# Patient Record
Sex: Female | Born: 1945
Health system: Southern US, Community
[De-identification: ages and names within clinical notes are randomized; demographics above are authoritative.]

## PROBLEM LIST (undated history)

## (undated) DIAGNOSIS — E785 Hyperlipidemia, unspecified: Secondary | ICD-10-CM

## (undated) DIAGNOSIS — I10 Essential (primary) hypertension: Secondary | ICD-10-CM

## (undated) DIAGNOSIS — E119 Type 2 diabetes mellitus without complications: Secondary | ICD-10-CM

## (undated) HISTORY — DX: Type 2 diabetes mellitus without complications: E11.9

## (undated) HISTORY — PX: APPENDECTOMY: SHX54

## (undated) HISTORY — DX: Hyperlipidemia, unspecified: E78.5

## (undated) HISTORY — DX: Essential (primary) hypertension: I10

---

## 1992-10-27 HISTORY — PX: ABDOMINAL HYSTERECTOMY: SHX81

## 2003-10-28 HISTORY — PX: LITHOTRIPSY: SUR834

## 2005-10-27 LAB — HM COLONOSCOPY: HM Colonoscopy: NORMAL

## 2008-10-27 HISTORY — PX: PACEMAKER INSERTION: SHX728

## 2009-10-11 DIAGNOSIS — Z950 Presence of cardiac pacemaker: Secondary | ICD-10-CM | POA: Insufficient documentation

## 2009-10-27 HISTORY — PX: CATARACT EXTRACTION: SUR2

## 2014-02-24 LAB — HM DIABETES EYE EXAM

## 2014-06-27 LAB — HM MAMMOGRAPHY: HM Mammogram: NORMAL

## 2015-01-15 LAB — HEMOGLOBIN A1C: Hgb A1c MFr Bld: 7.4 % — AB (ref 4.0–6.0)

## 2015-04-12 DIAGNOSIS — I499 Cardiac arrhythmia, unspecified: Secondary | ICD-10-CM | POA: Insufficient documentation

## 2015-04-12 DIAGNOSIS — I10 Essential (primary) hypertension: Secondary | ICD-10-CM | POA: Insufficient documentation

## 2015-04-12 DIAGNOSIS — E1159 Type 2 diabetes mellitus with other circulatory complications: Secondary | ICD-10-CM | POA: Insufficient documentation

## 2015-04-12 DIAGNOSIS — E785 Hyperlipidemia, unspecified: Secondary | ICD-10-CM

## 2015-04-12 DIAGNOSIS — E118 Type 2 diabetes mellitus with unspecified complications: Secondary | ICD-10-CM | POA: Insufficient documentation

## 2015-04-12 DIAGNOSIS — E1169 Type 2 diabetes mellitus with other specified complication: Secondary | ICD-10-CM | POA: Insufficient documentation

## 2015-04-12 DIAGNOSIS — Z8542 Personal history of malignant neoplasm of other parts of uterus: Secondary | ICD-10-CM | POA: Insufficient documentation

## 2015-04-26 ENCOUNTER — Other Ambulatory Visit: Payer: Self-pay | Admitting: Internal Medicine

## 2015-05-17 ENCOUNTER — Encounter: Payer: Self-pay | Admitting: Internal Medicine

## 2015-05-17 ENCOUNTER — Ambulatory Visit (INDEPENDENT_AMBULATORY_CARE_PROVIDER_SITE_OTHER): Payer: BLUE CROSS/BLUE SHIELD | Admitting: Internal Medicine

## 2015-05-17 VITALS — BP 128/64 | HR 68 | Ht 69.0 in | Wt 215.8 lb

## 2015-05-17 DIAGNOSIS — I471 Supraventricular tachycardia: Secondary | ICD-10-CM | POA: Diagnosis not present

## 2015-05-17 DIAGNOSIS — IMO0002 Reserved for concepts with insufficient information to code with codable children: Secondary | ICD-10-CM

## 2015-05-17 DIAGNOSIS — D485 Neoplasm of uncertain behavior of skin: Secondary | ICD-10-CM | POA: Diagnosis not present

## 2015-05-17 DIAGNOSIS — Z9889 Other specified postprocedural states: Secondary | ICD-10-CM | POA: Insufficient documentation

## 2015-05-17 DIAGNOSIS — I10 Essential (primary) hypertension: Secondary | ICD-10-CM

## 2015-05-17 DIAGNOSIS — E1165 Type 2 diabetes mellitus with hyperglycemia: Secondary | ICD-10-CM | POA: Diagnosis not present

## 2015-05-17 DIAGNOSIS — R9439 Abnormal result of other cardiovascular function study: Secondary | ICD-10-CM | POA: Insufficient documentation

## 2015-05-17 DIAGNOSIS — R001 Bradycardia, unspecified: Secondary | ICD-10-CM | POA: Insufficient documentation

## 2015-05-17 NOTE — Progress Notes (Signed)
Date:  05/17/2015   Name:  Kimberly Salazar   DOB:  1945-11-14   MRN:  [id number]   Chief Complaint: Diabetes Diabetes She presents for her follow-up diabetic visit. She has type 2 diabetes mellitus. Her disease course has been stable. There are no hypoglycemic associated symptoms. Pertinent negatives for diabetes include no chest pain. Symptoms are stable. There are no diabetic complications. She is compliant with treatment most of the time. Her weight is decreasing steadily. She is following a diabetic diet. (Admits to not checking her blood sugar) Eye exam is current.  Hypertension This is a chronic problem. The current episode started more than 1 year ago. The problem is unchanged. The problem is controlled. Pertinent negatives include no chest pain, palpitations or shortness of breath. Risk factors for coronary artery disease include diabetes mellitus and dyslipidemia. Past treatments include beta blockers, angiotensin blockers and diuretics. Hypertensive end-organ damage includes CAD/MI (arrythmia). There is no history of kidney disease.  Rash This is a chronic problem. The current episode started more than 1 month ago. The problem is unchanged (lesion on back of right leg). The rash is characterized by dryness and scaling. Pertinent negatives include no cough, fever or shortness of breath.  She has not had a dermatology evaluation in some time.  She has seen Dr. Magda Salazar in [place] in the past.   Review of Systems:  Review of Systems  Constitutional: Negative for fever, activity change, appetite change and unexpected weight change.  HENT: Negative for hearing loss and trouble swallowing.   Eyes: Negative for visual disturbance.  Respiratory: Negative for cough, chest tightness and shortness of breath.   Cardiovascular: Negative for chest pain, palpitations and leg swelling.  Genitourinary: Negative for hematuria and difficulty urinating.  Musculoskeletal: Negative for joint swelling.  Skin:  Positive for rash.  Neurological: Negative for numbness.    Patient Active Problem List   Diagnosis Date Noted  . Bradycardia 05/17/2015  . Abnormal finding on thallium stress test 05/17/2015  . History of biliary T-tube placement 05/17/2015  . Supraventricular tachycardia 05/17/2015  . Hypercholesteremia 04/12/2015  . Essential (primary) hypertension 04/12/2015  . Cancer of uterus 04/12/2015  . Arrhythmia, sinus node 04/12/2015  . Diabetes mellitus type 2, uncontrolled 04/12/2015    Prior to Admission medications   Medication Sig Start Date End Date Taking? Authorizing Provider  aspirin 81 MG chewable tablet Chew 1 tablet by mouth daily.   Yes Historical Provider, MD  glimepiride (AMARYL) 2 MG tablet Take 1 tablet by mouth 2 (two) times daily. 03/01/15  Yes Historical Provider, MD  glucose blood (ONE TOUCH ULTRA TEST) test strip 1 strip 2 (two) times daily. 10/12/14  Yes Historical Provider, MD  metoprolol succinate (TOPROL-XL) 25 MG 24 hr tablet Take 1 tablet by mouth daily. 09/15/14  Yes Historical Provider, MD  Multiple Vitamins-Minerals (MULTIVITAL) tablet Take 1 tablet by mouth daily.   Yes Historical Provider, MD  olmesartan-hydrochlorothiazide (BENICAR HCT) 20-12.5 MG per tablet Take 1 tablet by mouth daily. 09/15/14  Yes Historical Provider, MD  omega-3 acid ethyl esters (LOVAZA) 1 G capsule TAKE 4 CAPSULES BY MOUTH DAILY 04/26/15  Yes Kimberly Hess, MD  simvastatin (ZOCOR) 80 MG tablet Take 1 tablet by mouth daily. 09/15/14  Yes Historical Provider, MD  SitaGLIPtin-MetFORMIN HCl (JANUMET XR) [phone] MG TB24 Take 1 tablet by mouth daily. 09/15/14  Yes Historical Provider, MD    No Known Allergies  Past Surgical History  Procedure Laterality Date  . Pacemaker insertion  2010  .  Abdominal hysterectomy  1994    Uterine CA  . Lithotripsy  2005  . Appendectomy    . Cataract extraction Left 2011    History  Substance Use Topics  . Smoking status: Never Smoker   .  Smokeless tobacco: Not on file  . Alcohol Use: No     Medication list has been reviewed and updated.  Physical Examination:  Physical Exam  Constitutional: She appears well-developed and well-nourished.  Neck: Normal range of motion. Neck supple. No thyromegaly present.  Cardiovascular: Normal rate, regular rhythm and normal heart sounds.   Pulmonary/Chest: Effort normal. She has no wheezes.  Musculoskeletal: She exhibits no edema or tenderness.  Lymphadenopathy:    She has no cervical adenopathy.  Skin:       BP 128/64 mmHg  Pulse 68  Ht 5\' 9"  (1.753 m)  Wt 215 lb 12.8 oz (97.886 kg)  BMI 31.85 kg/m2  Assessment and Plan: 1. Diabetes mellitus type 2, uncontrolled Improved - continue diet and exercise - Hemoglobin A1c  2. Essential (primary) hypertension Stable on current medication  3. Supraventricular tachycardia Pacemaker; followed by [facility] - Dr Kimberly Salazar  4. Neoplasm of uncertain behavior of skin Recommend Dermatology evaluation   Kimberly Maidens, MD [facility]  05/17/2015

## 2015-05-18 LAB — HEMOGLOBIN A1C
Est. average glucose Bld gHb Est-mCnc: 163 mg/dL
Hgb A1c MFr Bld: 7.3 % — ABNORMAL HIGH (ref 4.8–5.6)

## 2015-08-08 ENCOUNTER — Other Ambulatory Visit: Payer: Self-pay | Admitting: Internal Medicine

## 2015-09-28 ENCOUNTER — Other Ambulatory Visit: Payer: Self-pay | Admitting: Internal Medicine

## 2015-11-19 ENCOUNTER — Ambulatory Visit (INDEPENDENT_AMBULATORY_CARE_PROVIDER_SITE_OTHER): Payer: BLUE CROSS/BLUE SHIELD | Admitting: Internal Medicine

## 2015-11-19 ENCOUNTER — Encounter: Payer: Self-pay | Admitting: Internal Medicine

## 2015-11-19 VITALS — BP 140/90 | HR 64 | Ht 68.0 in | Wt 220.6 lb

## 2015-11-19 DIAGNOSIS — Z1159 Encounter for screening for other viral diseases: Secondary | ICD-10-CM | POA: Diagnosis not present

## 2015-11-19 DIAGNOSIS — Z0000 Encounter for general adult medical examination without abnormal findings: Secondary | ICD-10-CM

## 2015-11-19 DIAGNOSIS — E7800 Pure hypercholesterolemia, unspecified: Secondary | ICD-10-CM | POA: Diagnosis not present

## 2015-11-19 DIAGNOSIS — I10 Essential (primary) hypertension: Secondary | ICD-10-CM | POA: Diagnosis not present

## 2015-11-19 DIAGNOSIS — E1165 Type 2 diabetes mellitus with hyperglycemia: Secondary | ICD-10-CM

## 2015-11-19 DIAGNOSIS — Z23 Encounter for immunization: Secondary | ICD-10-CM | POA: Diagnosis not present

## 2015-11-19 DIAGNOSIS — S336XXA Sprain of sacroiliac joint, initial encounter: Secondary | ICD-10-CM

## 2015-11-19 DIAGNOSIS — IMO0001 Reserved for inherently not codable concepts without codable children: Secondary | ICD-10-CM

## 2015-11-19 LAB — POCT URINALYSIS DIPSTICK
Bilirubin, UA: NEGATIVE
Blood, UA: NEGATIVE
GLUCOSE UA: 1000
KETONES UA: NEGATIVE
LEUKOCYTES UA: NEGATIVE
Nitrite, UA: NEGATIVE
Protein, UA: 100
SPEC GRAV UA: 1.01
Urobilinogen, UA: 0.2
pH, UA: 6

## 2015-11-19 NOTE — Progress Notes (Signed)
Date:  11/19/2015   Name:  Kimberly Salazar   DOB:  1945-12-16   MRN:  [id number]   Chief Complaint: Annual Exam; Hypertension; Diabetes; and Hyperlipidemia Kimberly Salazar is a 70 y.o. female who presents today for her Complete Annual Exam. She feels well. She reports exercising regularly. She reports she is sleeping well.   Hypertension This is a chronic problem. The current episode started more than 1 year ago. The problem is unchanged. The problem is controlled. Pertinent negatives include no chest pain, headaches, palpitations or shortness of breath. Past treatments include angiotensin blockers, diuretics and beta blockers. The current treatment provides significant improvement.  Diabetes She presents for her follow-up diabetic visit. She has type 2 diabetes mellitus. Her disease course has been stable. There are no hypoglycemic associated symptoms. Pertinent negatives for hypoglycemia include no dizziness, headaches or tremors. Pertinent negatives for diabetes include no chest pain, no fatigue, no polydipsia and no polyuria. Symptoms are stable. Current diabetic treatment includes oral agent (triple therapy). She is compliant with treatment all of the time. An ACE inhibitor/angiotensin II receptor blocker is being taken. Eye exam is not current.  Hyperlipidemia This is a chronic problem. The current episode started more than 1 year ago. Pertinent negatives include no chest pain, myalgias or shortness of breath.  low back discomfort - patient complains of low back and right posterior hip discomfort. She has discomfort primarily with twisting or turning over in bed. Does not affect her ability to walk. She's had no numbness or tingling in her leg or foot. She cannot recall any injury. She has tried no treatment.   Review of Systems  Constitutional: Negative for fever, chills and fatigue.  HENT: Negative for dental problem, hearing loss, tinnitus and trouble swallowing.   Eyes: Negative for  visual disturbance.  Respiratory: Negative for cough, chest tightness and shortness of breath.   Cardiovascular: Negative for chest pain, palpitations and leg swelling.  Gastrointestinal: Negative for diarrhea, constipation and blood in stool.  Endocrine: Negative for polydipsia and polyuria.  Genitourinary: Negative for dysuria, urgency, hematuria and vaginal bleeding.  Musculoskeletal: Positive for arthralgias (right hip). Negative for myalgias and joint swelling.  Skin: Negative for color change and rash.  Neurological: Negative for dizziness, tremors, numbness and headaches.  Hematological: Negative for adenopathy. Does not bruise/bleed easily.  Psychiatric/Behavioral: Negative for sleep disturbance and dysphoric mood.    Patient Active Problem List   Diagnosis Date Noted  . Bradycardia 05/17/2015  . Abnormal finding on thallium stress test 05/17/2015  . Supraventricular tachycardia ([place]) 05/17/2015  . Hypercholesteremia 04/12/2015  . Essential (primary) hypertension 04/12/2015  . History of uterine cancer 04/12/2015  . Arrhythmia, sinus node ([place]) 04/12/2015  . Diabetes mellitus type 2, uncontrolled ([place]) 04/12/2015    Prior to Admission medications   Medication Sig Start Date End Date Taking? Authorizing Provider  aspirin 81 MG chewable tablet Chew 1 tablet by mouth daily.   Yes Historical Provider, MD  BENICAR HCT 20-12.5 MG tablet TAKE 1 TABLET BY MOUTH EVERY DAY 09/28/15  Yes Glean Hess, MD  glimepiride (AMARYL) 2 MG tablet 1 (ONE) TABLET TABLET, ORAL, TWO TIMES DAILY BEFORE MEALS 08/08/15  Yes Glean Hess, MD  glucose blood (ONE TOUCH ULTRA TEST) test strip 1 strip 2 (two) times daily. 10/12/14  Yes Historical Provider, MD  JANUMET XR [phone] MG TB24 TAKE 1 TABLET BY MOUTH EVERY DAY 09/28/15  Yes Glean Hess, MD  metoprolol succinate (TOPROL-XL) 25 MG 24 hr  tablet TAKE 1 TABLET BY MOUTH EVERY DAY 09/28/15  Yes Glean Hess, MD  Multiple Vitamins-Minerals  (MULTIVITAL) tablet Take 1 tablet by mouth daily.   Yes Historical Provider, MD  omega-3 acid ethyl esters (LOVAZA) 1 G capsule TAKE 4 CAPSULES BY MOUTH DAILY 04/26/15  Yes Glean Hess, MD  simvastatin (ZOCOR) 80 MG tablet TAKE 1 TABLET BY MOUTH EVERY DAY 09/28/15  Yes Glean Hess, MD    No Known Allergies  Past Surgical History  Procedure Laterality Date  . Pacemaker insertion  2010  . Abdominal hysterectomy  1994    Uterine CA  . Lithotripsy  2005  . Appendectomy    . Cataract extraction Left 2011    Social History  Substance Use Topics  . Smoking status: Never Smoker   . Smokeless tobacco: None  . Alcohol Use: No     Medication list has been reviewed and updated.   Physical Exam  Constitutional: She is oriented to person, place, and time. She appears well-developed and well-nourished. No distress.  HENT:  Head: Normocephalic and atraumatic.  Right Ear: Tympanic membrane and ear canal normal.  Left Ear: Tympanic membrane and ear canal normal.  Nose: Right sinus exhibits no maxillary sinus tenderness. Left sinus exhibits no maxillary sinus tenderness.  Mouth/Throat: Uvula is midline and oropharynx is clear and moist.  Eyes: Conjunctivae and EOM are normal. Right eye exhibits no discharge. Left eye exhibits no discharge. No scleral icterus.  Neck: Normal range of motion. Carotid bruit is not present. No erythema present. No thyromegaly present.  Cardiovascular: Normal rate, regular rhythm, normal heart sounds and normal pulses.   Pulmonary/Chest: Effort normal. No respiratory distress. She has no wheezes. Right breast exhibits no mass, no nipple discharge, no skin change and no tenderness. Left breast exhibits no mass, no nipple discharge, no skin change and no tenderness.  Abdominal: Soft. Bowel sounds are normal. There is no hepatosplenomegaly. There is no tenderness. There is no CVA tenderness.  Musculoskeletal: Normal range of motion.       Right hip: Normal.        Right knee: Normal.       Lumbar back: She exhibits no tenderness and no bony tenderness.  Discomfort located over right SI region - no tenderness to palpation/no spasm  Lymphadenopathy:    She has no cervical adenopathy.    She has no axillary adenopathy.  Neurological: She is alert and oriented to person, place, and time. She has normal reflexes. No cranial nerve deficit or sensory deficit.  Skin: Skin is warm, dry and intact. No rash noted.  Psychiatric: She has a normal mood and affect. Her speech is normal and behavior is normal. Thought content normal.  Nursing note and vitals reviewed.   BP 140/90 mmHg  Pulse 64  Ht 5\' 8"  (1.727 m)  Wt 220 lb 9.6 oz (100.064 kg)  BMI 33.55 kg/m2  Assessment and Plan: 1. Annual physical exam Patient will schedule her mammogram at [facility] She will also schedule her 10 yr colonoscopy with Dr. Epimenio Foot - POCT Urinalysis Dipstick  2. Essential (primary) hypertension controlled - CBC with Differential/Platelet - Comprehensive metabolic panel  3. Uncontrolled type 2 diabetes mellitus without complication, without long-term current use of insulin (HCC) On triple oral therapy - continue monitoring daily and as needed Foot exam normal Continue dietary modifications Pt will schedule yearly Eye exam - Hemoglobin A1c - TSH - Microalbumin / creatinine urine ratio  4. Hypercholesteremia On  statin therapy - Lipid panel  5. Need for hepatitis C screening test - Hepatitis C antibody  6. Need for influenza vaccination - Flu Vaccine QUAD 36+ mos IM  7. Strain of sacroiliac ligament, initial encounter Tylenol 650 mg qhs Heat 20 minutes twice a day  Halina Maidens, MD [facility]  11/19/2015

## 2015-11-19 NOTE — Patient Instructions (Signed)
Breast Self-Awareness Practicing breast self-awareness may pick up problems early, prevent significant medical complications, and possibly save your life. By practicing breast self-awareness, you can become familiar with how your breasts look and feel and if your breasts are changing. This allows you to notice changes early. It can also offer you some reassurance that your breast health is good. One way to learn what is normal for your breasts and whether your breasts are changing is to do a breast self-exam. If you find a lump or something that was not present in the past, it is best to contact your caregiver right away. Other findings that should be evaluated by your caregiver include nipple discharge, especially if it is bloody; skin changes or reddening; areas where the skin seems to be pulled in (retracted); or new lumps and bumps. Breast pain is seldom associated with cancer (malignancy), but should also be evaluated by a caregiver. HOW TO PERFORM A BREAST SELF-EXAM The best time to examine your breasts is 5-7 days after your menstrual period is over. During menstruation, the breasts are lumpier, and it may be more difficult to pick up changes. If you do not menstruate, have reached menopause, or had your uterus removed (hysterectomy), you should examine your breasts at regular intervals, such as monthly. If you are breastfeeding, examine your breasts after a feeding or after using a breast pump. Breast implants do not decrease the risk for lumps or tumors, so continue to perform breast self-exams as recommended. Talk to your caregiver about how to determine the difference between the implant and breast tissue. Also, talk about the amount of pressure you should use during the exam. Over time, you will become more familiar with the variations of your breasts and more comfortable with the exam. A breast self-exam requires you to remove all your clothes above the waist. 1. Look at your breasts and nipples.  Stand in front of a mirror in a room with good lighting. With your hands on your hips, push your hands firmly downward. Look for a difference in shape, contour, and size from one breast to the other (asymmetry). Asymmetry includes puckers, dips, or bumps. Also, look for skin changes, such as reddened or scaly areas on the breasts. Look for nipple changes, such as discharge, dimpling, repositioning, or redness. 2. Carefully feel your breasts. This is best done either in the shower or tub while using soapy water or when flat on your back. Place the arm (on the side of the breast you are examining) above your head. Use the pads (not the fingertips) of your three middle fingers on your opposite hand to feel your breasts. Start in the underarm area and use  inch (2 cm) overlapping circles to feel your breast. Use 3 different levels of pressure (light, medium, and firm pressure) at each circle before moving to the next circle. The light pressure is needed to feel the tissue closest to the skin. The medium pressure will help to feel breast tissue a little deeper, while the firm pressure is needed to feel the tissue close to the ribs. Continue the overlapping circles, moving downward over the breast until you feel your ribs below your breast. Then, move one finger-width towards the center of the body. Continue to use the  inch (2 cm) overlapping circles to feel your breast as you move slowly up toward the collar bone (clavicle) near the base of the neck. Continue the up and down exam using all 3 pressures until you reach the   middle of the chest. Do this with each breast, carefully feeling for lumps or changes. 3.  Keep a written record with breast changes or normal findings for each breast. By writing this information down, you do not need to depend only on memory for size, tenderness, or location. Write down where you are in your menstrual cycle, if you are still menstruating. Breast tissue can have some lumps or  thick tissue. However, see your caregiver if you find anything that concerns you.  SEEK MEDICAL CARE IF:  You see a change in shape, contour, or size of your breasts or nipples.   You see skin changes, such as reddened or scaly areas on the breasts or nipples.   You have an unusual discharge from your nipples.   You feel a new lump or unusually thick areas.    This information is not intended to replace advice given to you by your health care provider. Make sure you discuss any questions you have with your health care provider.   Document Released: 10/13/2005 Document Revised: 09/29/2012 Document Reviewed: 01/28/2012 Elsevier Interactive Patient Education 2016 [employer]. Diabetic Retinopathy Diabetic retinopathy is a disease of the light-sensitive membrane at the back of the eye (retina). It is a complication of diabetes and a common cause of blindness. Early detection of the disease is key to keeping your eyes healthy.  CAUSES  Diabetic retinopathy is caused by blood sugar (glucose) levels that are too high over an extended period of time. High blood sugars cause damage to the small blood vessels of the retina, allowing blood to leak through the vessel walls. This causes visual impairment and eventually blindness. RISK FACTORS 4. High blood pressure. 5. Having diabetes for a long time. 6. Having poorly controlled blood sugars. SIGNS AND SYMPTOMS  In the early stages of diabetic retinopathy, there are often no symptoms. As the condition advances, symptoms may include:  Blurred vision. This is usually caused by a swelling due to abnormal blood glucose levels. The blurriness may go away when blood glucose levels return to normal.  Moving specks or dark spots (floaters) in your vision. These can be caused by a small retinal hemorrhage. A hemorrhage is bleeding from blood vessels.  Missing parts of your field of vision, such as things at the side. This can be caused by larger  retinal hemorrhages.  Difficulty reading books or signs.  Double vision.  Pain in one or both eyes.  Feeling pressure in one or both eyes.  Trouble seeing straight lines. Straight lines do not look straight.  Redness of the eyes that does not go away. DIAGNOSIS  Your eye care specialist can detect changes in the blood vessels of your eye by putting drops in your eyes that enlarge (dilate) your pupils. This allows your eye care specialist to get a good look at your retina to see if there are any changes that have occurred as a result of your diabetes. You should have your eyes examined once a year. TREATMENT  Your eye care specialist may use a special laser beam to seal the blood vessels of the retina and stop them from leaking. Early detection and treatment are important so that further damage to your eyes can be prevented. In addition, managing your blood sugars and keeping them in the target range can slow the progress of the disease. HOME CARE INSTRUCTIONS   Keep your blood pressure within your target range.  Keep your blood glucose levels within your target range.  Follow  your health care provider's instructions regarding diet and other means for controlling your blood glucose levels.  Check your blood levels for glucose as recommended by your health care provider.  Keep regular appointments with your eye specialist. An eye specialist can usually see diabetic retinopathy developing long before it starts causing problems. In many cases, it can be treated to prevent complications from occurring. If you have diabetes, you should have your eyes checked at least every year. Your risk of retinopathy increases the longer you have the disease.  If you smoke, quit. Ask your health care provider for help if needed. Smoking can make retinopathy worse. SEEK MEDICAL CARE IF:   You notice gradual blurring or other changes in your vision over time.  You notice that your glasses or contact  lenses do not make things look as sharp as they once did.  You have trouble reading or seeing details at a distance with either eye.  You notice a sudden change in your vision or notice that parts of your field of vision appear missing or hazy.  You suddenly see moving specks or dark spots in the field of vision of either eye.  You have sudden partial loss of vision in either eye.   This information is not intended to replace advice given to you by your health care provider. Make sure you discuss any questions you have with your health care provider.   Document Released: 10/10/2000 Document Revised: 08/03/2013 Document Reviewed: 04/04/2013 Elsevier Interactive Patient Education [employer].

## 2015-11-20 ENCOUNTER — Other Ambulatory Visit: Payer: Self-pay | Admitting: Internal Medicine

## 2015-11-20 ENCOUNTER — Telehealth: Payer: Self-pay

## 2015-11-20 LAB — CBC WITH DIFFERENTIAL/PLATELET
Basophils Absolute: 0 10*3/uL (ref 0.0–0.2)
Basos: 1 %
EOS (ABSOLUTE): 0.1 10*3/uL (ref 0.0–0.4)
EOS: 2 %
HEMATOCRIT: 40.2 % (ref 34.0–46.6)
HEMOGLOBIN: 13.6 g/dL (ref 11.1–15.9)
IMMATURE GRANS (ABS): 0 10*3/uL (ref 0.0–0.1)
Immature Granulocytes: 0 %
LYMPHS ABS: 1.5 10*3/uL (ref 0.7–3.1)
LYMPHS: 28 %
MCH: 30.3 pg (ref 26.6–33.0)
MCHC: 33.8 g/dL (ref 31.5–35.7)
MCV: 90 fL (ref 79–97)
MONOCYTES: 6 %
Monocytes Absolute: 0.3 10*3/uL (ref 0.1–0.9)
NEUTROS ABS: 3.5 10*3/uL (ref 1.4–7.0)
Neutrophils: 63 %
Platelets: 154 10*3/uL (ref 150–379)
RBC: 4.49 x10E6/uL (ref 3.77–5.28)
RDW: 13.1 % (ref 12.3–15.4)
WBC: 5.5 10*3/uL (ref 3.4–10.8)

## 2015-11-20 LAB — TSH: TSH: 2.77 u[IU]/mL (ref 0.450–4.500)

## 2015-11-20 LAB — COMPREHENSIVE METABOLIC PANEL
ALBUMIN: 4.5 g/dL (ref 3.5–4.8)
ALK PHOS: 61 IU/L (ref 39–117)
ALT: 36 IU/L — ABNORMAL HIGH (ref 0–32)
AST: 28 IU/L (ref 0–40)
Albumin/Globulin Ratio: 1.8 (ref 1.1–2.5)
BILIRUBIN TOTAL: 0.5 mg/dL (ref 0.0–1.2)
BUN / CREAT RATIO: 20 (ref 11–26)
BUN: 19 mg/dL (ref 8–27)
CO2: 22 mmol/L (ref 18–29)
CREATININE: 0.94 mg/dL (ref 0.57–1.00)
Calcium: 9.5 mg/dL (ref 8.7–10.3)
Chloride: 99 mmol/L (ref 96–106)
GFR calc non Af Amer: 62 mL/min/{1.73_m2} (ref >59)
GFR, EST AFRICAN AMERICAN: 71 mL/min/{1.73_m2} (ref >59)
GLOBULIN, TOTAL: 2.5 g/dL (ref 1.5–4.5)
GLUCOSE: 277 mg/dL — AB (ref 65–99)
Potassium: 4.7 mmol/L (ref 3.5–5.2)
SODIUM: 139 mmol/L (ref 134–144)
TOTAL PROTEIN: 7 g/dL (ref 6.0–8.5)

## 2015-11-20 LAB — LIPID PANEL
CHOL/HDL RATIO: 2.9 ratio (ref 0.0–4.4)
CHOLESTEROL TOTAL: 158 mg/dL (ref 100–199)
HDL: 54 mg/dL (ref >39)
LDL CALC: 58 mg/dL (ref 0–99)
TRIGLYCERIDES: 232 mg/dL — AB (ref 0–149)
VLDL CHOLESTEROL CAL: 46 mg/dL — AB (ref 5–40)

## 2015-11-20 LAB — HEMOGLOBIN A1C
Est. average glucose Bld gHb Est-mCnc: 200 mg/dL
HEMOGLOBIN A1C: 8.6 % — AB (ref 4.8–5.6)

## 2015-11-20 LAB — MICROALBUMIN / CREATININE URINE RATIO
Creatinine, Urine: 201.6 mg/dL
MICROALB/CREAT RATIO: 30.7 mg/g creat — ABNORMAL HIGH (ref 0.0–30.0)
MICROALBUM., U, RANDOM: 61.8 ug/mL

## 2015-11-20 LAB — HEPATITIS C ANTIBODY: Hep C Virus Ab: <0.1 s/co ratio (ref 0.0–0.9)

## 2015-11-20 MED ORDER — SITAGLIP PHOS-METFORMIN HCL ER 50-1000 MG PO TB24: 2.0000 | ORAL_TABLET | Freq: Every day | ORAL | Status: DC

## 2015-11-20 NOTE — Telephone Encounter (Signed)
Left message for patient to call back  

## 2015-11-20 NOTE — Telephone Encounter (Signed)
-----   Message from Glean Hess, MD sent at 11/20/2015  1:17 PM EST ----- DM is much worse.  I recommend increasing Janumet to 50/1000 mg 2 per day (this in an increase in the metformin dose but keeps [place] the same at 100 mg).  All other labs are normal.  I will send a new Rx to the pharmacy.

## 2015-11-23 NOTE — Telephone Encounter (Signed)
Spoke with pt. Pt. Advised of results and verbalized understanding.  

## 2015-12-04 ENCOUNTER — Other Ambulatory Visit: Payer: Self-pay | Admitting: Internal Medicine

## 2015-12-15 ENCOUNTER — Other Ambulatory Visit: Payer: Self-pay | Admitting: Internal Medicine

## 2016-01-04 ENCOUNTER — Other Ambulatory Visit: Payer: Self-pay | Admitting: Internal Medicine

## 2016-03-20 ENCOUNTER — Other Ambulatory Visit: Payer: Self-pay | Admitting: Internal Medicine

## 2016-04-04 ENCOUNTER — Other Ambulatory Visit: Payer: Self-pay | Admitting: Internal Medicine

## 2016-05-30 ENCOUNTER — Other Ambulatory Visit: Payer: Self-pay | Admitting: Internal Medicine

## 2016-07-08 ENCOUNTER — Other Ambulatory Visit: Payer: Self-pay | Admitting: Internal Medicine

## 2016-07-10 NOTE — Telephone Encounter (Signed)
Pt is scheduled for 9/19 @ 815

## 2016-07-12 ENCOUNTER — Other Ambulatory Visit: Payer: Self-pay | Admitting: Internal Medicine

## 2016-07-15 ENCOUNTER — Ambulatory Visit (INDEPENDENT_AMBULATORY_CARE_PROVIDER_SITE_OTHER): Payer: BLUE CROSS/BLUE SHIELD | Admitting: Internal Medicine

## 2016-07-15 ENCOUNTER — Encounter: Payer: Self-pay | Admitting: Internal Medicine

## 2016-07-15 VITALS — BP 144/80 | HR 70 | Resp 14 | Wt 213.6 lb

## 2016-07-15 DIAGNOSIS — Z23 Encounter for immunization: Secondary | ICD-10-CM | POA: Diagnosis not present

## 2016-07-15 DIAGNOSIS — E1165 Type 2 diabetes mellitus with hyperglycemia: Secondary | ICD-10-CM | POA: Diagnosis not present

## 2016-07-15 DIAGNOSIS — I10 Essential (primary) hypertension: Secondary | ICD-10-CM

## 2016-07-15 DIAGNOSIS — C44711 Basal cell carcinoma of skin of unspecified lower limb, including hip: Secondary | ICD-10-CM | POA: Insufficient documentation

## 2016-07-15 DIAGNOSIS — IMO0001 Reserved for inherently not codable concepts without codable children: Secondary | ICD-10-CM

## 2016-07-15 DIAGNOSIS — E7800 Pure hypercholesterolemia, unspecified: Secondary | ICD-10-CM | POA: Diagnosis not present

## 2016-07-15 DIAGNOSIS — I471 Supraventricular tachycardia: Secondary | ICD-10-CM

## 2016-07-15 DIAGNOSIS — C44712 Basal cell carcinoma of skin of right lower limb, including hip: Secondary | ICD-10-CM

## 2016-07-15 MED ORDER — SITAGLIP PHOS-METFORMIN HCL ER 50-1000 MG PO TB24: 2.0000 | ORAL_TABLET | Freq: Every day | ORAL | 12 refills | Status: DC

## 2016-07-15 NOTE — Progress Notes (Signed)
Date:  07/15/2016   Name:  Kimberly Salazar   DOB:  1946-03-09   MRN:  [id number]   Chief Complaint: Diabetes and Hypertension Diabetes  She presents for her follow-up diabetic visit. She has type 2 diabetes mellitus. Pertinent negatives for hypoglycemia include no headaches or tremors. Pertinent negatives for diabetes include no chest pain, no fatigue, no polydipsia and no polyuria. Current diabetic treatment includes oral agent (triple therapy) (dose of janumet increased last visit). Blood glucose monitoring compliance is inadequate (only once per month).  Hypertension  This is a chronic problem. The current episode started more than 1 year ago. The problem is unchanged. The problem is controlled. Pertinent negatives include no chest pain, headaches, palpitations or shortness of breath.   Bradycardia -  Still has pacemaker and doing well. Not exercising regularly but trying to walk.  BCCA - removed from right leg recently.  Followed by Dr. Magda Kiel.  Review of Systems  Constitutional: Negative for appetite change, fatigue, fever and unexpected weight change.  HENT: Negative for tinnitus and trouble swallowing.   Eyes: Negative for visual disturbance.  Respiratory: Negative for cough, chest tightness and shortness of breath.   Cardiovascular: Negative for chest pain, palpitations and leg swelling.  Gastrointestinal: Negative for abdominal pain.  Endocrine: Negative for polydipsia and polyuria.  Genitourinary: Negative for dysuria and hematuria.  Musculoskeletal: Negative for arthralgias.  Skin: Negative for color change and rash.  Neurological: Negative for tremors, numbness and headaches.  Hematological: Negative for adenopathy.  Psychiatric/Behavioral: Negative for dysphoric mood.    Patient Active Problem List   Diagnosis Date Noted  . Bradycardia 05/17/2015  . Abnormal finding on thallium stress test 05/17/2015  . Supraventricular tachycardia ([place]) 05/17/2015  .  Hypercholesteremia 04/12/2015  . Essential (primary) hypertension 04/12/2015  . History of uterine cancer 04/12/2015  . Arrhythmia, sinus node ([place]) 04/12/2015  . Uncontrolled type 2 diabetes mellitus without complication, without long-term current use of insulin ([place]) 04/12/2015    Prior to Admission medications   Medication Sig Start Date End Date Taking? Authorizing Provider  aspirin 81 MG chewable tablet Chew 1 tablet by mouth daily.   Yes Historical Provider, MD  BENICAR HCT 20-12.5 MG tablet TAKE 1 TABLET BY MOUTH EVERY DAY 12/16/15  Yes Glean Hess, MD  glimepiride (AMARYL) 2 MG tablet TAKE 1 TAB 2 TIMES A DAY BEFORE MEALS 01/04/16  Yes Glean Hess, MD  JANUMET XR 50-1000 MG TB24 TAKE 2 TABLETS BY MOUTH DAILY. 07/09/16  Yes Glean Hess, MD  metoprolol succinate (TOPROL-XL) 25 MG 24 hr tablet TAKE 1 TABLET BY MOUTH EVERY DAY 04/04/16  Yes Glean Hess, MD  omega-3 acid ethyl esters (LOVAZA) 1 g capsule TAKE 4 CAPSULES BY MOUTH DAILY 03/20/16  Yes Glean Hess, MD  simvastatin (ZOCOR) 80 MG tablet TAKE 1 TABLET BY MOUTH EVERY DAY 04/04/16  Yes Glean Hess, MD  glucose blood (ONE TOUCH ULTRA TEST) test strip 1 strip 2 (two) times daily. 10/12/14   Historical Provider, MD  Multiple Vitamins-Minerals (MULTIVITAL) tablet Take 1 tablet by mouth daily.    Historical Provider, MD    No Known Allergies  Past Surgical History:  Procedure Laterality Date  . ABDOMINAL HYSTERECTOMY  1994   Uterine CA  . APPENDECTOMY    . CATARACT EXTRACTION Left 2011  . LITHOTRIPSY  2005  . PACEMAKER INSERTION  2010    Social History  Substance Use Topics  . Smoking status: Never Smoker  .  Smokeless tobacco: Not on file  . Alcohol use No     Medication list has been reviewed and updated.   Physical Exam  Constitutional: She is oriented to person, place, and time. She appears well-developed. No distress.  HENT:  Head: Normocephalic and atraumatic.  Neck: Normal range of  motion. Carotid bruit is not present. No thyromegaly present.  Cardiovascular: Normal rate, regular rhythm and normal heart sounds.   Pulmonary/Chest: Effort normal and breath sounds normal. No respiratory distress.  Musculoskeletal: She exhibits no edema or tenderness.  Neurological: She is alert and oriented to person, place, and time.  Skin: Skin is warm and dry. No rash noted.  Psychiatric: She has a normal mood and affect. Her behavior is normal. Thought content normal.  Nursing note and vitals reviewed.   BP (!) 144/80   Pulse 70   Resp 14   Wt 213 lb 9.6 oz (96.9 kg)   BMI 32.48 kg/m   Assessment and Plan: 1. Uncontrolled type 2 diabetes mellitus without complication, without long-term current use of insulin (HCC) Continue current therapy Monitor BS more often - Basic metabolic panel - Hemoglobin A1c - SitaGLIPtin-MetFORMIN HCl (JANUMET XR) 50-1000 MG TB24; Take 2 tablets by mouth daily.  Dispense: 60 tablet; Refill: 12  2. Essential (primary) hypertension controlled  3. Supraventricular tachycardia [facility]) S/p pacemaker followed by Cardiology  4. Basal cell carcinoma, leg, right S/p excision  5. Hypercholesteremia On statin therapy  6. Need for influenza vaccination - Flu Vaccine QUAD 36+ mos IM   Halina Maidens, MD [facility]  07/15/2016

## 2016-07-15 NOTE — Patient Instructions (Signed)
DASH Eating Plan  DASH stands for "Dietary Approaches to Stop Hypertension." The DASH eating plan is a healthy eating plan that has been shown to reduce high blood pressure (hypertension). Additional health benefits may include reducing the risk of type 2 diabetes mellitus, heart disease, and stroke. The DASH eating plan may also help with weight loss.  WHAT DO I NEED TO KNOW ABOUT THE DASH EATING PLAN?  For the DASH eating plan, you will follow these general guidelines:  · Choose foods with a percent daily value for sodium of less than 5% (as listed on the food label).  · Use salt-free seasonings or herbs instead of table salt or sea salt.  · Check with your health care provider or pharmacist before using salt substitutes.  · Eat lower-sodium products, often labeled as "lower sodium" or "no salt added."  · Eat fresh foods.  · Eat more vegetables, fruits, and low-fat dairy products.  · Choose whole grains. Look for the word "whole" as the first word in the ingredient list.  · Choose fish and skinless chicken or turkey more often than red meat. Limit fish, poultry, and meat to 6 oz (170 g) each day.  · Limit sweets, desserts, sugars, and sugary drinks.  · Choose heart-healthy fats.  · Limit cheese to 1 oz (28 g) per day.  · Eat more home-cooked food and less restaurant, buffet, and fast food.  · Limit fried foods.  · Cook foods using methods other than frying.  · Limit canned vegetables. If you do use them, rinse them well to decrease the sodium.  · When eating at a restaurant, ask that your food be prepared with less salt, or no salt if possible.  WHAT FOODS CAN I EAT?  Seek help from a dietitian for individual calorie needs.  Grains  Whole grain or whole wheat bread. Brown rice. Whole grain or whole wheat pasta. Quinoa, bulgur, and whole grain cereals. Low-sodium cereals. Corn or whole wheat flour tortillas. Whole grain cornbread. Whole grain crackers. Low-sodium crackers.  Vegetables  Fresh or frozen vegetables  (raw, steamed, roasted, or grilled). Low-sodium or reduced-sodium tomato and vegetable juices. Low-sodium or reduced-sodium tomato sauce and paste. Low-sodium or reduced-sodium canned vegetables.   Fruits  All fresh, canned (in natural juice), or frozen fruits.  Meat and Other Protein Products  Ground beef (85% or leaner), grass-fed beef, or beef trimmed of fat. Skinless chicken or turkey. Ground chicken or turkey. Pork trimmed of fat. All fish and seafood. Eggs. Dried beans, peas, or lentils. Unsalted nuts and seeds. Unsalted canned beans.  Dairy  Low-fat dairy products, such as skim or 1% milk, 2% or reduced-fat cheeses, low-fat ricotta or cottage cheese, or plain low-fat yogurt. Low-sodium or reduced-sodium cheeses.  Fats and Oils  Tub margarines without trans fats. Light or reduced-fat mayonnaise and salad dressings (reduced sodium). Avocado. Safflower, olive, or canola oils. Natural peanut or almond butter.  Other  Unsalted popcorn and pretzels.  The items listed above may not be a complete list of recommended foods or beverages. Contact your dietitian for more options.  WHAT FOODS ARE NOT RECOMMENDED?  Grains  White bread. White pasta. White rice. Refined cornbread. Bagels and croissants. Crackers that contain trans fat.  Vegetables  Creamed or fried vegetables. Vegetables in a cheese sauce. Regular canned vegetables. Regular canned tomato sauce and paste. Regular tomato and vegetable juices.  Fruits  Dried fruits. Canned fruit in light or heavy syrup. Fruit juice.  Meat and Other Protein   Products  Fatty cuts of meat. Ribs, chicken wings, bacon, sausage, bologna, salami, chitterlings, fatback, hot dogs, bratwurst, and packaged luncheon meats. Salted nuts and seeds. Canned beans with salt.  Dairy  Whole or 2% milk, cream, half-and-half, and cream cheese. Whole-fat or sweetened yogurt. Full-fat cheeses or blue cheese. Nondairy creamers and whipped toppings. Processed cheese, cheese spreads, or cheese  curds.  Condiments  Onion and garlic salt, seasoned salt, table salt, and sea salt. Canned and packaged gravies. Worcestershire sauce. Tartar sauce. Barbecue sauce. Teriyaki sauce. Soy sauce, including reduced sodium. Steak sauce. Fish sauce. Oyster sauce. Cocktail sauce. Horseradish. Ketchup and mustard. Meat flavorings and tenderizers. Bouillon cubes. Hot sauce. Tabasco sauce. Marinades. Taco seasonings. Relishes.  Fats and Oils  Butter, stick margarine, lard, shortening, ghee, and bacon fat. Coconut, palm kernel, or palm oils. Regular salad dressings.  Other  Pickles and olives. Salted popcorn and pretzels.  The items listed above may not be a complete list of foods and beverages to avoid. Contact your dietitian for more information.  WHERE CAN I FIND MORE INFORMATION?  National Heart, Lung, and Blood Institute: www.nhlbi.nih.gov/health/health-topics/topics/dash/     This information is not intended to replace advice given to you by your health care provider. Make sure you discuss any questions you have with your health care provider.     Document Released: 10/02/2011 Document Revised: 11/03/2014 Document Reviewed: 08/17/2013  Elsevier Interactive Patient Education ©2016 Elsevier Inc.

## 2016-07-16 LAB — BASIC METABOLIC PANEL
BUN/Creatinine Ratio: 17 (ref 12–28)
BUN: 16 mg/dL (ref 8–27)
CO2: 20 mmol/L (ref 18–29)
Calcium: 9.9 mg/dL (ref 8.7–10.3)
Chloride: 98 mmol/L (ref 96–106)
Creatinine, Ser: 0.96 mg/dL (ref 0.57–1.00)
GFR calc Af Amer: 69 mL/min/{1.73_m2} (ref >59)
GFR calc non Af Amer: 60 mL/min/{1.73_m2} (ref >59)
Glucose: 229 mg/dL — ABNORMAL HIGH (ref 65–99)
Potassium: 4.3 mmol/L (ref 3.5–5.2)
Sodium: 139 mmol/L (ref 134–144)

## 2016-07-16 LAB — HEMOGLOBIN A1C
Est. average glucose Bld gHb Est-mCnc: 174 mg/dL
Hgb A1c MFr Bld: 7.7 % — ABNORMAL HIGH (ref 4.8–5.6)

## 2016-08-02 ENCOUNTER — Other Ambulatory Visit: Payer: Self-pay | Admitting: Internal Medicine

## 2016-12-01 ENCOUNTER — Other Ambulatory Visit: Payer: Self-pay | Admitting: Internal Medicine

## 2016-12-10 ENCOUNTER — Ambulatory Visit (INDEPENDENT_AMBULATORY_CARE_PROVIDER_SITE_OTHER): Payer: BLUE CROSS/BLUE SHIELD | Admitting: Internal Medicine

## 2016-12-10 ENCOUNTER — Telehealth: Payer: Self-pay

## 2016-12-10 ENCOUNTER — Encounter: Payer: Self-pay | Admitting: Internal Medicine

## 2016-12-10 ENCOUNTER — Other Ambulatory Visit: Payer: Self-pay | Admitting: Internal Medicine

## 2016-12-10 VITALS — BP 138/80 | HR 61 | Temp 97.7°F | Ht 68.0 in | Wt 211.0 lb

## 2016-12-10 DIAGNOSIS — I471 Supraventricular tachycardia: Secondary | ICD-10-CM

## 2016-12-10 DIAGNOSIS — I10 Essential (primary) hypertension: Secondary | ICD-10-CM | POA: Diagnosis not present

## 2016-12-10 DIAGNOSIS — E1165 Type 2 diabetes mellitus with hyperglycemia: Secondary | ICD-10-CM | POA: Diagnosis not present

## 2016-12-10 DIAGNOSIS — Z1231 Encounter for screening mammogram for malignant neoplasm of breast: Secondary | ICD-10-CM | POA: Diagnosis not present

## 2016-12-10 DIAGNOSIS — E7800 Pure hypercholesterolemia, unspecified: Secondary | ICD-10-CM | POA: Diagnosis not present

## 2016-12-10 DIAGNOSIS — I495 Sick sinus syndrome: Secondary | ICD-10-CM | POA: Diagnosis not present

## 2016-12-10 DIAGNOSIS — Z0000 Encounter for general adult medical examination without abnormal findings: Secondary | ICD-10-CM | POA: Diagnosis not present

## 2016-12-10 DIAGNOSIS — Z1239 Encounter for other screening for malignant neoplasm of breast: Secondary | ICD-10-CM

## 2016-12-10 DIAGNOSIS — IMO0001 Reserved for inherently not codable concepts without codable children: Secondary | ICD-10-CM

## 2016-12-10 DIAGNOSIS — I499 Cardiac arrhythmia, unspecified: Secondary | ICD-10-CM

## 2016-12-10 DIAGNOSIS — E2839 Other primary ovarian failure: Secondary | ICD-10-CM

## 2016-12-10 LAB — POCT URINALYSIS DIPSTICK
BILIRUBIN UA: NEGATIVE
GLUCOSE UA: NEGATIVE
KETONES UA: NEGATIVE
Nitrite, UA: NEGATIVE
RBC UA: NEGATIVE
SPEC GRAV UA: 1.015
UROBILINOGEN UA: 0.2
pH, UA: 5

## 2016-12-10 MED ORDER — GLUCOSE BLOOD VI STRP: 1.0000 | ORAL_STRIP | Freq: Two times a day (BID) | 3 refills | Status: DC

## 2016-12-10 NOTE — Telephone Encounter (Signed)
Pt's OneTouch Ultra Blue Strips were not approved by insurance after trying for Prior Auth. Please Advice?

## 2016-12-10 NOTE — Patient Instructions (Signed)
Health Maintenance  Topic Date Due  . DEXA SCAN  05/17/2010  . COLONOSCOPY  10/28/2015  . HEMOGLOBIN A1C  01/12/2017  . OPHTHALMOLOGY EXAM  01/21/2017  . MAMMOGRAM  07/10/2017  . FOOT EXAM  12/10/2017  . TETANUS/TDAP  06/01/2021  . INFLUENZA VACCINE  Completed  . ZOSTAVAX  Completed  . Hepatitis C Screening  Completed  . PNA vac Low Risk Adult  Completed

## 2016-12-10 NOTE — Progress Notes (Signed)
Date:  12/10/2016   Name:  Kimberly Salazar   DOB:  [birth year]   MRN:  [id number]   Chief Complaint: Annual Exam Kimberly Salazar is a 71 y.o. female who presents today for her Complete Annual Exam. She feels well. She reports exercising walking. She reports she is sleeping fairly well. No breast problems - mammogram up to date.  She is due for colonoscopy - she has the letter to call. She has never had a DEXA.  She is doing well with pacemaker.  No problems or malfunction.  It is followed closely by cardiology.   Diabetes  She presents for her follow-up diabetic visit. She has type 2 diabetes mellitus. Her disease course has been stable. Pertinent negatives for hypoglycemia include no dizziness, headaches, nervousness/anxiousness or tremors. Pertinent negatives for diabetes include no chest pain, no fatigue, no polydipsia and no polyuria. Symptoms are stable. She is following a generally healthy diet. She monitors blood glucose at home 1-2 x per week. Her breakfast blood glucose is taken between 6-7 am. Her breakfast blood glucose range is generally 140-180 mg/dl. An ACE inhibitor/angiotensin II receptor blocker is being taken.  Hypertension  This is a chronic problem. The current episode started more than 1 year ago. The problem is unchanged. Pertinent negatives include no chest pain, headaches, palpitations or shortness of breath. Past treatments include beta blockers and alpha 1 blockers. Hypertensive end-organ damage includes CAD/MI.  Hyperlipidemia  This is a chronic problem. The problem is controlled. Pertinent negatives include no chest pain or shortness of breath. Current antihyperlipidemic treatment includes statins and ezetimibe. The current treatment provides significant improvement of lipids.   Lab Results  Component Value Date   HGBA1C 7.7 (H) 07/15/2016   Lab Results  Component Value Date   CHOL 158 11/19/2015   HDL 54 11/19/2015   LDLCALC 58 11/19/2015   TRIG 232 (H)  11/19/2015   CHOLHDL 2.9 11/19/2015     Review of Systems  Constitutional: Negative for chills, fatigue and fever.  HENT: Negative for congestion, hearing loss, tinnitus, trouble swallowing and voice change.   Eyes: Negative for visual disturbance.  Respiratory: Negative for cough, chest tightness, shortness of breath and wheezing.   Cardiovascular: Negative for chest pain, palpitations and leg swelling.  Gastrointestinal: Negative for abdominal pain, constipation, diarrhea and vomiting.  Endocrine: Negative for polydipsia and polyuria.  Genitourinary: Negative for dysuria, frequency, genital sores, vaginal bleeding and vaginal discharge.  Musculoskeletal: Negative for arthralgias, gait problem and joint swelling.  Skin: Negative for color change and rash.  Neurological: Negative for dizziness, tremors, light-headedness and headaches.  Hematological: Negative for adenopathy. Does not bruise/bleed easily.  Psychiatric/Behavioral: Negative for dysphoric mood and sleep disturbance. The patient is not nervous/anxious.     Patient Active Problem List   Diagnosis Date Noted  . Basal cell carcinoma, leg 07/15/2016  . Bradycardia 05/17/2015  . Abnormal finding on thallium stress test 05/17/2015  . Supraventricular tachycardia ([place]) 05/17/2015  . Hypercholesteremia 04/12/2015  . Essential (primary) hypertension 04/12/2015  . History of uterine cancer 04/12/2015  . Arrhythmia, sinus node ([place]) 04/12/2015  . Uncontrolled type 2 diabetes mellitus without complication, without long-term current use of insulin ([place]) 04/12/2015    Prior to Admission medications   Medication Sig Start Date End Date Taking? Authorizing Provider  aspirin 81 MG chewable tablet Chew 1 tablet by mouth daily.   Yes Historical Provider, MD  BENICAR HCT 20-12.5 MG tablet TAKE 1 TABLET BY MOUTH EVERY DAY 12/01/16  Yes Glean Hess, MD  glimepiride (AMARYL) 2 MG tablet TAKE 1 TAB 2 TIMES A DAY BEFORE MEALS 01/04/16  Yes  Glean Hess, MD  glucose blood (ONE TOUCH ULTRA TEST) test strip 1 strip 2 (two) times daily. 10/12/14  Yes Historical Provider, MD  metoprolol succinate (TOPROL-XL) 25 MG 24 hr tablet TAKE 1 TABLET BY MOUTH EVERY DAY 08/02/16  Yes Glean Hess, MD  Multiple Vitamins-Minerals (MULTIVITAL) tablet Take 1 tablet by mouth daily.   Yes Historical Provider, MD  omega-3 acid ethyl esters (LOVAZA) 1 g capsule TAKE 4 CAPSULES BY MOUTH DAILY 03/20/16  Yes Glean Hess, MD  simvastatin (ZOCOR) 80 MG tablet TAKE 1 TABLET BY MOUTH EVERY DAY 04/04/16  Yes Glean Hess, MD  SitaGLIPtin-MetFORMIN HCl (JANUMET XR) 50-1000 MG TB24 Take 2 tablets by mouth daily. 07/15/16  Yes Glean Hess, MD    No Known Allergies  Past Surgical History:  Procedure Laterality Date  . ABDOMINAL HYSTERECTOMY  1994   Uterine CA  . APPENDECTOMY    . CATARACT EXTRACTION Left 2011  . LITHOTRIPSY  2005  . PACEMAKER INSERTION  2010    Social History  Substance Use Topics  . Smoking status: Never Smoker  . Smokeless tobacco: Never Used  . Alcohol use No     Medication list has been reviewed and updated.   Physical Exam  Constitutional: She is oriented to person, place, and time. She appears well-developed and well-nourished. No distress.  HENT:  Head: Normocephalic and atraumatic.  Right Ear: Tympanic membrane and ear canal normal.  Left Ear: Tympanic membrane and ear canal normal.  Nose: Right sinus exhibits no maxillary sinus tenderness. Left sinus exhibits no maxillary sinus tenderness.  Mouth/Throat: Uvula is midline and oropharynx is clear and moist.  Eyes: Conjunctivae and EOM are normal. Right eye exhibits no discharge. Left eye exhibits no discharge. No scleral icterus.  Neck: Normal range of motion. Carotid bruit is not present. No erythema present. No thyromegaly present.  Cardiovascular: Normal rate, regular rhythm, normal heart sounds and normal pulses.   Pulmonary/Chest: Effort normal. No  respiratory distress. She has no wheezes. Right breast exhibits no mass, no nipple discharge, no skin change and no tenderness. Left breast exhibits no mass, no nipple discharge, no skin change and no tenderness.    Abdominal: Soft. Bowel sounds are normal. There is no hepatosplenomegaly. There is no tenderness. There is no CVA tenderness.  Musculoskeletal: Normal range of motion.  Lymphadenopathy:    She has no cervical adenopathy.    She has no axillary adenopathy.  Neurological: She is alert and oriented to person, place, and time. She has normal reflexes. No cranial nerve deficit or sensory deficit.  Skin: Skin is warm, dry and intact. No rash noted.  Thickening of great toe nails both feet c/w trauma  Psychiatric: She has a normal mood and affect. Her speech is normal and behavior is normal. Thought content normal.  Nursing note and vitals reviewed.   BP 138/80   Pulse 61   Temp 97.7 F (36.5 C)   Ht 5\' 8"  (1.727 m)   Wt 211 lb (95.7 kg)   SpO2 98%   BMI 32.08 kg/m   Assessment and Plan: 1. Annual physical exam Schedule 10 yr colonoscopy Mammogram due in September @ [facility] - POCT urinalysis dipstick  2. Breast cancer screening As above  3. Arrhythmia, sinus node ([place]) Doing well s/p pacemaker  4. Essential (primary) hypertension controlled -  CBC with Differential/Platelet - Comprehensive metabolic panel - TSH  5. Supraventricular tachycardia (HCC) No recent events  6. Uncontrolled type 2 diabetes mellitus without complication, without long-term current use of insulin (HCC) Continue current therapy -adjust if needed - glucose blood (ONE TOUCH ULTRA TEST) test strip; 1 each by Other route 2 (two) times daily.  Dispense: 100 each; Refill: 3 - Hemoglobin A1c - Microalbumin / creatinine urine ratio  7. Hypercholesteremia On dual therapy - Lipid panel  8. Ovarian failure - DG Bone Density   Halina Maidens, MD [facility]  12/10/2016

## 2016-12-10 NOTE — Telephone Encounter (Signed)
She can either buy the strips herself.  Or, see if there is another meter and supplies that are covered by her insurance.  She will likely have to call them to find out this information.

## 2016-12-11 LAB — COMPREHENSIVE METABOLIC PANEL
ALBUMIN: 4.7 g/dL (ref 3.5–4.8)
ALK PHOS: 42 IU/L (ref 39–117)
ALT: 45 IU/L — AB (ref 0–32)
AST: 31 IU/L (ref 0–40)
Albumin/Globulin Ratio: 2 (ref 1.2–2.2)
BILIRUBIN TOTAL: 0.6 mg/dL (ref 0.0–1.2)
BUN/Creatinine Ratio: 28 (ref 12–28)
BUN: 26 mg/dL (ref 8–27)
CHLORIDE: 99 mmol/L (ref 96–106)
CO2: 22 mmol/L (ref 18–29)
CREATININE: 0.93 mg/dL (ref 0.57–1.00)
Calcium: 10 mg/dL (ref 8.7–10.3)
GFR calc Af Amer: 72 mL/min/{1.73_m2} (ref >59)
GFR calc non Af Amer: 62 mL/min/{1.73_m2} (ref >59)
GLUCOSE: 197 mg/dL — AB (ref 65–99)
Globulin, Total: 2.4 g/dL (ref 1.5–4.5)
Potassium: 4.5 mmol/L (ref 3.5–5.2)
Sodium: 140 mmol/L (ref 134–144)
TOTAL PROTEIN: 7.1 g/dL (ref 6.0–8.5)

## 2016-12-11 LAB — CBC WITH DIFFERENTIAL/PLATELET
BASOS ABS: 0 10*3/uL (ref 0.0–0.2)
Basos: 0 %
EOS (ABSOLUTE): 0.1 10*3/uL (ref 0.0–0.4)
Eos: 2 %
HEMOGLOBIN: 13.9 g/dL (ref 11.1–15.9)
Hematocrit: 41.6 % (ref 34.0–46.6)
IMMATURE GRANS (ABS): 0 10*3/uL (ref 0.0–0.1)
Immature Granulocytes: 0 %
LYMPHS: 26 %
Lymphocytes Absolute: 1.6 10*3/uL (ref 0.7–3.1)
MCH: 30.7 pg (ref 26.6–33.0)
MCHC: 33.4 g/dL (ref 31.5–35.7)
MCV: 92 fL (ref 79–97)
MONOCYTES: 6 %
Monocytes Absolute: 0.4 10*3/uL (ref 0.1–0.9)
NEUTROS ABS: 3.9 10*3/uL (ref 1.4–7.0)
NEUTROS PCT: 66 %
PLATELETS: 168 10*3/uL (ref 150–379)
RBC: 4.53 x10E6/uL (ref 3.77–5.28)
RDW: 13.3 % (ref 12.3–15.4)
WBC: 6 10*3/uL (ref 3.4–10.8)

## 2016-12-11 LAB — LIPID PANEL
CHOLESTEROL TOTAL: 159 mg/dL (ref 100–199)
Chol/HDL Ratio: 3.3 ratio units (ref 0.0–4.4)
HDL: 48 mg/dL (ref >39)
LDL Calculated: 71 mg/dL (ref 0–99)
Triglycerides: 199 mg/dL — ABNORMAL HIGH (ref 0–149)
VLDL Cholesterol Cal: 40 mg/dL (ref 5–40)

## 2016-12-11 LAB — HEMOGLOBIN A1C
Est. average glucose Bld gHb Est-mCnc: 151 mg/dL
HEMOGLOBIN A1C: 6.9 % — AB (ref 4.8–5.6)

## 2016-12-11 LAB — TSH: TSH: 1.87 u[IU]/mL (ref 0.450–4.500)

## 2016-12-12 LAB — MICROALBUMIN / CREATININE URINE RATIO
CREATININE, UR: 145.3 mg/dL
MICROALB/CREAT RATIO: 34 mg/g{creat} — AB (ref 0.0–30.0)
MICROALBUM., U, RANDOM: 49.4 ug/mL

## 2017-01-29 ENCOUNTER — Other Ambulatory Visit: Payer: Self-pay | Admitting: Internal Medicine

## 2017-02-03 ENCOUNTER — Other Ambulatory Visit: Payer: Self-pay | Admitting: Internal Medicine

## 2017-04-08 ENCOUNTER — Other Ambulatory Visit: Payer: Self-pay | Admitting: Internal Medicine

## 2017-04-09 ENCOUNTER — Encounter: Payer: Self-pay | Admitting: Internal Medicine

## 2017-04-09 ENCOUNTER — Ambulatory Visit (INDEPENDENT_AMBULATORY_CARE_PROVIDER_SITE_OTHER): Payer: BLUE CROSS/BLUE SHIELD | Admitting: Internal Medicine

## 2017-04-09 VITALS — BP 136/68 | HR 60 | Ht 68.0 in | Wt 210.0 lb

## 2017-04-09 DIAGNOSIS — E2839 Other primary ovarian failure: Secondary | ICD-10-CM

## 2017-04-09 DIAGNOSIS — I10 Essential (primary) hypertension: Secondary | ICD-10-CM

## 2017-04-09 DIAGNOSIS — E1165 Type 2 diabetes mellitus with hyperglycemia: Secondary | ICD-10-CM | POA: Diagnosis not present

## 2017-04-09 DIAGNOSIS — IMO0001 Reserved for inherently not codable concepts without codable children: Secondary | ICD-10-CM

## 2017-04-09 DIAGNOSIS — E7800 Pure hypercholesterolemia, unspecified: Secondary | ICD-10-CM

## 2017-04-09 NOTE — Progress Notes (Signed)
Date:  04/09/2017   Name:  Kimberly Salazar   DOB:  July 04, 1946   MRN:  [id number]   Chief Complaint: Hyperlipidemia; Hypertension; and Diabetes     Hyperlipidemia  This is a chronic problem. Pertinent negatives include no chest pain or shortness of breath. Current antihyperlipidemic treatment includes statins. The current treatment provides significant improvement of lipids.  Hypertension  This is a chronic problem. The problem is controlled. Pertinent negatives include no chest pain, headaches, palpitations or shortness of breath. Past treatments include angiotensin blockers.  Diabetes  She presents for her follow-up diabetic visit. She has type 2 diabetes mellitus. Pertinent negatives for hypoglycemia include no headaches or tremors. Pertinent negatives for diabetes include no chest pain, no fatigue, no polydipsia and no polyuria. Current diabetic treatment includes oral agent (triple therapy). She is compliant with treatment all of the time.   Lab Results  Component Value Date   HGBA1C 6.9 (H) 12/10/2016   Lab Results  Component Value Date   CHOL 159 12/10/2016   HDL 48 12/10/2016   LDLCALC 71 12/10/2016   TRIG 199 (H) 12/10/2016   CHOLHDL 3.3 12/10/2016   Lab Results  Component Value Date   CREATININE 0.93 12/10/2016      Review of Systems  Constitutional: Negative for appetite change, fatigue, fever and unexpected weight change.  HENT: Negative for tinnitus and trouble swallowing.   Eyes: Negative for visual disturbance.  Respiratory: Negative for cough, chest tightness and shortness of breath.   Cardiovascular: Negative for chest pain, palpitations and leg swelling.  Gastrointestinal: Negative for abdominal pain.  Endocrine: Negative for polydipsia and polyuria.  Genitourinary: Negative for dysuria and hematuria.  Musculoskeletal: Positive for back pain (mild, intermittent). Negative for arthralgias, gait problem and joint swelling.  Neurological: Negative for  tremors, numbness and headaches.  Psychiatric/Behavioral: Negative for dysphoric mood and sleep disturbance.    Patient Active Problem List   Diagnosis Date Noted  . Basal cell carcinoma, leg 07/15/2016  . Abnormal finding on thallium stress test 05/17/2015  . Supraventricular tachycardia ([place]) 05/17/2015  . Hypercholesteremia 04/12/2015  . Essential (primary) hypertension 04/12/2015  . History of uterine cancer 04/12/2015  . Uncontrolled type 2 diabetes mellitus without complication, without long-term current use of insulin ([place]) 04/12/2015    Prior to Admission medications   Medication Sig Start Date End Date Taking? Authorizing Provider  aspirin 81 MG chewable tablet Chew 1 tablet by mouth daily.   Yes [provider]  BENICAR HCT 20-12.5 MG tablet TAKE 1 TABLET BY MOUTH EVERY DAY 12/01/16  Yes Glean Hess, MD  glimepiride (AMARYL) 2 MG tablet TAKE 1 TAB 2 TIMES A DAY BEFORE MEALS 02/03/17  Yes Glean Hess, MD  glucose blood (ONE TOUCH ULTRA TEST) test strip 1 each by Other route 2 (two) times daily. 12/10/16  Yes Glean Hess, MD  metoprolol succinate (TOPROL-XL) 25 MG 24 hr tablet TAKE 1 TABLET BY MOUTH EVERY DAY 01/29/17  Yes Glean Hess, MD  Multiple Vitamins-Minerals (MULTIVITAL) tablet Take 1 tablet by mouth daily.   Yes [provider]  omega-3 acid ethyl esters (LOVAZA) 1 g capsule TAKE 4 CAPSULES BY MOUTH DAILY 01/29/17  Yes Glean Hess, MD  simvastatin (ZOCOR) 80 MG tablet TAKE 1 TABLET BY MOUTH EVERY DAY 04/08/17  Yes Glean Hess, MD  SitaGLIPtin-MetFORMIN HCl (JANUMET XR) 50-1000 MG TB24 Take 2 tablets by mouth daily. 07/15/16  Yes Glean Hess, MD    No Known  Allergies  Past Surgical History:  Procedure Laterality Date  . ABDOMINAL HYSTERECTOMY  1994   Uterine CA  . APPENDECTOMY    . CATARACT EXTRACTION Left 2011  . LITHOTRIPSY  2005  . PACEMAKER INSERTION  2010    Social History  Substance Use Topics  . Smoking  status: Never Smoker  . Smokeless tobacco: Never Used  . Alcohol use No   Depression screen [facility] 2/9 04/09/2017 11/19/2015 05/17/2015  Decreased Interest 0 0 0  Down, Depressed, Hopeless 0 0 0  PHQ - 2 Score 0 0 0   Fall Risk  04/09/2017 11/19/2015 05/17/2015  Falls in the past year? No No No    Medication list has been reviewed and updated.   Physical Exam  Constitutional: She is oriented to person, place, and time. She appears well-developed. No distress.  HENT:  Head: Normocephalic and atraumatic.  Neck: Normal range of motion. Neck supple. Carotid bruit is not present.  Cardiovascular: Normal rate, regular rhythm and normal heart sounds.   Pulmonary/Chest: Effort normal and breath sounds normal. No respiratory distress. She has no wheezes.  Musculoskeletal: Normal range of motion.  Neurological: She is alert and oriented to person, place, and time.  Skin: Skin is warm and dry. No rash noted.  Psychiatric: She has a normal mood and affect. Her speech is normal and behavior is normal. Thought content normal.  Nursing note and vitals reviewed.   BP 136/68 (BP Location: Right Arm, Patient Position: Sitting, Cuff Size: Large)   Pulse 60   Ht 5\' 8"  (1.727 m)   Wt 210 lb (95.3 kg)   SpO2 97%   BMI 31.93 kg/m   Assessment and Plan: 1. Essential (primary) hypertension controlled  2. Uncontrolled type 2 diabetes mellitus without complication, without long-term current use of insulin (HCC) Stable - encourage more frequent FSBS Pt to schedule Eye exam - Hemoglobin A1c  3. Hypercholesteremia On statins and Omega-3  4. Ovarian failure - DG Bone Density   No orders of the defined types were placed in this encounter.   Halina Maidens, MD [facility]  04/09/2017

## 2017-04-09 NOTE — Patient Instructions (Signed)
Schedule diabetic eye exam  Schedule colonoscopy  Schedule Mammogram and Bone density  Consider new shingles vaccine (Shingrix) given at pharmacy

## 2017-04-10 LAB — HEMOGLOBIN A1C
ESTIMATED AVERAGE GLUCOSE: 163 mg/dL
Hgb A1c MFr Bld: 7.3 % — ABNORMAL HIGH (ref 4.8–5.6)

## 2017-06-02 LAB — HM DIABETES EYE EXAM

## 2017-06-08 ENCOUNTER — Other Ambulatory Visit: Payer: Self-pay | Admitting: Internal Medicine

## 2017-06-22 ENCOUNTER — Encounter: Payer: Self-pay | Admitting: Internal Medicine

## 2017-07-17 ENCOUNTER — Other Ambulatory Visit: Payer: Self-pay | Admitting: Internal Medicine

## 2017-07-17 DIAGNOSIS — E1165 Type 2 diabetes mellitus with hyperglycemia: Principal | ICD-10-CM

## 2017-07-17 DIAGNOSIS — IMO0001 Reserved for inherently not codable concepts without codable children: Secondary | ICD-10-CM

## 2017-08-10 ENCOUNTER — Ambulatory Visit: Payer: BLUE CROSS/BLUE SHIELD | Admitting: Internal Medicine

## 2017-09-02 ENCOUNTER — Ambulatory Visit: Payer: BLUE CROSS/BLUE SHIELD | Admitting: Internal Medicine

## 2017-09-02 ENCOUNTER — Encounter: Payer: Self-pay | Admitting: Internal Medicine

## 2017-09-02 VITALS — BP 130/74 | HR 60 | Ht 68.0 in | Wt 211.6 lb

## 2017-09-02 DIAGNOSIS — I10 Essential (primary) hypertension: Secondary | ICD-10-CM

## 2017-09-02 DIAGNOSIS — E1165 Type 2 diabetes mellitus with hyperglycemia: Secondary | ICD-10-CM | POA: Diagnosis not present

## 2017-09-02 DIAGNOSIS — Z1231 Encounter for screening mammogram for malignant neoplasm of breast: Secondary | ICD-10-CM | POA: Diagnosis not present

## 2017-09-02 DIAGNOSIS — IMO0001 Reserved for inherently not codable concepts without codable children: Secondary | ICD-10-CM

## 2017-09-02 DIAGNOSIS — Z23 Encounter for immunization: Secondary | ICD-10-CM

## 2017-09-02 DIAGNOSIS — Z1239 Encounter for other screening for malignant neoplasm of breast: Secondary | ICD-10-CM

## 2017-09-02 MED ORDER — ZOSTER VAC RECOMB ADJUVANTED 50 MCG/0.5ML IM SUSR: 0.5000 mL | Freq: Once | INTRAMUSCULAR | 1 refills | Status: AC
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
## 2017-09-02 NOTE — Patient Instructions (Signed)
Remember to schedule annual eye exam, mammogram and bone density.

## 2017-09-03 LAB — HEMOGLOBIN A1C
Est. average glucose Bld gHb Est-mCnc: 171 mg/dL
Hgb A1c MFr Bld: 7.6 % — ABNORMAL HIGH (ref 4.8–5.6)

## 2017-09-03 LAB — BASIC METABOLIC PANEL
BUN / CREAT RATIO: 17 (ref 12–28)
BUN: 15 mg/dL (ref 8–27)
CALCIUM: 10 mg/dL (ref 8.7–10.3)
CHLORIDE: 100 mmol/L (ref 96–106)
CO2: 23 mmol/L (ref 20–29)
Creatinine, Ser: 0.87 mg/dL (ref 0.57–1.00)
GFR, EST AFRICAN AMERICAN: 77 mL/min/{1.73_m2} (ref >59)
GFR, EST NON AFRICAN AMERICAN: 67 mL/min/{1.73_m2} (ref >59)
Glucose: 229 mg/dL — ABNORMAL HIGH (ref 65–99)
POTASSIUM: 4.5 mmol/L (ref 3.5–5.2)
SODIUM: 140 mmol/L (ref 134–144)

## 2017-09-04 ENCOUNTER — Other Ambulatory Visit: Payer: Self-pay | Admitting: Internal Medicine

## 2018-02-05 ENCOUNTER — Other Ambulatory Visit: Payer: Self-pay | Admitting: Internal Medicine

## 2018-02-24 ENCOUNTER — Other Ambulatory Visit: Payer: Self-pay | Admitting: Internal Medicine

## 2018-03-23 ENCOUNTER — Encounter: Payer: Self-pay | Admitting: Internal Medicine

## 2018-03-23 ENCOUNTER — Ambulatory Visit (INDEPENDENT_AMBULATORY_CARE_PROVIDER_SITE_OTHER): Payer: BLUE CROSS/BLUE SHIELD | Admitting: Internal Medicine

## 2018-03-23 VITALS — BP 138/84 | HR 62 | Temp 97.9°F | Resp 16 | Ht 66.0 in | Wt 211.6 lb

## 2018-03-23 DIAGNOSIS — I10 Essential (primary) hypertension: Secondary | ICD-10-CM

## 2018-03-23 DIAGNOSIS — IMO0001 Reserved for inherently not codable concepts without codable children: Secondary | ICD-10-CM

## 2018-03-23 DIAGNOSIS — E7800 Pure hypercholesterolemia, unspecified: Secondary | ICD-10-CM

## 2018-03-23 DIAGNOSIS — Z950 Presence of cardiac pacemaker: Secondary | ICD-10-CM | POA: Diagnosis not present

## 2018-03-23 DIAGNOSIS — Z1211 Encounter for screening for malignant neoplasm of colon: Secondary | ICD-10-CM | POA: Diagnosis not present

## 2018-03-23 DIAGNOSIS — E2839 Other primary ovarian failure: Secondary | ICD-10-CM | POA: Diagnosis not present

## 2018-03-23 DIAGNOSIS — I471 Supraventricular tachycardia: Secondary | ICD-10-CM | POA: Diagnosis not present

## 2018-03-23 DIAGNOSIS — Z0000 Encounter for general adult medical examination without abnormal findings: Secondary | ICD-10-CM

## 2018-03-23 DIAGNOSIS — Z1231 Encounter for screening mammogram for malignant neoplasm of breast: Secondary | ICD-10-CM | POA: Diagnosis not present

## 2018-03-23 DIAGNOSIS — E1165 Type 2 diabetes mellitus with hyperglycemia: Secondary | ICD-10-CM | POA: Diagnosis not present

## 2018-03-23 LAB — POCT URINALYSIS DIPSTICK
Bilirubin, UA: NEGATIVE
GLUCOSE UA: POSITIVE — AB
Leukocytes, UA: NEGATIVE
Nitrite, UA: NEGATIVE
PH UA: 6 (ref 5.0–8.0)
Protein, UA: NEGATIVE
Spec Grav, UA: 1.015 (ref 1.010–1.025)
UROBILINOGEN UA: 0.2 U/dL

## 2018-03-23 NOTE — Patient Instructions (Signed)
Health Maintenance for Postmenopausal Women Menopause is a normal process in which your reproductive ability comes to an end. This process happens gradually over a span of months to years, usually between the ages of 22 and 9. Menopause is complete when you have missed 12 consecutive menstrual periods. It is important to talk with your health care provider about some of the most common conditions that affect postmenopausal women, such as heart disease, cancer, and bone loss (osteoporosis). Adopting a healthy lifestyle and getting preventive care can help to promote your health and wellness. Those actions can also lower your chances of developing some of these common conditions. What should I know about menopause? During menopause, you may experience a number of symptoms, such as:  Moderate-to-severe hot flashes.  Night sweats.  Decrease in sex drive.  Mood swings.  Headaches.  Tiredness.  Irritability.  Memory problems.  Insomnia.  Choosing to treat or not to treat menopausal changes is an individual decision that you make with your health care provider. What should I know about hormone replacement therapy and supplements? Hormone therapy products are effective for treating symptoms that are associated with menopause, such as hot flashes and night sweats. Hormone replacement carries certain risks, especially as you become older. If you are thinking about using estrogen or estrogen with progestin treatments, discuss the benefits and risks with your health care provider. What should I know about heart disease and stroke? Heart disease, heart attack, and stroke become more likely as you age. This may be due, in part, to the hormonal changes that your body experiences during menopause. These can affect how your body processes dietary fats, triglycerides, and cholesterol. Heart attack and stroke are both medical emergencies. There are many things that you can do to help prevent heart disease  and stroke:  Have your blood pressure checked at least every 1-2 years. High blood pressure causes heart disease and increases the risk of stroke.  If you are 53-22 years old, ask your health care provider if you should take aspirin to prevent a heart attack or a stroke.  Do not use any tobacco products, including cigarettes, chewing tobacco, or electronic cigarettes. If you need help quitting, ask your health care provider.  It is important to eat a healthy diet and maintain a healthy weight. ? Be sure to include plenty of vegetables, fruits, low-fat dairy products, and lean protein. ? Avoid eating foods that are high in solid fats, added sugars, or salt (sodium).  Get regular exercise. This is one of the most important things that you can do for your health. ? Try to exercise for at least 150 minutes each week. The type of exercise that you do should increase your heart rate and make you sweat. This is known as moderate-intensity exercise. ? Try to do strengthening exercises at least twice each week. Do these in addition to the moderate-intensity exercise.  Know your numbers.Ask your health care provider to check your cholesterol and your blood glucose. Continue to have your blood tested as directed by your health care provider.  What should I know about cancer screening? There are several types of cancer. Take the following steps to reduce your risk and to catch any cancer development as early as possible. Breast Cancer  Practice breast self-awareness. ? This means understanding how your breasts normally appear and feel. ? It also means doing regular breast self-exams. Let your health care provider know about any changes, no matter how small.  If you are 40  or older, have a clinician do a breast exam (clinical breast exam or CBE) every year. Depending on your age, family history, and medical history, it may be recommended that you also have a yearly breast X-ray (mammogram).  If you  have a family history of breast cancer, talk with your health care provider about genetic screening.  If you are at high risk for breast cancer, talk with your health care provider about having an MRI and a mammogram every year.  Breast cancer (BRCA) gene test is recommended for women who have family members with BRCA-related cancers. Results of the assessment will determine the need for genetic counseling and BRCA1 and for BRCA2 testing. BRCA-related cancers include these types: ? Breast. This occurs in males or females. ? Ovarian. ? Tubal. This may also be called fallopian tube cancer. ? Cancer of the abdominal or pelvic lining (peritoneal cancer). ? Prostate. ? Pancreatic.  Cervical, Uterine, and Ovarian Cancer Your health care provider may recommend that you be screened regularly for cancer of the pelvic organs. These include your ovaries, uterus, and vagina. This screening involves a pelvic exam, which includes checking for microscopic changes to the surface of your cervix (Pap test).  For women ages 21-65, health care providers may recommend a pelvic exam and a Pap test every three years. For women ages 79-65, they may recommend the Pap test and pelvic exam, combined with testing for human papilloma virus (HPV), every five years. Some types of HPV increase your risk of cervical cancer. Testing for HPV may also be done on women of any age who have unclear Pap test results.  Other health care providers may not recommend any screening for nonpregnant women who are considered low risk for pelvic cancer and have no symptoms. Ask your health care provider if a screening pelvic exam is right for you.  If you have had past treatment for cervical cancer or a condition that could lead to cancer, you need Pap tests and screening for cancer for at least 20 years after your treatment. If Pap tests have been discontinued for you, your risk factors (such as having a new sexual partner) need to be  reassessed to determine if you should start having screenings again. Some women have medical problems that increase the chance of getting cervical cancer. In these cases, your health care provider may recommend that you have screening and Pap tests more often.  If you have a family history of uterine cancer or ovarian cancer, talk with your health care provider about genetic screening.  If you have vaginal bleeding after reaching menopause, tell your health care provider.  There are currently no reliable tests available to screen for ovarian cancer.  Lung Cancer Lung cancer screening is recommended for adults 69-62 years old who are at high risk for lung cancer because of a history of smoking. A yearly low-dose CT scan of the lungs is recommended if you:  Currently smoke.  Have a history of at least 30 pack-years of smoking and you currently smoke or have quit within the past 15 years. A pack-year is smoking an average of one pack of cigarettes per day for one year.  Yearly screening should:  Continue until it has been 15 years since you quit.  Stop if you develop a health problem that would prevent you from having lung cancer treatment.  Colorectal Cancer  This type of cancer can be detected and can often be prevented.  Routine colorectal cancer screening usually begins at  age 42 and continues through age 45.  If you have risk factors for colon cancer, your health care provider may recommend that you be screened at an earlier age.  If you have a family history of colorectal cancer, talk with your health care provider about genetic screening.  Your health care provider may also recommend using home test kits to check for hidden blood in your stool.  A small camera at the end of a tube can be used to examine your colon directly (sigmoidoscopy or colonoscopy). This is done to check for the earliest forms of colorectal cancer.  Direct examination of the colon should be repeated every  5-10 years until age 71. However, if early forms of precancerous polyps or small growths are found or if you have a family history or genetic risk for colorectal cancer, you may need to be screened more often.  Skin Cancer  Check your skin from head to toe regularly.  Monitor any moles. Be sure to tell your health care provider: ? About any new moles or changes in moles, especially if there is a change in a mole's shape or color. ? If you have a mole that is larger than the size of a pencil eraser.  If any of your family members has a history of skin cancer, especially at a young age, talk with your health care provider about genetic screening.  Always use sunscreen. Apply sunscreen liberally and repeatedly throughout the day.  Whenever you are outside, protect yourself by wearing long sleeves, pants, a wide-brimmed hat, and sunglasses.  What should I know about osteoporosis? Osteoporosis is a condition in which bone destruction happens more quickly than new bone creation. After menopause, you may be at an increased risk for osteoporosis. To help prevent osteoporosis or the bone fractures that can happen because of osteoporosis, the following is recommended:  If you are 46-71 years old, get at least 1,000 mg of calcium and at least 600 mg of vitamin D per day.  If you are older than age 55 but younger than age 65, get at least 1,200 mg of calcium and at least 600 mg of vitamin D per day.  If you are older than age 54, get at least 1,200 mg of calcium and at least 800 mg of vitamin D per day.  Smoking and excessive alcohol intake increase the risk of osteoporosis. Eat foods that are rich in calcium and vitamin D, and do weight-bearing exercises several times each week as directed by your health care provider. What should I know about how menopause affects my mental health? Depression may occur at any age, but it is more common as you become older. Common symptoms of depression  include:  Low or sad mood.  Changes in sleep patterns.  Changes in appetite or eating patterns.  Feeling an overall lack of motivation or enjoyment of activities that you previously enjoyed.  Frequent crying spells.  Talk with your health care provider if you think that you are experiencing depression. What should I know about immunizations? It is important that you get and maintain your immunizations. These include:  Tetanus, diphtheria, and pertussis (Tdap) booster vaccine.  Influenza every year before the flu season begins.  Pneumonia vaccine.  Shingles vaccine.  Your health care provider may also recommend other immunizations. This information is not intended to replace advice given to you by your health care provider. Make sure you discuss any questions you have with your health care provider. Document Released: 12/05/2005  Document Revised: 05/02/2016 Document Reviewed: 07/17/2015 Elsevier Interactive Patient Education  2018 [employer]  

## 2018-03-23 NOTE — Progress Notes (Signed)
Date:  03/23/2018   Name:  Kimberly Salazar   DOB:  01-30-1946   MRN:  [id number]   Chief Complaint: Annual Exam Kimberly Salazar is a 72 y.o. female who presents today for her Complete Annual Exam. She feels fairly well. She reports exercising walking some. She reports she is sleeping well. She is due for a mammogram.  She is still working full time but plans to retire soon.  Hypertension  This is a chronic problem. The problem is controlled. Pertinent negatives include no chest pain, headaches, palpitations or shortness of breath. Past treatments include angiotensin blockers, diuretics and beta blockers. There are no compliance problems.  There is no history of CAD/MI.  Diabetes  She presents for her follow-up diabetic visit. She has type 2 diabetes mellitus. Her disease course has been stable. There are no hypoglycemic associated symptoms. Pertinent negatives for hypoglycemia include no dizziness, headaches, nervousness/anxiousness or tremors. Pertinent negatives for diabetes include no chest pain, no fatigue, no polydipsia and no polyuria. There are no hypoglycemic complications. There are no diabetic complications. Current diabetic treatment includes oral agent (triple therapy). She is compliant with treatment all of the time. There is no compliance with monitoring of blood glucose. An ACE inhibitor/angiotensin II receptor blocker is being taken. Eye exam is current.  Hyperlipidemia  This is a chronic problem. The problem is controlled. Pertinent negatives include no chest pain or shortness of breath. Current antihyperlipidemic treatment includes statins.   Lab Results  Component Value Date   HGBA1C 7.6 (H) 09/02/2017     Review of Systems  Constitutional: Negative for chills, fatigue and fever.  HENT: Negative for congestion, hearing loss, tinnitus, trouble swallowing and voice change.   Eyes: Negative for visual disturbance.  Respiratory: Negative for cough, chest tightness, shortness  of breath and wheezing.   Cardiovascular: Negative for chest pain, palpitations and leg swelling.  Gastrointestinal: Negative for abdominal pain, constipation, diarrhea and vomiting.  Endocrine: Negative for polydipsia and polyuria.  Genitourinary: Negative for dysuria, frequency, genital sores, vaginal bleeding and vaginal discharge.  Musculoskeletal: Negative for arthralgias, gait problem and joint swelling.  Skin: Negative for color change and rash.  Neurological: Negative for dizziness, tremors, light-headedness and headaches.  Hematological: Negative for adenopathy. Does not bruise/bleed easily.  Psychiatric/Behavioral: Negative for dysphoric mood and sleep disturbance. The patient is not nervous/anxious.     Patient Active Problem List   Diagnosis Date Noted  . Ovarian failure 04/09/2017  . Basal cell carcinoma, leg 07/15/2016  . Abnormal finding on thallium stress test 05/17/2015  . Supraventricular tachycardia ([place]) 05/17/2015  . Hypercholesteremia 04/12/2015  . Essential (primary) hypertension 04/12/2015  . History of uterine cancer 04/12/2015  . Uncontrolled type 2 diabetes mellitus without complication, without long-term current use of insulin ([place]) 04/12/2015  . Cardiac pacemaker in situ 10/11/2009    Prior to Admission medications   Medication Sig Start Date End Date Taking? Authorizing Provider  aspirin 81 MG chewable tablet Chew 1 tablet by mouth daily.    [provider]  glimepiride (AMARYL) 2 MG tablet TAKE 1 TAB 2 TIMES A DAY BEFORE MEALS 02/24/18   Glean Hess, MD  glucose blood (ONE TOUCH ULTRA TEST) test strip 1 each by Other route 2 (two) times daily. 12/10/16   Glean Hess, MD  JANUMET XR 50-1000 MG TB24 TAKE 2 TABLETS BY MOUTH DAILY. 07/17/17   Glean Hess, MD  metoprolol succinate (TOPROL-XL) 25 MG 24 hr tablet TAKE 1 TABLET BY MOUTH  EVERY DAY 02/05/18   Glean Hess, MD  Multiple Vitamins-Minerals (MULTIVITAL) tablet Take 1 tablet  by mouth daily.    [provider]  olmesartan-hydrochlorothiazide (BENICAR HCT) 20-12.5 MG tablet TAKE 1 TABLET BY MOUTH EVERY DAY 09/04/17   Glean Hess, MD  omega-3 acid ethyl esters (LOVAZA) 1 g capsule TAKE 4 CAPSULES BY MOUTH DAILY 02/05/18   Glean Hess, MD  simvastatin (ZOCOR) 80 MG tablet TAKE 1 TABLET BY MOUTH EVERY DAY 04/08/17   Glean Hess, MD    No Known Allergies  Past Surgical History:  Procedure Laterality Date  . ABDOMINAL HYSTERECTOMY  1994   Uterine CA  . APPENDECTOMY    . CATARACT EXTRACTION Left 2011  . LITHOTRIPSY  2005  . PACEMAKER INSERTION  2010    Social History   Tobacco Use  . Smoking status: Never Smoker  . Smokeless tobacco: Never Used  Substance Use Topics  . Alcohol use: No    Alcohol/week: 0.0 oz  . Drug use: No     Medication list has been reviewed and updated.  Current Meds  Medication Sig  . aspirin 81 MG chewable tablet Chew 1 tablet by mouth daily.  Marland Kitchen glimepiride (AMARYL) 2 MG tablet TAKE 1 TAB 2 TIMES A DAY BEFORE MEALS  . glucose blood (ONE TOUCH ULTRA TEST) test strip 1 each by Other route 2 (two) times daily.  Marland Kitchen JANUMET XR 50-1000 MG TB24 TAKE 2 TABLETS BY MOUTH DAILY.  . metoprolol succinate (TOPROL-XL) 25 MG 24 hr tablet TAKE 1 TABLET BY MOUTH EVERY DAY  . Multiple Vitamins-Minerals (MULTIVITAL) tablet Take 1 tablet by mouth daily.  Marland Kitchen olmesartan-hydrochlorothiazide (BENICAR HCT) 20-12.5 MG tablet TAKE 1 TABLET BY MOUTH EVERY DAY  . omega-3 acid ethyl esters (LOVAZA) 1 g capsule TAKE 4 CAPSULES BY MOUTH DAILY  . simvastatin (ZOCOR) 80 MG tablet TAKE 1 TABLET BY MOUTH EVERY DAY    PHQ 2/9 Scores 03/23/2018 04/09/2017 11/19/2015 05/17/2015  PHQ - 2 Score 0 0 0 0   Fall Risk  03/23/2018 04/09/2017 11/19/2015 05/17/2015  Falls in the past year? No No No No    Physical Exam  Constitutional: She is oriented to person, place, and time. She appears well-developed and well-nourished. No distress.  HENT:  Head:  Normocephalic and atraumatic.  Right Ear: Tympanic membrane and ear canal normal.  Left Ear: Tympanic membrane and ear canal normal.  Nose: Right sinus exhibits no maxillary sinus tenderness. Left sinus exhibits no maxillary sinus tenderness.  Mouth/Throat: Uvula is midline and oropharynx is clear and moist.  Eyes: Conjunctivae and EOM are normal. Right eye exhibits no discharge. Left eye exhibits no discharge. No scleral icterus.  Neck: Normal range of motion. Carotid bruit is not present. No erythema present. No thyromegaly present.  Cardiovascular: Normal rate, regular rhythm, normal heart sounds and normal pulses.  Pulmonary/Chest: Effort normal. No respiratory distress. She has no wheezes. Right breast exhibits no mass, no nipple discharge, no skin change and no tenderness. Left breast exhibits no mass, no nipple discharge, no skin change and no tenderness.  Pacemaker site left upper chest intact  Abdominal: Soft. Bowel sounds are normal. There is no hepatosplenomegaly. There is no tenderness. There is no CVA tenderness.  Musculoskeletal: Normal range of motion.  Lymphadenopathy:    She has no cervical adenopathy.    She has no axillary adenopathy.  Neurological: She is alert and oriented to person, place, and time. She has normal reflexes. No cranial nerve  deficit or sensory deficit.  Skin: Skin is warm, dry and intact. No rash noted.  Psychiatric: She has a normal mood and affect. Her speech is normal and behavior is normal. Thought content normal.  Nursing note and vitals reviewed.   BP 138/84   Pulse 62   Temp 97.9 F (36.6 C) (Oral)   Resp 16   Ht 5\' 6"  (1.676 m)   Wt 211 lb 9.6 oz (96 kg)   SpO2 96%   BMI 34.15 kg/m   Assessment and Plan: 1. Annual physical exam Pt encouraged to begin exercise program and work on diet - POCT urinalysis dipstick  2. Encounter for screening mammogram for breast cancer Pt will schedule at [place]. Uncontrolled type 2 diabetes  mellitus without complication, without long-term current use of insulin ([place]) Continue oral agents Work on diet and exercise May need to add another agent - Comprehensive metabolic panel - Hemoglobin A1c - TSH  4. Supraventricular tachycardia (HCC) Controlled on beta blocker  5. Essential (primary) hypertension controlled - CBC with Differential/Platelet  6. Hypercholesteremia On statin therapy - Lipid panel  7. Colon cancer screening Pt due for colonoscopy -  - Fecal occult blood, imunochemical  8. Ovarian failure Pt will schedule with her mammogram at [facility]   No orders of the defined types were placed in this encounter.   Partially dictated using Editor, commissioning. Any errors are unintentional.  Halina Maidens, MD [facility]  03/23/2018   There are no diagnoses linked to this encounter.

## 2018-03-24 LAB — COMPREHENSIVE METABOLIC PANEL
A/G RATIO: 2.2 (ref 1.2–2.2)
ALBUMIN: 4.8 g/dL (ref 3.5–4.8)
ALT: 42 IU/L — AB (ref 0–32)
AST: 39 IU/L (ref 0–40)
Alkaline Phosphatase: 52 IU/L (ref 39–117)
BILIRUBIN TOTAL: 0.6 mg/dL (ref 0.0–1.2)
BUN / CREAT RATIO: 19 (ref 12–28)
BUN: 22 mg/dL (ref 8–27)
CALCIUM: 10.5 mg/dL — AB (ref 8.7–10.3)
CHLORIDE: 98 mmol/L (ref 96–106)
CO2: 19 mmol/L — ABNORMAL LOW (ref 20–29)
Creatinine, Ser: 1.14 mg/dL — ABNORMAL HIGH (ref 0.57–1.00)
GFR calc non Af Amer: 48 mL/min/{1.73_m2} — ABNORMAL LOW (ref >59)
GFR, EST AFRICAN AMERICAN: 56 mL/min/{1.73_m2} — AB (ref >59)
GLOBULIN, TOTAL: 2.2 g/dL (ref 1.5–4.5)
Glucose: 295 mg/dL — ABNORMAL HIGH (ref 65–99)
Potassium: 4.7 mmol/L (ref 3.5–5.2)
SODIUM: 138 mmol/L (ref 134–144)
TOTAL PROTEIN: 7 g/dL (ref 6.0–8.5)

## 2018-03-24 LAB — CBC WITH DIFFERENTIAL/PLATELET
BASOS: 1 %
Basophils Absolute: 0 10*3/uL (ref 0.0–0.2)
EOS (ABSOLUTE): 0.1 10*3/uL (ref 0.0–0.4)
EOS: 1 %
HEMATOCRIT: 43 % (ref 34.0–46.6)
HEMOGLOBIN: 14.5 g/dL (ref 11.1–15.9)
Immature Grans (Abs): 0 10*3/uL (ref 0.0–0.1)
Immature Granulocytes: 0 %
LYMPHS: 26 %
Lymphocytes Absolute: 1.8 10*3/uL (ref 0.7–3.1)
MCH: 30.4 pg (ref 26.6–33.0)
MCHC: 33.7 g/dL (ref 31.5–35.7)
MCV: 90 fL (ref 79–97)
Monocytes Absolute: 0.5 10*3/uL (ref 0.1–0.9)
Monocytes: 7 %
NEUTROS ABS: 4.3 10*3/uL (ref 1.4–7.0)
Neutrophils: 65 %
Platelets: 195 10*3/uL (ref 150–450)
RBC: 4.77 x10E6/uL (ref 3.77–5.28)
RDW: 13.7 % (ref 12.3–15.4)
WBC: 6.6 10*3/uL (ref 3.4–10.8)

## 2018-03-24 LAB — LIPID PANEL
CHOL/HDL RATIO: 3 ratio (ref 0.0–4.4)
CHOLESTEROL TOTAL: 139 mg/dL (ref 100–199)
HDL: 47 mg/dL (ref >39)
LDL CALC: 33 mg/dL (ref 0–99)
Triglycerides: 295 mg/dL — ABNORMAL HIGH (ref 0–149)
VLDL CHOLESTEROL CAL: 59 mg/dL — AB (ref 5–40)

## 2018-03-24 LAB — TSH: TSH: 2.82 u[IU]/mL (ref 0.450–4.500)

## 2018-03-24 LAB — HEMOGLOBIN A1C
Est. average glucose Bld gHb Est-mCnc: 223 mg/dL
Hgb A1c MFr Bld: 9.4 % — ABNORMAL HIGH (ref 4.8–5.6)

## 2018-03-26 ENCOUNTER — Other Ambulatory Visit: Payer: Self-pay | Admitting: Internal Medicine

## 2018-03-26 MED ORDER — EMPAGLIFLOZIN 10 MG PO TABS: 10.0000 mg | ORAL_TABLET | Freq: Every day | ORAL | 5 refills | Status: DC

## 2018-03-26 NOTE — Progress Notes (Unsigned)
j

## 2018-04-22 ENCOUNTER — Other Ambulatory Visit: Payer: Self-pay | Admitting: Internal Medicine

## 2018-04-22 ENCOUNTER — Telehealth: Payer: Self-pay

## 2018-04-22 MED ORDER — FLUCONAZOLE 100 MG PO TABS: 100.0000 mg | ORAL_TABLET | Freq: Every day | ORAL | 0 refills | Status: DC

## 2018-04-22 NOTE — Telephone Encounter (Signed)
Patient will try diflucan for 3 days per berglund request and not a week. She will call back if sx return

## 2018-04-22 NOTE — Telephone Encounter (Signed)
Patient called and stated she is having yeast infection and low energy possibly due to new Rx Jardiance. Start date 03/26/2018.  LOV 03/23/2018 Does she need to be seen for further evaluation or stop current regimen.

## 2018-04-22 NOTE — Telephone Encounter (Signed)
We need to try taking diflucan for a week, but continue the Jardiance.  If the sx do not resolve, or if they return, we will discuss other medication.  I will send Rx to pharmacy.

## 2018-05-10 ENCOUNTER — Other Ambulatory Visit: Payer: Self-pay | Admitting: Internal Medicine

## 2018-05-15 LAB — HM COLONOSCOPY

## 2018-05-27 ENCOUNTER — Encounter: Payer: Self-pay | Admitting: Internal Medicine

## 2018-06-08 DIAGNOSIS — H35372 Puckering of macula, left eye: Secondary | ICD-10-CM | POA: Diagnosis not present

## 2018-06-08 DIAGNOSIS — H47093 Other disorders of optic nerve, not elsewhere classified, bilateral: Secondary | ICD-10-CM | POA: Diagnosis not present

## 2018-06-08 DIAGNOSIS — Z961 Presence of intraocular lens: Secondary | ICD-10-CM | POA: Diagnosis not present

## 2018-06-08 DIAGNOSIS — H5203 Hypermetropia, bilateral: Secondary | ICD-10-CM | POA: Diagnosis not present

## 2018-06-11 ENCOUNTER — Other Ambulatory Visit: Payer: Self-pay

## 2018-06-11 DIAGNOSIS — E1165 Type 2 diabetes mellitus with hyperglycemia: Principal | ICD-10-CM

## 2018-06-11 DIAGNOSIS — IMO0001 Reserved for inherently not codable concepts without codable children: Secondary | ICD-10-CM

## 2018-06-11 MED ORDER — GLUCOSE BLOOD VI STRP: 1.0000 | ORAL_STRIP | Freq: Two times a day (BID) | 3 refills | Status: AC

## 2018-07-26 ENCOUNTER — Encounter: Payer: Self-pay | Admitting: Internal Medicine

## 2018-07-26 ENCOUNTER — Ambulatory Visit (INDEPENDENT_AMBULATORY_CARE_PROVIDER_SITE_OTHER): Payer: Medicare Other | Admitting: Internal Medicine

## 2018-07-26 VITALS — BP 122/78 | HR 66 | Ht 68.0 in | Wt 198.0 lb

## 2018-07-26 DIAGNOSIS — E1159 Type 2 diabetes mellitus with other circulatory complications: Secondary | ICD-10-CM

## 2018-07-26 DIAGNOSIS — I10 Essential (primary) hypertension: Secondary | ICD-10-CM

## 2018-07-26 DIAGNOSIS — E785 Hyperlipidemia, unspecified: Secondary | ICD-10-CM

## 2018-07-26 DIAGNOSIS — E1169 Type 2 diabetes mellitus with other specified complication: Secondary | ICD-10-CM

## 2018-07-26 NOTE — Patient Instructions (Signed)
Janumet = Januvia and metformin  (check on metformin ER)  Tradjenta Nesina Onglyza  Jardiance alternatives:  Augustina Mood

## 2018-07-26 NOTE — Progress Notes (Signed)
Date:  07/26/2018   Name:  Kimberly Salazar   DOB:  1946-02-08   MRN:  [id number]   Chief Complaint: Diabetes and Hypertension Pt retired and has medicare now.  Her medications are very expensive even though she has a BCBS supplemental.  Diabetes  She presents for her follow-up diabetic visit. She has type 2 diabetes mellitus. Pertinent negatives for hypoglycemia include no headaches or tremors. Pertinent negatives for diabetes include no chest pain, no fatigue, no polydipsia and no polyuria. Symptoms are stable. Current diabetic treatments: jardiance, janumet, glimepiride. She is compliant with treatment all of the time. Her weight is stable. She is following a generally healthy diet. She monitors blood glucose at home 1-2 x per week. Her breakfast blood glucose is taken between 7-8 am. Her breakfast blood glucose range is generally 110-130 mg/dl. An ACE inhibitor/angiotensin II receptor blocker is being taken.  Hypertension  This is a chronic problem. The problem is unchanged. The problem is controlled. Pertinent negatives include no chest pain, headaches, palpitations or shortness of breath. Past treatments include angiotensin blockers.  Hyperlipidemia  This is a chronic problem. Pertinent negatives include no chest pain or shortness of breath. Current antihyperlipidemic treatment includes statins. The current treatment provides significant improvement of lipids. There are no compliance problems.     Review of Systems  Constitutional: Negative for appetite change, fatigue, fever and unexpected weight change.  HENT: Negative for tinnitus and trouble swallowing.   Eyes: Negative for visual disturbance.  Respiratory: Negative for cough, chest tightness and shortness of breath.   Cardiovascular: Negative for chest pain, palpitations and leg swelling.  Gastrointestinal: Negative for abdominal pain.  Endocrine: Negative for polydipsia and polyuria.  Genitourinary: Negative for dysuria and  hematuria.  Musculoskeletal: Negative for arthralgias.  Neurological: Negative for tremors, numbness and headaches.  Psychiatric/Behavioral: Negative for dysphoric mood.    Patient Active Problem List   Diagnosis Date Noted  . Ovarian failure 04/09/2017  . Basal cell carcinoma, leg 07/15/2016  . Abnormal finding on thallium stress test 05/17/2015  . Supraventricular tachycardia ([place]) 05/17/2015  . Hyperlipidemia associated with type 2 diabetes mellitus ([place]) 04/12/2015  . Essential (primary) hypertension 04/12/2015  . History of uterine cancer 04/12/2015  . Type 2 diabetes mellitus with other circulatory complications ([place]) 78/24/2353  . Cardiac pacemaker in situ 10/11/2009    No Known Allergies  Past Surgical History:  Procedure Laterality Date  . ABDOMINAL HYSTERECTOMY  1994   Uterine CA  . APPENDECTOMY    . CATARACT EXTRACTION Left 2011  . LITHOTRIPSY  2005  . PACEMAKER INSERTION  2010    Social History   Tobacco Use  . Smoking status: Never Smoker  . Smokeless tobacco: Never Used  Substance Use Topics  . Alcohol use: No    Alcohol/week: 0.0 standard drinks  . Drug use: No     Medication list has been reviewed and updated.  Current Meds  Medication Sig  . aspirin 81 MG chewable tablet Chew 1 tablet by mouth daily.  . empagliflozin (JARDIANCE) 10 MG TABS tablet Take 10 mg by mouth daily.  Marland Kitchen glimepiride (AMARYL) 2 MG tablet TAKE 1 TAB 2 TIMES A DAY BEFORE MEALS  . glucose blood (ONE TOUCH ULTRA TEST) test strip 1 each by Other route 2 (two) times daily.  Marland Kitchen JANUMET XR 50-1000 MG TB24 TAKE 2 TABLETS BY MOUTH DAILY.  . metoprolol succinate (TOPROL-XL) 25 MG 24 hr tablet TAKE 1 TABLET BY MOUTH EVERY DAY  . Multiple  Vitamins-Minerals (MULTIVITAL) tablet Take 1 tablet by mouth daily.  Marland Kitchen olmesartan-hydrochlorothiazide (BENICAR HCT) 20-12.5 MG tablet TAKE 1 TABLET BY MOUTH EVERY DAY  . omega-3 acid ethyl esters (LOVAZA) 1 g capsule TAKE 4 CAPSULES BY MOUTH DAILY  .  simvastatin (ZOCOR) 80 MG tablet TAKE 1 TABLET BY MOUTH EVERY DAY  . [DISCONTINUED] olmesartan-hydrochlorothiazide (BENICAR HCT) 20-12.5 MG tablet TAKE 1 TABLET BY MOUTH EVERY DAY    PHQ 2/9 Scores 03/23/2018 04/09/2017 11/19/2015 05/17/2015  PHQ - 2 Score 0 0 0 0    Physical Exam  Constitutional: She is oriented to person, place, and time. She appears well-developed. No distress.  HENT:  Head: Normocephalic and atraumatic.  Neck: Normal range of motion. Neck supple.  Cardiovascular: Normal rate, regular rhythm and normal heart sounds.  Pulses:      Dorsalis pedis pulses are 2+ on the right side, and 2+ on the left side.  Pulmonary/Chest: Effort normal and breath sounds normal. No respiratory distress.  Pacemaker in left upper chest  Musculoskeletal: Normal range of motion. She exhibits no edema or tenderness.  Neurological: She is alert and oriented to person, place, and time.  Skin: Skin is warm and dry. No rash noted.  Psychiatric: She has a normal mood and affect. Her behavior is normal. Thought content normal.  Nursing note and vitals reviewed.   BP 122/78 (BP Location: Right Arm, Patient Position: Sitting, Cuff Size: Normal)   Pulse 66   Ht 5\' 8"  (1.727 m)   Wt 198 lb (89.8 kg)   SpO2 98%   BMI 30.11 kg/m   Assessment and Plan: 1. Essential (primary) hypertension controlled  2. Type 2 diabetes mellitus with other circulatory complications (HCC) Continue current medications Check on lower cost alternatives (list provided)  3. Hyperlipidemia associated with type 2 diabetes mellitus ([place]) On statin therapy plus Vascepa   Partially dictated using Editor, commissioning. Any errors are unintentional.  Halina Maidens, MD [facility]  07/26/2018

## 2018-07-27 LAB — COMPREHENSIVE METABOLIC PANEL
A/G RATIO: 2 (ref 1.2–2.2)
ALBUMIN: 4.8 g/dL (ref 3.5–4.8)
ALT: 38 IU/L — AB (ref 0–32)
AST: 38 IU/L (ref 0–40)
Alkaline Phosphatase: 52 IU/L (ref 39–117)
BUN/Creatinine Ratio: 17 (ref 12–28)
BUN: 18 mg/dL (ref 8–27)
Bilirubin Total: 0.7 mg/dL (ref 0.0–1.2)
CALCIUM: 9.9 mg/dL (ref 8.7–10.3)
CO2: 19 mmol/L — ABNORMAL LOW (ref 20–29)
Chloride: 99 mmol/L (ref 96–106)
Creatinine, Ser: 1.06 mg/dL — ABNORMAL HIGH (ref 0.57–1.00)
GFR calc Af Amer: 60 mL/min/{1.73_m2} (ref >59)
GFR, EST NON AFRICAN AMERICAN: 52 mL/min/{1.73_m2} — AB (ref >59)
GLOBULIN, TOTAL: 2.4 g/dL (ref 1.5–4.5)
Glucose: 232 mg/dL — ABNORMAL HIGH (ref 65–99)
Potassium: 4.6 mmol/L (ref 3.5–5.2)
Sodium: 139 mmol/L (ref 134–144)
TOTAL PROTEIN: 7.2 g/dL (ref 6.0–8.5)

## 2018-07-27 LAB — HEMOGLOBIN A1C
ESTIMATED AVERAGE GLUCOSE: 197 mg/dL
Hgb A1c MFr Bld: 8.5 % — ABNORMAL HIGH (ref 4.8–5.6)

## 2018-08-19 ENCOUNTER — Other Ambulatory Visit: Payer: Self-pay

## 2018-08-19 MED ORDER — FLUCONAZOLE 100 MG PO TABS: 100.0000 mg | ORAL_TABLET | Freq: Every day | ORAL | 0 refills | Status: DC

## 2018-08-30 DIAGNOSIS — I495 Sick sinus syndrome: Secondary | ICD-10-CM | POA: Diagnosis not present

## 2018-08-30 DIAGNOSIS — Z45018 Encounter for adjustment and management of other part of cardiac pacemaker: Secondary | ICD-10-CM | POA: Diagnosis not present

## 2018-08-30 DIAGNOSIS — Z950 Presence of cardiac pacemaker: Secondary | ICD-10-CM | POA: Diagnosis not present

## 2018-09-17 ENCOUNTER — Other Ambulatory Visit: Payer: Self-pay | Admitting: Internal Medicine

## 2018-09-17 ENCOUNTER — Telehealth: Payer: Self-pay

## 2018-09-17 DIAGNOSIS — IMO0001 Reserved for inherently not codable concepts without codable children: Secondary | ICD-10-CM

## 2018-09-17 DIAGNOSIS — E1165 Type 2 diabetes mellitus with hyperglycemia: Principal | ICD-10-CM

## 2018-09-17 MED ORDER — SITAGLIP PHOS-METFORMIN HCL ER 50-1000 MG PO TB24: 2.0000 | ORAL_TABLET | Freq: Every day | ORAL | 12 refills | Status: DC

## 2018-09-17 NOTE — Telephone Encounter (Signed)
Would like to just stay on Janumet and wants sent to [facility] [address].

## 2018-09-17 NOTE — Telephone Encounter (Signed)
Would like to ADD Metformin and remain on Janumentt. Is this okay?

## 2018-09-17 NOTE — Telephone Encounter (Signed)
She can stay on Jardiance.  She can take metformin alone but she needs to chose an alternative to [place] to take as well.  (onglyza, tradjenta, nesina)

## 2018-09-30 DIAGNOSIS — Z23 Encounter for immunization: Secondary | ICD-10-CM | POA: Diagnosis not present

## 2018-10-14 ENCOUNTER — Other Ambulatory Visit: Payer: Self-pay

## 2018-10-14 MED ORDER — SIMVASTATIN 80 MG PO TABS: 80.0000 mg | ORAL_TABLET | Freq: Every day | ORAL | 1 refills | Status: DC

## 2018-11-25 ENCOUNTER — Ambulatory Visit: Payer: Medicare Other | Admitting: Internal Medicine

## 2018-12-01 ENCOUNTER — Ambulatory Visit (INDEPENDENT_AMBULATORY_CARE_PROVIDER_SITE_OTHER): Payer: Medicare Other | Admitting: Internal Medicine

## 2018-12-01 ENCOUNTER — Encounter: Payer: Self-pay | Admitting: Internal Medicine

## 2018-12-01 ENCOUNTER — Other Ambulatory Visit: Payer: Self-pay

## 2018-12-01 VITALS — BP 122/78 | HR 62 | Ht 68.0 in | Wt 195.0 lb

## 2018-12-01 DIAGNOSIS — B3731 Acute candidiasis of vulva and vagina: Secondary | ICD-10-CM

## 2018-12-01 DIAGNOSIS — E1159 Type 2 diabetes mellitus with other circulatory complications: Secondary | ICD-10-CM | POA: Diagnosis not present

## 2018-12-01 DIAGNOSIS — B373 Candidiasis of vulva and vagina: Secondary | ICD-10-CM | POA: Diagnosis not present

## 2018-12-01 DIAGNOSIS — I10 Essential (primary) hypertension: Secondary | ICD-10-CM | POA: Diagnosis not present

## 2018-12-01 MED ORDER — OLMESARTAN MEDOXOMIL-HCTZ 20-12.5 MG PO TABS: 1.0000 | ORAL_TABLET | Freq: Every day | ORAL | 3 refills | Status: DC

## 2018-12-01 MED ORDER — FLUCONAZOLE 100 MG PO TABS: 100.0000 mg | ORAL_TABLET | Freq: Every day | ORAL | 0 refills | Status: AC

## 2018-12-01 NOTE — Patient Instructions (Signed)
Stop Jardiance when supply runs out.  Hold simvastatin while taking diflucan.

## 2018-12-01 NOTE — Progress Notes (Signed)
Date:  12/01/2018   Name:  Kimberly Salazar   DOB:  07-15-1946   MRN:  [id number]   Chief Complaint: Diabetes  Diabetes  She presents for her follow-up diabetic visit. She has type 2 diabetes mellitus. Her disease course has been stable. Pertinent negatives for hypoglycemia include no headaches or tremors. Pertinent negatives for diabetes include no chest pain, no fatigue, no polydipsia and no polyuria. Current diabetic treatment includes oral agent (triple therapy). She is compliant with treatment all of the time. An ACE inhibitor/angiotensin II receptor blocker is being taken. Eye exam is not current.  Hypertension  This is a chronic problem. The problem is controlled. Pertinent negatives include no chest pain, headaches, palpitations or shortness of breath. Past treatments include angiotensin blockers, beta blockers and diuretics. The current treatment provides significant improvement.  Vaginal Itching  The patient's primary symptoms include genital itching. This is a recurrent problem. The current episode started more than 1 month ago. The problem occurs every several days. The problem has been unchanged. The patient is experiencing no pain. Pertinent negatives include no abdominal pain, dysuria, fever, headaches, hematuria or rash. She has tried antifungals for the symptoms. (Taking Jardiance)   Lab Results  Component Value Date   HGBA1C 8.5 (H) 07/26/2018    Review of Systems  Constitutional: Negative for appetite change, fatigue, fever and unexpected weight change.  HENT: Negative for tinnitus and trouble swallowing.   Eyes: Negative for visual disturbance.  Respiratory: Negative for cough, chest tightness and shortness of breath.   Cardiovascular: Negative for chest pain, palpitations and leg swelling.  Gastrointestinal: Negative for abdominal pain.  Endocrine: Negative for polydipsia and polyuria.  Genitourinary: Negative for dysuria and hematuria.       Vaginal itching and  discomfort  Musculoskeletal: Negative for arthralgias.  Skin: Negative for color change and rash.  Neurological: Negative for tremors, numbness and headaches.  Psychiatric/Behavioral: Negative for dysphoric mood.    Patient Active Problem List   Diagnosis Date Noted  . Ovarian failure 04/09/2017  . Basal cell carcinoma, leg 07/15/2016  . Abnormal finding on thallium stress test 05/17/2015  . Supraventricular tachycardia ([place]) 05/17/2015  . Hyperlipidemia associated with type 2 diabetes mellitus ([place]) 04/12/2015  . Essential (primary) hypertension 04/12/2015  . History of uterine cancer 04/12/2015  . Type 2 diabetes mellitus with other circulatory complications ([place]) 40/98/1191  . Cardiac pacemaker in situ 10/11/2009    No Known Allergies  Past Surgical History:  Procedure Laterality Date  . ABDOMINAL HYSTERECTOMY  1994   Uterine CA  . APPENDECTOMY    . CATARACT EXTRACTION Left 2011  . LITHOTRIPSY  2005  . PACEMAKER INSERTION  2010    Social History   Tobacco Use  . Smoking status: Never Smoker  . Smokeless tobacco: Never Used  Substance Use Topics  . Alcohol use: No    Alcohol/week: 0.0 standard drinks  . Drug use: No     Medication list has been reviewed and updated.  Current Meds  Medication Sig  . aspirin 81 MG chewable tablet Chew 1 tablet by mouth daily.  . empagliflozin (JARDIANCE) 10 MG TABS tablet Take 10 mg by mouth daily.  Marland Kitchen glimepiride (AMARYL) 2 MG tablet TAKE 1 TAB 2 TIMES A DAY BEFORE MEALS  . glucose blood (ONE TOUCH ULTRA TEST) test strip 1 each by Other route 2 (two) times daily.  . metoprolol succinate (TOPROL-XL) 25 MG 24 hr tablet TAKE 1 TABLET BY MOUTH EVERY DAY  .  Multiple Vitamins-Minerals (MULTIVITAL) tablet Take 1 tablet by mouth daily.  Marland Kitchen olmesartan-hydrochlorothiazide (BENICAR HCT) 20-12.5 MG tablet TAKE 1 TABLET BY MOUTH EVERY DAY  . omega-3 acid ethyl esters (LOVAZA) 1 g capsule TAKE 4 CAPSULES BY MOUTH DAILY  . simvastatin (ZOCOR)  80 MG tablet Take 1 tablet (80 mg total) by mouth daily. Take 1 tablet by mouth daily.  . SitaGLIPtin-MetFORMIN HCl (JANUMET XR) 50-1000 MG TB24 Take 2 tablets by mouth daily.    PHQ 2/9 Scores 12/01/2018 03/23/2018 04/09/2017 11/19/2015  PHQ - 2 Score 0 0 0 0   Wt Readings from Last 3 Encounters:  12/01/18 195 lb (88.5 kg)  07/26/18 198 lb (89.8 kg)  03/23/18 211 lb 9.6 oz (96 kg)    Physical Exam Vitals signs and nursing note reviewed.  Constitutional:      General: She is not in acute distress.    Appearance: She is well-developed.  HENT:     Head: Normocephalic and atraumatic.     Mouth/Throat:     Mouth: Mucous membranes are moist.  Neck:     Musculoskeletal: Normal range of motion and neck supple.  Cardiovascular:     Rate and Rhythm: Normal rate and regular rhythm.     Pulses: Normal pulses.  Pulmonary:     Effort: Pulmonary effort is normal. No respiratory distress.     Breath sounds: Normal breath sounds.  Musculoskeletal: Normal range of motion.     Right lower leg: No edema.     Left lower leg: No edema.  Skin:    General: Skin is warm and dry.     Findings: No rash.  Neurological:     Mental Status: She is alert and oriented to person, place, and time.  Psychiatric:        Behavior: Behavior normal.        Thought Content: Thought content normal.     BP 122/78   Pulse 62   Ht 5\' 8"  (1.727 m)   Wt 195 lb (88.5 kg)   SpO2 99%   BMI 29.65 kg/m   Assessment and Plan: 1. Type 2 diabetes mellitus with other circulatory complications ([place]) Not tolerating Jardiance Will d/c, work harder on diet, exercise and weight loss Consider adding GLP-1 or basal insulin next visit - Hemoglobin W1U - Basic metabolic panel  2. Essential (primary) hypertension controlled - olmesartan-hydrochlorothiazide (BENICAR HCT) 20-12.5 MG tablet; Take 1 tablet by mouth daily.  Dispense: 90 tablet; Refill: 3  3. Yeast vaginitis Hold simvastatin while taking diflucan -  fluconazole (DIFLUCAN) 100 MG tablet; Take 1 tablet (100 mg total) by mouth daily for 3 days. Take one tab every other day for 3 doses  Dispense: 3 tablet; Refill: 0   Partially dictated using Editor, commissioning. Any errors are unintentional.  Halina Maidens, MD [facility]  12/01/2018

## 2018-12-02 LAB — BASIC METABOLIC PANEL
BUN/Creatinine Ratio: 21 (ref 12–28)
BUN: 22 mg/dL (ref 8–27)
CO2: 22 mmol/L (ref 20–29)
Calcium: 10 mg/dL (ref 8.7–10.3)
Chloride: 100 mmol/L (ref 96–106)
Creatinine, Ser: 1.05 mg/dL — ABNORMAL HIGH (ref 0.57–1.00)
GFR calc Af Amer: 61 mL/min/{1.73_m2} (ref >59)
GFR calc non Af Amer: 53 mL/min/{1.73_m2} — ABNORMAL LOW (ref >59)
Glucose: 253 mg/dL — ABNORMAL HIGH (ref 65–99)
Potassium: 4.5 mmol/L (ref 3.5–5.2)
Sodium: 142 mmol/L (ref 134–144)

## 2018-12-02 LAB — HEMOGLOBIN A1C
Est. average glucose Bld gHb Est-mCnc: 177 mg/dL
Hgb A1c MFr Bld: 7.8 % — ABNORMAL HIGH (ref 4.8–5.6)

## 2018-12-03 DIAGNOSIS — Z950 Presence of cardiac pacemaker: Secondary | ICD-10-CM | POA: Diagnosis not present

## 2018-12-03 DIAGNOSIS — I495 Sick sinus syndrome: Secondary | ICD-10-CM | POA: Diagnosis not present

## 2018-12-03 DIAGNOSIS — Z45018 Encounter for adjustment and management of other part of cardiac pacemaker: Secondary | ICD-10-CM | POA: Diagnosis not present

## 2019-02-11 ENCOUNTER — Other Ambulatory Visit: Payer: Self-pay | Admitting: Internal Medicine

## 2019-02-28 ENCOUNTER — Encounter: Payer: Medicare Other | Admitting: Internal Medicine

## 2019-03-04 DIAGNOSIS — I495 Sick sinus syndrome: Secondary | ICD-10-CM | POA: Diagnosis not present

## 2019-03-04 DIAGNOSIS — Z45018 Encounter for adjustment and management of other part of cardiac pacemaker: Secondary | ICD-10-CM | POA: Diagnosis not present

## 2019-03-04 DIAGNOSIS — Z950 Presence of cardiac pacemaker: Secondary | ICD-10-CM | POA: Diagnosis not present

## 2019-04-21 ENCOUNTER — Other Ambulatory Visit: Payer: Self-pay | Admitting: Internal Medicine

## 2019-04-22 ENCOUNTER — Other Ambulatory Visit: Payer: Self-pay | Admitting: Internal Medicine

## 2019-05-03 ENCOUNTER — Encounter: Payer: Self-pay | Admitting: Internal Medicine

## 2019-05-03 ENCOUNTER — Ambulatory Visit (INDEPENDENT_AMBULATORY_CARE_PROVIDER_SITE_OTHER): Payer: Medicare Other | Admitting: Internal Medicine

## 2019-05-03 ENCOUNTER — Other Ambulatory Visit: Payer: Self-pay

## 2019-05-03 VITALS — BP 116/70 | HR 60 | Temp 97.1°F | Ht 68.0 in | Wt 203.0 lb

## 2019-05-03 DIAGNOSIS — E1169 Type 2 diabetes mellitus with other specified complication: Secondary | ICD-10-CM

## 2019-05-03 DIAGNOSIS — Z780 Asymptomatic menopausal state: Secondary | ICD-10-CM | POA: Diagnosis not present

## 2019-05-03 DIAGNOSIS — Z1231 Encounter for screening mammogram for malignant neoplasm of breast: Secondary | ICD-10-CM | POA: Diagnosis not present

## 2019-05-03 DIAGNOSIS — E1159 Type 2 diabetes mellitus with other circulatory complications: Secondary | ICD-10-CM

## 2019-05-03 DIAGNOSIS — Z23 Encounter for immunization: Secondary | ICD-10-CM

## 2019-05-03 DIAGNOSIS — I10 Essential (primary) hypertension: Secondary | ICD-10-CM

## 2019-05-03 DIAGNOSIS — Z13820 Encounter for screening for osteoporosis: Secondary | ICD-10-CM | POA: Diagnosis not present

## 2019-05-03 DIAGNOSIS — E785 Hyperlipidemia, unspecified: Secondary | ICD-10-CM

## 2019-05-03 LAB — POCT URINALYSIS DIPSTICK
Bilirubin, UA: NEGATIVE
Blood, UA: NEGATIVE
Glucose, UA: NEGATIVE
Ketones, UA: NEGATIVE
Leukocytes, UA: NEGATIVE
Nitrite, UA: NEGATIVE
Protein, UA: NEGATIVE
Spec Grav, UA: 1.025 (ref 1.010–1.025)
Urobilinogen, UA: 0.2 E.U./dL
pH, UA: 6 (ref 5.0–8.0)

## 2019-05-03 MED ORDER — SHINGRIX 50 MCG/0.5ML IM SUSR: 0.5000 mL | Freq: Once | INTRAMUSCULAR | 1 refills | Status: AC

## 2019-05-03 NOTE — Progress Notes (Signed)
Date:  05/03/2019   Name:  Kimberly Salazar   DOB:  03/23/1946   MRN:  [id number]   Chief Complaint: Annual Exam [name] is a 73 y.o. female who presents today for her annual exam. She feels well. She reports exercising some. She reports she is sleeping fairly well.   Mammogram 03/2018 Colonoscopy 2019 Immunizations up to date DEXA none  Hypertension This is a chronic problem. The problem is controlled. Pertinent negatives include no chest pain, headaches, palpitations or shortness of breath. Past treatments include angiotensin blockers, diuretics and beta blockers. The current treatment provides significant improvement.  Diabetes She presents for her follow-up diabetic visit. She has type 2 diabetes mellitus. Her disease course has been improving. Pertinent negatives for hypoglycemia include no dizziness, headaches, nervousness/anxiousness or tremors. Pertinent negatives for diabetes include no chest pain, no fatigue, no polydipsia, no polyuria and no weakness. Current diabetic treatment includes oral agent (triple therapy). She is compliant with treatment all of the time.  Hyperlipidemia The problem is controlled. Pertinent negatives include no chest pain or shortness of breath. Current antihyperlipidemic treatment includes statins. The current treatment provides significant improvement of lipids.   Lab Results  Component Value Date   HGBA1C 7.8 (H) 12/01/2018   Lab Results  Component Value Date   CREATININE 1.05 (H) 12/01/2018   BUN 22 12/01/2018   NA 142 12/01/2018   K 4.5 12/01/2018   CL 100 12/01/2018   CO2 22 12/01/2018   Lab Results  Component Value Date   CHOL 139 03/23/2018   HDL 47 03/23/2018   LDLCALC 33 03/23/2018   TRIG 295 (H) 03/23/2018   CHOLHDL 3.0 03/23/2018    Review of Systems  Constitutional: Negative for chills, fatigue and fever.  HENT: Negative for congestion, hearing loss, tinnitus, trouble swallowing and voice change.   Eyes: Negative for  visual disturbance.  Respiratory: Negative for cough, chest tightness, shortness of breath and wheezing.   Cardiovascular: Negative for chest pain, palpitations and leg swelling.  Gastrointestinal: Negative for abdominal pain, constipation, diarrhea and vomiting.  Endocrine: Negative for polydipsia and polyuria.  Genitourinary: Negative for dysuria, frequency, genital sores, vaginal bleeding and vaginal discharge.  Musculoskeletal: Negative for arthralgias, gait problem and joint swelling.  Skin: Negative for color change and rash.  Allergic/Immunologic: Negative for environmental allergies.  Neurological: Negative for dizziness, tremors, weakness, light-headedness and headaches.  Hematological: Negative for adenopathy. Does not bruise/bleed easily.  Psychiatric/Behavioral: Negative for dysphoric mood and sleep disturbance. The patient is not nervous/anxious.     Patient Active Problem List   Diagnosis Date Noted  . Ovarian failure 04/09/2017  . Basal cell carcinoma, leg 07/15/2016  . Abnormal finding on thallium stress test 05/17/2015  . Supraventricular tachycardia ([place]) 05/17/2015  . Hyperlipidemia associated with type 2 diabetes mellitus ([place]) 04/12/2015  . Essential (primary) hypertension 04/12/2015  . History of uterine cancer 04/12/2015  . Type 2 diabetes mellitus with other circulatory complications ([place]) 01/15/2247  . Cardiac pacemaker in situ 10/11/2009    No Known Allergies  Past Surgical History:  Procedure Laterality Date  . ABDOMINAL HYSTERECTOMY  1994   Uterine CA  . APPENDECTOMY    . CATARACT EXTRACTION Left 2011  . LITHOTRIPSY  2005  . PACEMAKER INSERTION  2010    Social History   Tobacco Use  . Smoking status: Never Smoker  . Smokeless tobacco: Never Used  Substance Use Topics  . Alcohol use: No    Alcohol/week: 0.0 standard drinks  .  Drug use: No     Medication list has been reviewed and updated.  Current Meds  Medication Sig  . aspirin 81 MG  chewable tablet Chew 1 tablet by mouth daily.  Marland Kitchen glimepiride (AMARYL) 2 MG tablet TAKE 1 TABLET BY MOUTH TWICE DAILY WITH FOOD  . glucose blood (ONE TOUCH ULTRA TEST) test strip 1 each by Other route 2 (two) times daily.  . metoprolol succinate (TOPROL-XL) 25 MG 24 hr tablet TAKE 1 TABLET BY MOUTH EVERY DAY  . Multiple Vitamins-Minerals (MULTIVITAL) tablet Take 1 tablet by mouth daily.  Marland Kitchen olmesartan-hydrochlorothiazide (BENICAR HCT) 20-12.5 MG tablet Take 1 tablet by mouth daily.  Marland Kitchen omega-3 acid ethyl esters (LOVAZA) 1 g capsule TAKE 4 CAPSULES BY MOUTH EVERY DAY  . simvastatin (ZOCOR) 80 MG tablet TAKE 1 TABLET(80 MG) BY MOUTH DAILY  . SitaGLIPtin-MetFORMIN HCl (JANUMET XR) 50-1000 MG TB24 Take 2 tablets by mouth daily.    PHQ 2/9 Scores 05/03/2019 12/01/2018 03/23/2018 04/09/2017  PHQ - 2 Score 0 0 0 0    BP Readings from Last 3 Encounters:  05/03/19 116/70  12/01/18 122/78  07/26/18 122/78    Physical Exam Vitals signs and nursing note reviewed.  Constitutional:      General: She is not in acute distress.    Appearance: She is well-developed.  HENT:     Head: Normocephalic and atraumatic.     Right Ear: Tympanic membrane and ear canal normal.     Left Ear: Tympanic membrane and ear canal normal.     Nose:     Right Sinus: No maxillary sinus tenderness.     Left Sinus: No maxillary sinus tenderness.  Eyes:     General: No scleral icterus.       Right eye: No discharge.        Left eye: No discharge.     Conjunctiva/sclera: Conjunctivae normal.  Neck:     Musculoskeletal: Normal range of motion. No erythema.     Thyroid: No thyromegaly.     Vascular: No carotid bruit.  Cardiovascular:     Rate and Rhythm: Normal rate and regular rhythm.     Pulses: Normal pulses.     Heart sounds: Normal heart sounds.  Pulmonary:     Effort: Pulmonary effort is normal. No respiratory distress.     Breath sounds: No wheezing.  Chest:     Breasts:        Right: No mass, nipple discharge,  skin change or tenderness.        Left: No mass, nipple discharge, skin change or tenderness.  Abdominal:     General: Bowel sounds are normal.     Palpations: Abdomen is soft.     Tenderness: There is no abdominal tenderness.  Musculoskeletal: Normal range of motion.     Right lower leg: No edema.     Left lower leg: No edema.  Lymphadenopathy:     Cervical: No cervical adenopathy.  Skin:    General: Skin is warm and dry.     Capillary Refill: Capillary refill takes less than 2 seconds.     Findings: No rash.  Neurological:     Mental Status: She is alert and oriented to person, place, and time.     Cranial Nerves: No cranial nerve deficit.     Sensory: No sensory deficit.     Deep Tendon Reflexes: Reflexes are normal and symmetric.  Psychiatric:        Speech: Speech normal.  Behavior: Behavior normal.        Thought Content: Thought content normal.     Wt Readings from Last 3 Encounters:  05/03/19 203 lb (92.1 kg)  12/01/18 195 lb (88.5 kg)  07/26/18 198 lb (89.8 kg)    BP 116/70   Pulse 60   Temp (!) 97.1 F (36.2 C) (Temporal)   Ht 5\' 8"  (1.727 m)   Wt 203 lb (92.1 kg)   SpO2 97%   BMI 30.87 kg/m   Assessment and Plan: 1. Essential (primary) hypertension Controlled - CBC with Differential/Platelet - POCT urinalysis dipstick  2. Type 2 diabetes mellitus with other circulatory complications (HCC) Hopefully improving Pt urged to begin regular exercise - Comprehensive metabolic panel - Hemoglobin A1c - TSH  3. Hyperlipidemia associated with type 2 diabetes mellitus ([place]) On statin therapy - Lipid panel  4. Encounter for screening mammogram for breast cancer To be scheduled at [facility] by patient - MM 3D SCREEN BREAST BILATERAL; Future  5. Encounter for osteoporosis screening in asymptomatic postmenopausal patient - DG Bone Density; Future   Partially dictated using Editor, commissioning. Any errors are unintentional.  Halina Maidens, MD [facility]  05/03/2019

## 2019-05-03 NOTE — Patient Instructions (Signed)
Health Maintenance After Age 74 After age 74, you are at a higher risk for certain long-term diseases and infections as well as injuries from falls. Falls are a major cause of broken bones and head injuries in people who are older than age 74. Getting regular preventive care can help to keep you healthy and well. Preventive care includes getting regular testing and making lifestyle changes as recommended by your health care provider. Talk with your health care provider about:  Which screenings and tests you should have. A screening is a test that checks for a disease when you have no symptoms.  A diet and exercise plan that is right for you. What should I know about screenings and tests to prevent falls? Screening and testing are the best ways to find a health problem early. Early diagnosis and treatment give you the best chance of managing medical conditions that are common after age 74. Certain conditions and lifestyle choices may make you more likely to have a fall. Your health care provider may recommend:  Regular vision checks. Poor vision and conditions such as cataracts can make you more likely to have a fall. If you wear glasses, make sure to get your prescription updated if your vision changes.  Medicine review. Work with your health care provider to regularly review all of the medicines you are taking, including over-the-counter medicines. Ask your health care provider about any side effects that may make you more likely to have a fall. Tell your health care provider if any medicines that you take make you feel dizzy or sleepy.  Osteoporosis screening. Osteoporosis is a condition that causes the bones to get weaker. This can make the bones weak and cause them to break more easily.  Blood pressure screening. Blood pressure changes and medicines to control blood pressure can make you feel dizzy.  Strength and balance checks. Your health care provider may recommend certain tests to check your  strength and balance while standing, walking, or changing positions.  Foot health exam. Foot pain and numbness, as well as not wearing proper footwear, can make you more likely to have a fall.  Depression screening. You may be more likely to have a fall if you have a fear of falling, feel emotionally low, or feel unable to do activities that you used to do.  Alcohol use screening. Using too much alcohol can affect your balance and may make you more likely to have a fall. What actions can I take to lower my risk of falls? General instructions  Talk with your health care provider about your risks for falling. Tell your health care provider if: ? You fall. Be sure to tell your health care provider about all falls, even ones that seem minor. ? You feel dizzy, sleepy, or off-balance.  Take over-the-counter and prescription medicines only as told by your health care provider. These include any supplements.  Eat a healthy diet and maintain a healthy weight. A healthy diet includes low-fat dairy products, low-fat (lean) meats, and fiber from whole grains, beans, and lots of fruits and vegetables. Home safety  Remove any tripping hazards, such as rugs, cords, and clutter.  Install safety equipment such as grab bars in bathrooms and safety rails on stairs.  Keep rooms and walkways well-lit. Activity   Follow a regular exercise program to stay fit. This will help you maintain your balance. Ask your health care provider what types of exercise are appropriate for you.  If you need a cane or   walker, use it as recommended by your health care provider.  Wear supportive shoes that have nonskid soles. Lifestyle  Do not drink alcohol if your health care provider tells you not to drink.  If you drink alcohol, limit how much you have: ? 0-1 drink a day for women. ? 0-2 drinks a day for men.  Be aware of how much alcohol is in your drink. In the [place] one drink equals one typical bottle of beer (12  oz), one-half glass of wine (5 oz), or one shot of hard liquor (1 oz).  Do not use any products that contain nicotine or tobacco, such as cigarettes and e-cigarettes. If you need help quitting, ask your health care provider. Summary  Having a healthy lifestyle and getting preventive care can help to protect your health and wellness after age 74.  Screening and testing are the best way to find a health problem early and help you avoid having a fall. Early diagnosis and treatment give you the best chance for managing medical conditions that are more common for people who are older than age 74.  Falls are a major cause of broken bones and head injuries in people who are older than age 74. Take precautions to prevent a fall at home.  Work with your health care provider to learn what changes you can make to improve your health and wellness and to prevent falls. This information is not intended to replace advice given to you by your health care provider. Make sure you discuss any questions you have with your health care provider. Document Released: 08/26/2017 Document Revised: 02/03/2019 Document Reviewed: 08/26/2017 Elsevier Patient Education  2020 Elsevier Inc.  

## 2019-05-04 LAB — COMPREHENSIVE METABOLIC PANEL
ALT: 28 IU/L (ref 0–32)
AST: 28 IU/L (ref 0–40)
Albumin/Globulin Ratio: 2.3 — ABNORMAL HIGH (ref 1.2–2.2)
Albumin: 4.5 g/dL (ref 3.7–4.7)
Alkaline Phosphatase: 44 IU/L (ref 39–117)
BUN/Creatinine Ratio: 19 (ref 12–28)
BUN: 20 mg/dL (ref 8–27)
Bilirubin Total: 0.6 mg/dL (ref 0.0–1.2)
CO2: 18 mmol/L — ABNORMAL LOW (ref 20–29)
Calcium: 9.8 mg/dL (ref 8.7–10.3)
Chloride: 102 mmol/L (ref 96–106)
Creatinine, Ser: 1.08 mg/dL — ABNORMAL HIGH (ref 0.57–1.00)
GFR calc Af Amer: 59 mL/min/{1.73_m2} — ABNORMAL LOW (ref >59)
GFR calc non Af Amer: 51 mL/min/{1.73_m2} — ABNORMAL LOW (ref >59)
Globulin, Total: 2 g/dL (ref 1.5–4.5)
Glucose: 264 mg/dL — ABNORMAL HIGH (ref 65–99)
Potassium: 4.6 mmol/L (ref 3.5–5.2)
Sodium: 138 mmol/L (ref 134–144)
Total Protein: 6.5 g/dL (ref 6.0–8.5)

## 2019-05-04 LAB — LIPID PANEL
Chol/HDL Ratio: 2.5 ratio (ref 0.0–4.4)
Cholesterol, Total: 121 mg/dL (ref 100–199)
HDL: 48 mg/dL (ref >39)
LDL Calculated: 35 mg/dL (ref 0–99)
Triglycerides: 192 mg/dL — ABNORMAL HIGH (ref 0–149)
VLDL Cholesterol Cal: 38 mg/dL (ref 5–40)

## 2019-05-04 LAB — CBC WITH DIFFERENTIAL/PLATELET
Basophils Absolute: 0.1 10*3/uL (ref 0.0–0.2)
Basos: 1 %
EOS (ABSOLUTE): 0.1 10*3/uL (ref 0.0–0.4)
Eos: 1 %
Hematocrit: 38.9 % (ref 34.0–46.6)
Hemoglobin: 13.3 g/dL (ref 11.1–15.9)
Immature Grans (Abs): 0 10*3/uL (ref 0.0–0.1)
Immature Granulocytes: 0 %
Lymphocytes Absolute: 1.6 10*3/uL (ref 0.7–3.1)
Lymphs: 24 %
MCH: 30.3 pg (ref 26.6–33.0)
MCHC: 34.2 g/dL (ref 31.5–35.7)
MCV: 89 fL (ref 79–97)
Monocytes Absolute: 0.4 10*3/uL (ref 0.1–0.9)
Monocytes: 6 %
Neutrophils Absolute: 4.6 10*3/uL (ref 1.4–7.0)
Neutrophils: 68 %
Platelets: 157 10*3/uL (ref 150–450)
RBC: 4.39 x10E6/uL (ref 3.77–5.28)
RDW: 12.3 % (ref 11.7–15.4)
WBC: 6.6 10*3/uL (ref 3.4–10.8)

## 2019-05-04 LAB — TSH: TSH: 3.29 u[IU]/mL (ref 0.450–4.500)

## 2019-05-04 LAB — HEMOGLOBIN A1C
Est. average glucose Bld gHb Est-mCnc: 171 mg/dL
Hgb A1c MFr Bld: 7.6 % — ABNORMAL HIGH (ref 4.8–5.6)

## 2019-06-10 DIAGNOSIS — Z45018 Encounter for adjustment and management of other part of cardiac pacemaker: Secondary | ICD-10-CM | POA: Diagnosis not present

## 2019-06-10 DIAGNOSIS — I495 Sick sinus syndrome: Secondary | ICD-10-CM | POA: Diagnosis not present

## 2019-06-10 DIAGNOSIS — Z950 Presence of cardiac pacemaker: Secondary | ICD-10-CM | POA: Diagnosis not present

## 2019-06-17 DIAGNOSIS — I471 Supraventricular tachycardia: Secondary | ICD-10-CM | POA: Diagnosis not present

## 2019-06-17 DIAGNOSIS — R001 Bradycardia, unspecified: Secondary | ICD-10-CM | POA: Diagnosis not present

## 2019-06-17 DIAGNOSIS — Z950 Presence of cardiac pacemaker: Secondary | ICD-10-CM | POA: Diagnosis not present

## 2019-06-17 DIAGNOSIS — I10 Essential (primary) hypertension: Secondary | ICD-10-CM | POA: Diagnosis not present

## 2019-07-14 DIAGNOSIS — I455 Other specified heart block: Secondary | ICD-10-CM | POA: Diagnosis not present

## 2019-07-14 DIAGNOSIS — R001 Bradycardia, unspecified: Secondary | ICD-10-CM | POA: Diagnosis not present

## 2019-07-14 DIAGNOSIS — Z45010 Encounter for checking and testing of cardiac pacemaker pulse generator [battery]: Secondary | ICD-10-CM | POA: Diagnosis not present

## 2019-08-24 ENCOUNTER — Ambulatory Visit: Payer: Medicare Other

## 2019-09-08 ENCOUNTER — Encounter: Payer: Self-pay | Admitting: Internal Medicine

## 2019-09-08 ENCOUNTER — Other Ambulatory Visit: Payer: Self-pay

## 2019-09-08 ENCOUNTER — Ambulatory Visit (INDEPENDENT_AMBULATORY_CARE_PROVIDER_SITE_OTHER): Payer: Medicare Other | Admitting: Internal Medicine

## 2019-09-08 VITALS — BP 136/82 | HR 64 | Ht 68.0 in | Wt 202.0 lb

## 2019-09-08 DIAGNOSIS — Z23 Encounter for immunization: Secondary | ICD-10-CM

## 2019-09-08 DIAGNOSIS — I471 Supraventricular tachycardia: Secondary | ICD-10-CM

## 2019-09-08 DIAGNOSIS — I10 Essential (primary) hypertension: Secondary | ICD-10-CM

## 2019-09-08 DIAGNOSIS — E785 Hyperlipidemia, unspecified: Secondary | ICD-10-CM | POA: Diagnosis not present

## 2019-09-08 DIAGNOSIS — E1169 Type 2 diabetes mellitus with other specified complication: Secondary | ICD-10-CM

## 2019-09-08 DIAGNOSIS — E118 Type 2 diabetes mellitus with unspecified complications: Secondary | ICD-10-CM

## 2019-09-08 MED ORDER — METFORMIN HCL ER (OSM) 1000 MG PO TB24: 1000.0000 mg | ORAL_TABLET | Freq: Two times a day (BID) | ORAL | 0 refills | Status: DC

## 2019-09-08 MED ORDER — SHINGRIX 50 MCG/0.5ML IM SUSR: 0.5000 mL | Freq: Once | INTRAMUSCULAR | 1 refills | Status: AC

## 2019-09-08 NOTE — Progress Notes (Signed)
Date:  09/08/2019   Name:  Kimberly Salazar   DOB:  01-17-1946   MRN:  [id number]   Chief Complaint: Diabetes (High dose flu shot)  Diabetes She presents for her follow-up diabetic visit. She has type 2 diabetes mellitus. Her disease course has been stable. Pertinent negatives for hypoglycemia include no headaches, nervousness/anxiousness or tremors. Pertinent negatives for diabetes include no chest pain, no fatigue, no polydipsia and no polyuria. Current diabetic treatment includes oral agent (triple therapy) (janumet and glipizide). She is compliant with treatment all of the time. An ACE inhibitor/angiotensin II receptor blocker is being taken. Eye exam is not current (Overdue for DM eye exam).  Hypertension This is a chronic problem. The problem is controlled. Pertinent negatives include no chest pain, headaches, palpitations or shortness of breath. Past treatments include angiotensin blockers, diuretics and beta blockers. The current treatment provides significant improvement.  Hyperlipidemia The problem is controlled. Pertinent negatives include no chest pain or shortness of breath. Current antihyperlipidemic treatment includes statins. The current treatment provides significant improvement of lipids.    Lab Results  Component Value Date   CREATININE 1.08 (H) 05/03/2019   BUN 20 05/03/2019   NA 138 05/03/2019   K 4.6 05/03/2019   CL 102 05/03/2019   CO2 18 (L) 05/03/2019   Lab Results  Component Value Date   CHOL 121 05/03/2019   HDL 48 05/03/2019   LDLCALC 35 05/03/2019   TRIG 192 (H) 05/03/2019   CHOLHDL 2.5 05/03/2019   Lab Results  Component Value Date   TSH 3.290 05/03/2019   Lab Results  Component Value Date   HGBA1C 7.6 (H) 05/03/2019     Review of Systems  Constitutional: Negative for appetite change, fatigue, fever and unexpected weight change.  HENT: Negative for tinnitus and trouble swallowing.   Eyes: Negative for visual disturbance.  Respiratory:  Negative for cough, chest tightness and shortness of breath.   Cardiovascular: Negative for chest pain, palpitations and leg swelling.  Gastrointestinal: Negative for abdominal pain.  Endocrine: Negative for polydipsia and polyuria.  Genitourinary: Negative for dysuria and hematuria.  Musculoskeletal: Negative for arthralgias.  Skin: Negative for color change and wound.  Neurological: Negative for tremors, numbness and headaches.  Hematological: Negative for adenopathy.  Psychiatric/Behavioral: Negative for dysphoric mood and sleep disturbance. The patient is not nervous/anxious.     Patient Active Problem List   Diagnosis Date Noted  . Ovarian failure 04/09/2017  . Basal cell carcinoma, leg 07/15/2016  . Abnormal finding on thallium stress test 05/17/2015  . Supraventricular tachycardia ([place]) 05/17/2015  . Hyperlipidemia associated with type 2 diabetes mellitus ([place]) 04/12/2015  . Essential (primary) hypertension 04/12/2015  . History of uterine cancer 04/12/2015  . Type II diabetes mellitus with complication ([place]) [id number]  . Cardiac pacemaker in situ 10/11/2009    No Known Allergies  Past Surgical History:  Procedure Laterality Date  . ABDOMINAL HYSTERECTOMY  1994   Uterine CA  . APPENDECTOMY    . CATARACT EXTRACTION Left 2011  . LITHOTRIPSY  2005  . PACEMAKER INSERTION  2010    Social History   Tobacco Use  . Smoking status: Never Smoker  . Smokeless tobacco: Never Used  Substance Use Topics  . Alcohol use: No    Alcohol/week: 0.0 standard drinks  . Drug use: No     Medication list has been reviewed and updated.  Current Meds  Medication Sig  . aspirin 81 MG chewable tablet Chew 1 tablet by mouth daily.  Marland Kitchen  cholecalciferol (VITAMIN D3) 25 MCG (1000 UT) tablet Take 1,000 Units by mouth daily.  Marland Kitchen glimepiride (AMARYL) 2 MG tablet TAKE 1 TABLET BY MOUTH TWICE DAILY WITH FOOD  . glucose blood (ONE TOUCH ULTRA TEST) test strip 1 each by Other route 2 (two) times  daily.  . metoprolol succinate (TOPROL-XL) 25 MG 24 hr tablet TAKE 1 TABLET BY MOUTH EVERY DAY  . Multiple Vitamins-Minerals (MULTIVITAL) tablet Take 1 tablet by mouth daily.  Marland Kitchen olmesartan-hydrochlorothiazide (BENICAR HCT) 20-12.5 MG tablet Take 1 tablet by mouth daily.  Marland Kitchen omega-3 acid ethyl esters (LOVAZA) 1 g capsule TAKE 4 CAPSULES BY MOUTH EVERY DAY  . simvastatin (ZOCOR) 80 MG tablet TAKE 1 TABLET(80 MG) BY MOUTH DAILY  . SitaGLIPtin-MetFORMIN HCl (JANUMET XR) 50-1000 MG TB24 Take 2 tablets by mouth daily.    PHQ 2/9 Scores 09/08/2019 05/03/2019 12/01/2018 03/23/2018  PHQ - 2 Score 0 0 0 0  PHQ- 9 Score 0 - - -    BP Readings from Last 3 Encounters:  09/08/19 136/82  05/03/19 116/70  12/01/18 122/78    Physical Exam Vitals signs and nursing note reviewed.  Constitutional:      General: She is not in acute distress.    Appearance: She is well-developed.  HENT:     Head: Normocephalic and atraumatic.  Neck:     Musculoskeletal: Normal range of motion.  Cardiovascular:     Rate and Rhythm: Normal rate and regular rhythm.  Pulmonary:     Effort: Pulmonary effort is normal. No respiratory distress.  Musculoskeletal: Normal range of motion.  Skin:    General: Skin is warm and dry.     Findings: No rash.  Neurological:     Mental Status: She is alert and oriented to person, place, and time.  Psychiatric:        Behavior: Behavior normal.        Thought Content: Thought content normal.     Wt Readings from Last 3 Encounters:  09/08/19 202 lb (91.6 kg)  05/03/19 203 lb (92.1 kg)  12/01/18 195 lb (88.5 kg)    BP 136/82   Pulse 64   Ht 5\' 8"  (1.727 m)   Wt 202 lb (91.6 kg)   SpO2 97%   BMI 30.71 kg/m   Assessment and Plan: 1. Type II diabetes mellitus with complication (HCC) Clinically stable by exam and report without s/s of hypoglycemia. DM complicated by HTN. Tolerating medications Janumet 50-1000 bid well without side effects or other concerns but is in the  donut hole. Will give samples of Januvia 100 mg daily + Rx metformin 1000 mg bid - Basic metabolic panel - Hemoglobin A1c  2. Essential (primary) hypertension Clinically stable exam with well controlled BP.   Tolerating medications, Benicar hct and metoprolol, without side effects at this time. Pt to continue current regimen and low sodium diet; benefits of regular exercise as able discussed.  3. Hyperlipidemia associated with type 2 diabetes mellitus ([place]) Tolerating statin medication without side effects at this time LDL is at goal of < 70 on current dose Continue same therapy without change at this time.  4. Supraventricular tachycardia ([place]) Followed by Cardiology Recently had pacemaker replaced and doing well  5. Need for shingles vaccine Pt will pursue at her local pharmacy - Zoster Vaccine Adjuvanted [facility]) injection; Inject 0.5 mLs into the muscle once for 1 dose.  Dispense: 0.5 mL; Refill: 1  6. Need for immunization against influenza - Flu Vaccine QUAD High Dose(Fluad)  Partially dictated using Editor, commissioning. Any errors are unintentional.  Halina Maidens, MD [facility]  09/08/2019

## 2019-09-09 LAB — HEMOGLOBIN A1C
Est. average glucose Bld gHb Est-mCnc: 194 mg/dL
Hgb A1c MFr Bld: 8.4 % — ABNORMAL HIGH (ref 4.8–5.6)

## 2019-09-09 LAB — BASIC METABOLIC PANEL
BUN/Creatinine Ratio: 17 (ref 12–28)
BUN: 17 mg/dL (ref 8–27)
CO2: 21 mmol/L (ref 20–29)
Calcium: 10.5 mg/dL — ABNORMAL HIGH (ref 8.7–10.3)
Chloride: 99 mmol/L (ref 96–106)
Creatinine, Ser: 1.01 mg/dL — ABNORMAL HIGH (ref 0.57–1.00)
GFR calc Af Amer: 63 mL/min/{1.73_m2} (ref >59)
GFR calc non Af Amer: 55 mL/min/{1.73_m2} — ABNORMAL LOW (ref >59)
Glucose: 282 mg/dL — ABNORMAL HIGH (ref 65–99)
Potassium: 5.1 mmol/L (ref 3.5–5.2)
Sodium: 139 mmol/L (ref 134–144)

## 2019-09-19 ENCOUNTER — Other Ambulatory Visit: Payer: Self-pay

## 2019-09-19 MED ORDER — METFORMIN HCL 1000 MG PO TABS: 1000.0000 mg | ORAL_TABLET | Freq: Two times a day (BID) | ORAL | 1 refills | Status: DC

## 2019-09-28 ENCOUNTER — Ambulatory Visit (INDEPENDENT_AMBULATORY_CARE_PROVIDER_SITE_OTHER): Payer: Medicare Other

## 2019-09-28 VITALS — Ht 68.0 in | Wt 199.0 lb

## 2019-09-28 DIAGNOSIS — Z0000 Encounter for general adult medical examination without abnormal findings: Secondary | ICD-10-CM

## 2019-09-28 DIAGNOSIS — Z13820 Encounter for screening for osteoporosis: Secondary | ICD-10-CM | POA: Diagnosis not present

## 2019-09-28 DIAGNOSIS — Z780 Asymptomatic menopausal state: Secondary | ICD-10-CM | POA: Diagnosis not present

## 2019-09-28 NOTE — Progress Notes (Signed)
Subjective:   Kimberly Salazar is a 73 y.o. female who presents for an Initial Medicare Annual Wellness Visit.  Virtual Visit via Telephone Note  I connected with Kimberly Salazar on 09/28/19 at  3:20 PM EST by telephone and verified that I am speaking with the correct person using two identifiers.  Medicare Annual Wellness visit completed telephonically due to Covid-19 pandemic.   Location: Patient: home Provider: office    I discussed the limitations, risks, security and privacy concerns of performing an evaluation and management service by telephone and the availability of in person appointments. The patient expressed understanding and agreed to proceed.  Some vital signs may be absent or patient reported.   Clemetine Marker, LPN    Review of Systems      Cardiac Risk Factors include: advanced age (>34men, >72 women);diabetes mellitus;dyslipidemia;hypertension;obesity (BMI >30kg/m2)     Objective:    Today's Vitals   09/28/19 1525  Weight: 199 lb (90.3 kg)  Height: 5\' 8"  (1.727 m)   Body mass index is 30.26 kg/m.  Advanced Directives 09/28/2019 03/23/2018 11/19/2015  Does Patient Have a Medical Advance Directive? No No No  Would patient like information on creating a medical advance directive? Yes (MAU/Ambulatory/Procedural Areas - Information given) No - Patient declined No - patient declined information    Current Medications (verified) Outpatient Encounter Medications as of 09/28/2019  Medication Sig  . aspirin 81 MG chewable tablet Chew 1 tablet by mouth daily.  . cholecalciferol (VITAMIN D3) 25 MCG (1000 UT) tablet Take 1,000 Units by mouth daily.  Marland Kitchen glimepiride (AMARYL) 2 MG tablet TAKE 1 TABLET BY MOUTH TWICE DAILY WITH FOOD  . metFORMIN (GLUCOPHAGE) 1000 MG tablet Take 1 tablet (1,000 mg total) by mouth 2 (two) times daily with a meal.  . metoprolol succinate (TOPROL-XL) 25 MG 24 hr tablet TAKE 1 TABLET BY MOUTH EVERY DAY  . Multiple Vitamins-Minerals (MULTIVITAL)  tablet Take 1 tablet by mouth daily.  Marland Kitchen olmesartan-hydrochlorothiazide (BENICAR HCT) 20-12.5 MG tablet Take 1 tablet by mouth daily.  Marland Kitchen omega-3 acid ethyl esters (LOVAZA) 1 g capsule TAKE 4 CAPSULES BY MOUTH EVERY DAY  . simvastatin (ZOCOR) 80 MG tablet TAKE 1 TABLET(80 MG) BY MOUTH DAILY  . sitaGLIPtin (JANUVIA) 100 MG tablet Take 100 mg by mouth daily. Patient given samples, previously taking janumet  . glucose blood (ONE TOUCH ULTRA TEST) test strip 1 each by Other route 2 (two) times daily. (Patient not taking: Reported on 09/28/2019)  . [DISCONTINUED] SitaGLIPtin-MetFORMIN HCl (JANUMET XR) 50-1000 MG TB24 Take 2 tablets by mouth daily.   No facility-administered encounter medications on file as of 09/28/2019.     Allergies (verified) Patient has no known allergies.   History: Past Medical History:  Diagnosis Date  . Diabetes mellitus without complication ([place])   . Hyperlipidemia   . Hypertension    Past Surgical History:  Procedure Laterality Date  . ABDOMINAL HYSTERECTOMY  1994   Uterine CA  . APPENDECTOMY    . CATARACT EXTRACTION Left 2011  . LITHOTRIPSY  2005  . PACEMAKER INSERTION  2010   Family History  Problem Relation Age of Onset  . Diabetes Mother   . Heart failure Father    Social History   Socioeconomic History  . Marital status: Widowed    Spouse name: Not on file  . Number of children: 2  . Years of education: Not on file  . Highest education level: Some college, no degree  Occupational History  . Not on  file  Social Needs  . Financial resource strain: Somewhat hard  . Food insecurity    Worry: Never true    Inability: Never true  . Transportation needs    Medical: No    Non-medical: No  Tobacco Use  . Smoking status: Never Smoker  . Smokeless tobacco: Never Used  Substance and Sexual Activity  . Alcohol use: No    Alcohol/week: 0.0 standard drinks  . Drug use: No  . Sexual activity: Not on file  Lifestyle  . Physical activity    Days per  week: 2 days    Minutes per session: 20 min  . Stress: Not on file  Relationships  . Social Herbalist on phone: Patient refused    Gets together: Patient refused    Attends religious service: Patient refused    Active member of club or organization: Patient refused    Attends meetings of clubs or organizations: Patient refused    Relationship status: Widowed  Other Topics Concern  . Not on file  Social History Narrative  . Not on file    Tobacco Counseling Counseling given: Not Answered   Clinical Intake:  Pre-visit preparation completed: Yes  Pain : No/denies pain     BMI - recorded: 30.26 Nutritional Status: BMI > 30  Obese Nutritional Risks: None Diabetes: Yes CBG done?: No Did pt. bring in CBG monitor from home?: No   Nutrition Risk Assessment:  Has the patient had any N/V/D within the last 2 months?  Yes  - change in metformin Does the patient have any non-healing wounds?  No  Has the patient had any unintentional weight loss or weight gain?  No   Diabetes:  Is the patient diabetic?  Yes  If diabetic, was a CBG obtained today?  No  Did the patient bring in their glucometer from home?  No  How often do you monitor your CBG's? Pt does not actively check blood sugar.   Financial Strains and Diabetes Management:  Are you having any financial strains with the device, your supplies or your medication? Yes . - pt had to switch from janumet due to being in donut hole Does the patient want to be seen by Chronic Care Management for management of their diabetes?  No  Would the patient like to be referred to a Nutritionist or for Diabetic Management?  No   Diabetic Exams:  Diabetic Eye Exam: Completed 04/26/18. Overdue for diabetic eye exam. Pt has been advised about the importance in completing this exam. Pt postponed appt this year due to Covid.   Diabetic Foot Exam: Completed 05/03/19.   How often do you need to have someone help you when you read  instructions, pamphlets, or other written materials from your doctor or pharmacy?: 1 - Never  Interpreter Needed?: No  Information entered by :: Clemetine Marker LPN   Activities of Daily Living In your present state of health, do you have any difficulty performing the following activities: 09/28/2019 05/03/2019  Hearing? N N  Comment declines hearing aids -  Vision? N N  Comment - reading glasses  Difficulty concentrating or making decisions? N N  Walking or climbing stairs? N N  Dressing or bathing? N N  Doing errands, shopping? N N  Preparing Food and eating ? N -  Using the Toilet? N -  In the past six months, have you accidently leaked urine? N -  Do you have problems with loss of bowel control? N -  Managing your Medications? N -  Managing your Finances? N -  Housekeeping or managing your Housekeeping? N -  Some recent data might be hidden     Immunizations and Health Maintenance Immunization History  Administered Date(s) Administered  . Fluad Quad(high Dose 65+) 09/08/2019  . Influenza, High Dose Seasonal PF 09/30/2018  . Influenza,inj,Quad PF,6+ Mos 11/19/2015, 07/15/2016  . Influenza-Unspecified 06/27/2017  . Pneumococcal Conjugate-13 09/15/2014  . Pneumococcal-Unspecified 06/02/2011  . Tdap 06/02/2011  . Zoster 10/27/2006   Health Maintenance Due  Topic Date Due  . DEXA SCAN  05/17/2010  . MAMMOGRAM  04/07/2019  . OPHTHALMOLOGY EXAM  04/27/2019    Patient Care Team: Glean Hess, MD as PCP - General (Internal Medicine) Luana Shu, DO as Referring Physician (Cardiology) Edwyna Ready, MD as Referring Physician (Cardiology) Magda Kiel Erlene Senters, MD as Referring Physician (Dermatology) [facility])  Indicate any recent Medical Services you may have received from other than [facility] providers in the past year (date may be approximate).     Assessment:   This is a routine wellness examination for [place].  Hearing/Vision screen   Hearing Screening   125Hz  250Hz  500Hz  1000Hz  2000Hz  3000Hz  4000Hz  6000Hz  8000Hz   Right ear:           Left ear:           Comments: Pt denies hearing difficulty  Vision Screening Comments: [facility] screenings [facility] in [place] issues and exercise activities discussed: Current Exercise Habits: Home exercise routine, Type of exercise: walking, Time (Minutes): 20, Frequency (Times/Week): 2, Weekly Exercise (Minutes/Week): 40, Intensity: Mild, Exercise limited by: None identified  Goals    . Increase physical activity     Recommend increasing physical activity to at least 3 days per week      Depression Screen PHQ 2/9 Scores 09/28/2019 09/08/2019 05/03/2019 12/01/2018 03/23/2018 04/09/2017 11/19/2015  PHQ - 2 Score 0 0 0 0 0 0 0  PHQ- 9 Score - 0 - - - - -    Fall Risk Fall Risk  09/28/2019 05/03/2019 12/01/2018 03/23/2018 04/09/2017  Falls in the past year? 0 0 0 No No  Number falls in past yr: 0 0 0 - -  Injury with Fall? 0 0 0 - -  Follow up Falls prevention discussed Falls evaluation completed Falls evaluation completed - -    FALL RISK PREVENTION PERTAINING TO THE HOME:  Any stairs in or around the home? Yes  If so, do they handrails? No   - steps on porch outside Home free of loose throw rugs in walkways, pet beds, electrical cords, etc? Yes  Adequate lighting in your home to reduce risk of falls? Yes   ASSISTIVE DEVICES UTILIZED TO PREVENT FALLS:  Life alert? No  Use of a cane, walker or w/c? No  Grab bars in the bathroom? No  Shower chair or bench in shower? No  Elevated toilet seat or a handicapped toilet? No   DME ORDERS:  DME order needed?  No   TIMED UP AND GO:  Was the test performed? No . Telephonic visit.   Education: Fall risk prevention has been discussed.  Intervention(s) required? No   Cognitive Function:     6CIT Screen 09/28/2019  What Year? 0 points  What month? 0 points  What time? 0 points  Count back from 20 0 points  Months  in reverse 0 points  Repeat phrase 0 points  Total Score 0  Screening Tests Health Maintenance  Topic Date Due  . DEXA SCAN  05/17/2010  . MAMMOGRAM  04/07/2019  . OPHTHALMOLOGY EXAM  04/27/2019  . HEMOGLOBIN A1C  03/07/2020  . FOOT EXAM  05/02/2020  . TETANUS/TDAP  06/01/2021  . COLONOSCOPY  05/25/2028  . INFLUENZA VACCINE  Completed  . Hepatitis C Screening  Completed  . PNA vac Low Risk Adult  Completed    Qualifies for Shingles Vaccine? Yes  Zostavax completed in 2008. Due for Shingrix. Education has been provided regarding the importance of this vaccine. Pt has been advised to call insurance company to determine out of pocket expense. Advised may also receive vaccine at local pharmacy or Health Dept. Verbalized acceptance and understanding.  Tdap: Up to date  Flu Vaccine: Up to date  Pneumococcal Vaccine: Up to date    Cancer Screenings:  Colorectal Screening: Completed 05/25/18. Repeat every 10 years;  Mammogram: Completed 04/06/18. Repeat every year. Ordered 05/03/19. Pt provided with contact information and advised to call to schedule appt.   Bone Density: Not completed. Ordered 05/03/19. Pt provided with contact information and advised to call to schedule appt.   Lung Cancer Screening: (Low Dose CT Chest recommended if Age 41-80 years, 30 pack-year currently smoking OR have quit w/in 15years.) does not qualify.   Additional Screening:  Hepatitis C Screening: does qualify; Completed 11/19/15  Vision Screening: Recommended annual ophthalmology exams for early detection of glaucoma and other disorders of the eye. Is the patient up to date with their annual eye exam?  No  - postponed due to Covid Who is the provider or what is the name of the office in which the pt attends annual eye exams? [facility] in [place]: Recommended annual dental exams for proper oral hygiene  Community Resource Referral:  CRR required this visit?  No      Plan:     I have personally reviewed and addressed the Medicare Annual Wellness questionnaire and have noted the following in the patient's chart:  A. Medical and social history B. Use of alcohol, tobacco or illicit drugs  C. Current medications and supplements D. Functional ability and status E.  Nutritional status F.  Physical activity G. Advance directives H. List of other physicians I.  Hospitalizations, surgeries, and ER visits in previous 12 months J.  [place] such as hearing and vision if needed, cognitive and depression L. Referrals and appointments   In addition, I have reviewed and discussed with patient certain preventive protocols, quality metrics, and best practice recommendations. A written personalized care plan for preventive services as well as general preventive health recommendations were provided to patient.   Signed,  Clemetine Marker, LPN Nurse Health Advisor   Nurse Notes: pt doing well and appreciative of visit today.

## 2019-09-28 NOTE — Patient Instructions (Signed)
Kimberly Salazar , Thank you for taking time to come for your Medicare Wellness Visit. I appreciate your ongoing commitment to your health goals. Please review the following plan we discussed and let me know if I can assist you in the future.   Screening recommendations/referrals: Colonoscopy: done 05/25/18 Mammogram: done 04/06/18. Please call [facility] to scheduled your mammogram.  Bone Density: due. Please call [phone] to schedule your bone density screening.  Recommended yearly ophthalmology/optometry visit for glaucoma screening and checkup Recommended yearly dental visit for hygiene and checkup  Vaccinations: Influenza vaccine: done 09/08/19 Pneumococcal vaccine: done 09/15/14 Tdap vaccine: done 06/02/11 Shingles vaccine: Shingrix discussed. Please contact your pharmacy for coverage information.   Advanced directives: Advance directive discussed with you today. I have provided a copy for you to complete at home and have notarized. Once this is complete please bring a copy in to our office so we can scan it into your chart.  Conditions/risks identified: Recommend increasing physical activity to at least 3 days per week  Next appointment: Please follow up in one year for your Medicare Annual Wellness visit.     Preventive Care 74 Years and Older, Female Preventive care refers to lifestyle choices and visits with your health care provider that can promote health and wellness. What does preventive care include?  A yearly physical exam. This is also called an annual well check.  Dental exams once or twice a year.  Routine eye exams. Ask your health care provider how often you should have your eyes checked.  Personal lifestyle choices, including:  Daily care of your teeth and gums.  Regular physical activity.  Eating a healthy diet.  Avoiding tobacco and drug use.  Limiting alcohol use.  Practicing safe sex.  Taking low-dose aspirin every day.  Taking vitamin and mineral  supplements as recommended by your health care provider. What happens during an annual well check? The services and screenings done by your health care provider during your annual well check will depend on your age, overall health, lifestyle risk factors, and family history of disease. Counseling  Your health care provider may ask you questions about your:  Alcohol use.  Tobacco use.  Drug use.  Emotional well-being.  Home and relationship well-being.  Sexual activity.  Eating habits.  History of falls.  Memory and ability to understand (cognition).  Work and work Statistician.  Reproductive health. Screening  You may have the following tests or measurements:  Height, weight, and BMI.  Blood pressure.  Lipid and cholesterol levels. These may be checked every 5 years, or more frequently if you are over 77 years old.  Skin check.  Lung cancer screening. You may have this screening every year starting at age 49 if you have a 30-pack-year history of smoking and currently smoke or have quit within the past 15 years.  Fecal occult blood test (FOBT) of the stool. You may have this test every year starting at age 2.  Flexible sigmoidoscopy or colonoscopy. You may have a sigmoidoscopy every 5 years or a colonoscopy every 10 years starting at age 85.  Hepatitis C blood test.  Hepatitis B blood test.  Sexually transmitted disease (STD) testing.  Diabetes screening. This is done by checking your blood sugar (glucose) after you have not eaten for a while (fasting). You may have this done every 1-3 years.  Bone density scan. This is done to screen for osteoporosis. You may have this done starting at age 24.  Mammogram. This may be done  every 1-2 years. Talk to your health care provider about how often you should have regular mammograms. Talk with your health care provider about your test results, treatment options, and if necessary, the need for more tests. Vaccines  Your  health care provider may recommend certain vaccines, such as:  Influenza vaccine. This is recommended every year.  Tetanus, diphtheria, and acellular pertussis (Tdap, Td) vaccine. You may need a Td booster every 10 years.  Zoster vaccine. You may need this after age 62.  Pneumococcal 13-valent conjugate (PCV13) vaccine. One dose is recommended after age 35.  Pneumococcal polysaccharide (PPSV23) vaccine. One dose is recommended after age 72. Talk to your health care provider about which screenings and vaccines you need and how often you need them. This information is not intended to replace advice given to you by your health care provider. Make sure you discuss any questions you have with your health care provider. Document Released: 11/09/2015 Document Revised: 07/02/2016 Document Reviewed: 08/14/2015 Elsevier Interactive Patient Education  2017 [employer] Prevention in the Home Falls can cause injuries. They can happen to people of all ages. There are many things you can do to make your home safe and to help prevent falls. What can I do on the outside of my home?  Regularly fix the edges of walkways and driveways and fix any cracks.  Remove anything that might make you trip as you walk through a door, such as a raised step or threshold.  Trim any bushes or trees on the path to your home.  Use bright outdoor lighting.  Clear any walking paths of anything that might make someone trip, such as rocks or tools.  Regularly check to see if handrails are loose or broken. Make sure that both sides of any steps have handrails.  Any raised decks and porches should have guardrails on the edges.  Have any leaves, snow, or ice cleared regularly.  Use sand or salt on walking paths during winter.  Clean up any spills in your garage right away. This includes oil or grease spills. What can I do in the bathroom?  Use night lights.  Install grab bars by the toilet and in the tub and  shower. Do not use towel bars as grab bars.  Use non-skid mats or decals in the tub or shower.  If you need to sit down in the shower, use a plastic, non-slip stool.  Keep the floor dry. Clean up any water that spills on the floor as soon as it happens.  Remove soap buildup in the tub or shower regularly.  Attach bath mats securely with double-sided non-slip rug tape.  Do not have throw rugs and other things on the floor that can make you trip. What can I do in the bedroom?  Use night lights.  Make sure that you have a light by your bed that is easy to reach.  Do not use any sheets or blankets that are too big for your bed. They should not hang down onto the floor.  Have a firm chair that has side arms. You can use this for support while you get dressed.  Do not have throw rugs and other things on the floor that can make you trip. What can I do in the kitchen?  Clean up any spills right away.  Avoid walking on wet floors.  Keep items that you use a lot in easy-to-reach places.  If you need to reach something above you, use a  strong step stool that has a grab bar.  Keep electrical cords out of the way.  Do not use floor polish or wax that makes floors slippery. If you must use wax, use non-skid floor wax.  Do not have throw rugs and other things on the floor that can make you trip. What can I do with my stairs?  Do not leave any items on the stairs.  Make sure that there are handrails on both sides of the stairs and use them. Fix handrails that are broken or loose. Make sure that handrails are as long as the stairways.  Check any carpeting to make sure that it is firmly attached to the stairs. Fix any carpet that is loose or worn.  Avoid having throw rugs at the top or bottom of the stairs. If you do have throw rugs, attach them to the floor with carpet tape.  Make sure that you have a light switch at the top of the stairs and the bottom of the stairs. If you do not  have them, ask someone to add them for you. What else can I do to help prevent falls?  Wear shoes that:  Do not have high heels.  Have rubber bottoms.  Are comfortable and fit you well.  Are closed at the toe. Do not wear sandals.  If you use a stepladder:  Make sure that it is fully opened. Do not climb a closed stepladder.  Make sure that both sides of the stepladder are locked into place.  Ask someone to hold it for you, if possible.  Clearly mark and make sure that you can see:  Any grab bars or handrails.  First and last steps.  Where the edge of each step is.  Use tools that help you move around (mobility aids) if they are needed. These include:  Canes.  Walkers.  Scooters.  Crutches.  Turn on the lights when you go into a dark area. Replace any light bulbs as soon as they burn out.  Set up your furniture so you have a clear path. Avoid moving your furniture around.  If any of your floors are uneven, fix them.  If there are any pets around you, be aware of where they are.  Review your medicines with your doctor. Some medicines can make you feel dizzy. This can increase your chance of falling. Ask your doctor what other things that you can do to help prevent falls. This information is not intended to replace advice given to you by your health care provider. Make sure you discuss any questions you have with your health care provider. Document Released: 08/09/2009 Document Revised: 03/20/2016 Document Reviewed: 11/17/2014 Elsevier Interactive Patient Education  2017 [employer].

## 2019-10-08 ENCOUNTER — Other Ambulatory Visit: Payer: Self-pay | Admitting: Internal Medicine

## 2019-10-08 DIAGNOSIS — I10 Essential (primary) hypertension: Secondary | ICD-10-CM

## 2019-11-15 ENCOUNTER — Other Ambulatory Visit: Payer: Self-pay | Admitting: Internal Medicine

## 2019-12-22 DIAGNOSIS — Z23 Encounter for immunization: Secondary | ICD-10-CM | POA: Diagnosis not present

## 2019-12-26 ENCOUNTER — Other Ambulatory Visit: Payer: Self-pay | Admitting: Internal Medicine

## 2020-01-10 ENCOUNTER — Other Ambulatory Visit: Payer: Self-pay

## 2020-01-10 ENCOUNTER — Ambulatory Visit (INDEPENDENT_AMBULATORY_CARE_PROVIDER_SITE_OTHER): Payer: Medicare Other | Admitting: Internal Medicine

## 2020-01-10 ENCOUNTER — Other Ambulatory Visit: Payer: Self-pay | Admitting: Internal Medicine

## 2020-01-10 ENCOUNTER — Encounter: Payer: Self-pay | Admitting: Internal Medicine

## 2020-01-10 VITALS — BP 136/70 | HR 65 | Temp 96.9°F | Ht 68.0 in | Wt 200.0 lb

## 2020-01-10 DIAGNOSIS — E785 Hyperlipidemia, unspecified: Secondary | ICD-10-CM | POA: Diagnosis not present

## 2020-01-10 DIAGNOSIS — E1169 Type 2 diabetes mellitus with other specified complication: Secondary | ICD-10-CM

## 2020-01-10 DIAGNOSIS — E118 Type 2 diabetes mellitus with unspecified complications: Secondary | ICD-10-CM | POA: Diagnosis not present

## 2020-01-10 DIAGNOSIS — I10 Essential (primary) hypertension: Secondary | ICD-10-CM

## 2020-01-10 NOTE — Progress Notes (Signed)
Date:  01/10/2020   Name:  Kimberly Salazar   DOB:  Nov 19, 1945   MRN:  [id number]   Chief Complaint: Diabetes (4 month follow up.) and Hypertension  Diabetes She presents for her follow-up diabetic visit. She has type 2 diabetes mellitus. Her disease course has been stable. There are no hypoglycemic associated symptoms. Pertinent negatives for hypoglycemia include no headaches or tremors. Pertinent negatives for diabetes include no chest pain, no fatigue, no polydipsia and no polyuria. Current diabetic treatment includes oral agent (dual therapy) (janumet). She is compliant with treatment all of the time. Her weight is stable. She is following a generally healthy diet. There is no compliance with monitoring of blood glucose. An ACE inhibitor/angiotensin II receptor blocker is being taken. Eye exam is not current.  Hypertension This is a chronic problem. The problem is controlled. Pertinent negatives include no chest pain, headaches, palpitations or shortness of breath. Hypertensive end-organ damage includes CAD/MI (s/p pacemaker).    Lab Results  Component Value Date   CREATININE 1.01 (H) 09/08/2019   BUN 17 09/08/2019   NA 139 09/08/2019   K 5.1 09/08/2019   CL 99 09/08/2019   CO2 21 09/08/2019   Lab Results  Component Value Date   CHOL 121 05/03/2019   HDL 48 05/03/2019   LDLCALC 35 05/03/2019   TRIG 192 (H) 05/03/2019   CHOLHDL 2.5 05/03/2019   Lab Results  Component Value Date   TSH 3.290 05/03/2019   Lab Results  Component Value Date   HGBA1C 8.4 (H) 09/08/2019   Lab Results  Component Value Date   WBC 6.6 05/03/2019   HGB 13.3 05/03/2019   HCT 38.9 05/03/2019   MCV 89 05/03/2019   PLT 157 05/03/2019   Lab Results  Component Value Date   ALT 28 05/03/2019   AST 28 05/03/2019   ALKPHOS 44 05/03/2019   BILITOT 0.6 05/03/2019     Review of Systems  Constitutional: Negative for appetite change, fatigue, fever and unexpected weight change.  HENT: Negative for  tinnitus and trouble swallowing.   Eyes: Negative for visual disturbance.  Respiratory: Negative for cough, chest tightness and shortness of breath.   Cardiovascular: Negative for chest pain, palpitations and leg swelling.  Gastrointestinal: Negative for abdominal pain, constipation and diarrhea.  Endocrine: Negative for polydipsia and polyuria.  Genitourinary: Negative for dysuria and hematuria.  Musculoskeletal: Negative for arthralgias.  Neurological: Negative for tremors, numbness and headaches.  Psychiatric/Behavioral: Negative for dysphoric mood.    Patient Active Problem List   Diagnosis Date Noted  . Ovarian failure 04/09/2017  . Basal cell carcinoma, leg 07/15/2016  . Abnormal finding on thallium stress test 05/17/2015  . Supraventricular tachycardia ([place]) 05/17/2015  . Hyperlipidemia associated with type 2 diabetes mellitus ([place]) 04/12/2015  . Essential (primary) hypertension 04/12/2015  . History of uterine cancer 04/12/2015  . Type II diabetes mellitus with complication ([place]) [id number]  . Cardiac pacemaker in situ 10/11/2009    No Known Allergies  Past Surgical History:  Procedure Laterality Date  . ABDOMINAL HYSTERECTOMY  1994   Uterine CA  . APPENDECTOMY    . CATARACT EXTRACTION Left 2011  . LITHOTRIPSY  2005  . PACEMAKER INSERTION  2010    Social History   Tobacco Use  . Smoking status: Never Smoker  . Smokeless tobacco: Never Used  Substance Use Topics  . Alcohol use: No    Alcohol/week: 0.0 standard drinks  . Drug use: No     Medication list  has been reviewed and updated.  Current Meds  Medication Sig  . aspirin 81 MG chewable tablet Chew 1 tablet by mouth daily.  . cholecalciferol (VITAMIN D3) 25 MCG (1000 UT) tablet Take 1,000 Units by mouth daily.  Marland Kitchen glimepiride (AMARYL) 2 MG tablet TAKE 1 TABLET BY MOUTH TWICE DAILY WITH FOOD  . glucose blood (ONE TOUCH ULTRA TEST) test strip 1 each by Other route 2 (two) times daily.  Marland Kitchen JANUMET XR  50-1000 MG TB24 TAKE 2 TABLETS BY MOUTH DAILY  . metoprolol succinate (TOPROL-XL) 25 MG 24 hr tablet TAKE 1 TABLET BY MOUTH EVERY DAY  . Multiple Vitamins-Minerals (MULTIVITAL) tablet Take 1 tablet by mouth daily.  Marland Kitchen olmesartan-hydrochlorothiazide (BENICAR HCT) 20-12.5 MG tablet TAKE 1 TABLET BY MOUTH DAILY  . omega-3 acid ethyl esters (LOVAZA) 1 g capsule TAKE 4 CAPSULES BY MOUTH EVERY DAY  . Omega-3 Fatty Acids (FISH OIL) 1000 MG CAPS Take 4 capsules by mouth daily.  . simvastatin (ZOCOR) 80 MG tablet TAKE 1 TABLET(80 MG) BY MOUTH DAILY    PHQ 2/9 Scores 01/10/2020 09/28/2019 09/08/2019 05/03/2019  PHQ - 2 Score 0 0 0 0  PHQ- 9 Score 0 - 0 -    BP Readings from Last 3 Encounters:  01/10/20 136/70  09/08/19 136/82  05/03/19 116/70    Physical Exam Vitals and nursing note reviewed.  Constitutional:      General: She is not in acute distress.    Appearance: She is well-developed.  HENT:     Head: Normocephalic and atraumatic.  Neck:     Vascular: No carotid bruit.  Cardiovascular:     Rate and Rhythm: Normal rate and regular rhythm.     Pulses: Normal pulses.     Heart sounds: No murmur.  Pulmonary:     Effort: Pulmonary effort is normal. No respiratory distress.  Musculoskeletal:        General: Normal range of motion.     Cervical back: Normal range of motion.     Right lower leg: No edema.     Left lower leg: No edema.  Lymphadenopathy:     Cervical: No cervical adenopathy.  Skin:    General: Skin is warm and dry.     Capillary Refill: Capillary refill takes less than 2 seconds.     Findings: No rash.  Neurological:     Mental Status: She is alert and oriented to person, place, and time.  Psychiatric:        Behavior: Behavior normal.        Thought Content: Thought content normal.     Wt Readings from Last 3 Encounters:  01/10/20 200 lb (90.7 kg)  09/28/19 199 lb (90.3 kg)  09/08/19 202 lb (91.6 kg)    BP 136/70   Pulse 65   Temp (!) 96.9 F (36.1 C)  (Oral)   Ht 5\' 8"  (624THL m)   Wt 200 lb (90.7 kg)   SpO2 97%   BMI 30.41 kg/m   Assessment and Plan: 1. Type II diabetes mellitus with complication (HCC) Clinically stable by exam and report without s/s of hypoglycemia. DM complicated by HTN. Tolerating medications Janumet and glimepiride well without side effects or other concerns. She is reminded to schedule her Eye exam and mammogram - Basic metabolic panel - Hemoglobin A1c  2. Essential (primary) hypertension Clinically stable exam with well controlled BP on metoprolol, benicar hct. Tolerating medications without side effects at this time. Pt to continue current regimen and low  sodium diet; benefits of regular exercise as able discussed.  3. Hyperlipidemia associated with type 2 diabetes mellitus ([place]) On daily statin therapy Zocor 80 mg   Partially dictated using Editor, commissioning. Any errors are unintentional.  Halina Maidens, MD [facility]  01/10/2020

## 2020-01-10 NOTE — Patient Instructions (Signed)
Schedule Mammogram  Schedule Eye Doctor appt

## 2020-01-11 LAB — BASIC METABOLIC PANEL
BUN/Creatinine Ratio: 17 (ref 12–28)
BUN: 22 mg/dL (ref 8–27)
CO2: 21 mmol/L (ref 20–29)
Calcium: 10.4 mg/dL — ABNORMAL HIGH (ref 8.7–10.3)
Chloride: 96 mmol/L (ref 96–106)
Creatinine, Ser: 1.32 mg/dL — ABNORMAL HIGH (ref 0.57–1.00)
GFR calc Af Amer: 46 mL/min/{1.73_m2} — ABNORMAL LOW (ref >59)
GFR calc non Af Amer: 40 mL/min/{1.73_m2} — ABNORMAL LOW (ref >59)
Glucose: 305 mg/dL — ABNORMAL HIGH (ref 65–99)
Potassium: 4.3 mmol/L (ref 3.5–5.2)
Sodium: 136 mmol/L (ref 134–144)

## 2020-01-11 LAB — HEMOGLOBIN A1C
Est. average glucose Bld gHb Est-mCnc: 232 mg/dL
Hgb A1c MFr Bld: 9.7 % — ABNORMAL HIGH (ref 4.8–5.6)

## 2020-01-19 DIAGNOSIS — Z23 Encounter for immunization: Secondary | ICD-10-CM | POA: Diagnosis not present

## 2020-02-03 DIAGNOSIS — Z950 Presence of cardiac pacemaker: Secondary | ICD-10-CM | POA: Diagnosis not present

## 2020-02-03 DIAGNOSIS — I495 Sick sinus syndrome: Secondary | ICD-10-CM | POA: Diagnosis not present

## 2020-02-03 DIAGNOSIS — Z45018 Encounter for adjustment and management of other part of cardiac pacemaker: Secondary | ICD-10-CM | POA: Diagnosis not present

## 2020-02-06 ENCOUNTER — Other Ambulatory Visit: Payer: Self-pay

## 2020-02-06 ENCOUNTER — Ambulatory Visit (INDEPENDENT_AMBULATORY_CARE_PROVIDER_SITE_OTHER): Payer: Medicare Other | Admitting: Internal Medicine

## 2020-02-06 ENCOUNTER — Encounter: Payer: Self-pay | Admitting: Internal Medicine

## 2020-02-06 VITALS — BP 138/68 | HR 66 | Temp 97.1°F | Ht 68.0 in | Wt 200.6 lb

## 2020-02-06 DIAGNOSIS — E118 Type 2 diabetes mellitus with unspecified complications: Secondary | ICD-10-CM

## 2020-02-06 MED ORDER — TRULICITY 0.75 MG/0.5ML ~~LOC~~ SOAJ: 0.7500 mg | SUBCUTANEOUS | 0 refills | Status: DC

## 2020-02-06 NOTE — Progress Notes (Signed)
Date:  02/06/2020   Name:  Kimberly Salazar   DOB:  1946/06/29   MRN:  [id number]   Chief Complaint: Diabetes (Starting on injectable medication. Needs samples and instructions.)  Diabetes She presents for her follow-up diabetic visit. She has type 2 diabetes mellitus. Her disease course has been worsening. Pertinent negatives for hypoglycemia include no dizziness or headaches. Pertinent negatives for diabetes include no chest pain, no fatigue, no polydipsia and no polyuria. Current diabetic treatments: janumet and glimepiride. She is compliant with treatment all of the time. Her home blood glucose trend is increasing steadily.  She is here to discuss adding an injectable medication for DM.  Lab Results  Component Value Date   CREATININE 1.32 (H) 01/10/2020   BUN 22 01/10/2020   NA 136 01/10/2020   K 4.3 01/10/2020   CL 96 01/10/2020   CO2 21 01/10/2020   Lab Results  Component Value Date   CHOL 121 05/03/2019   HDL 48 05/03/2019   LDLCALC 35 05/03/2019   TRIG 192 (H) 05/03/2019   CHOLHDL 2.5 05/03/2019   Lab Results  Component Value Date   TSH 3.290 05/03/2019   Lab Results  Component Value Date   HGBA1C 9.7 (H) 01/10/2020   Lab Results  Component Value Date   WBC 6.6 05/03/2019   HGB 13.3 05/03/2019   HCT 38.9 05/03/2019   MCV 89 05/03/2019   PLT 157 05/03/2019   Lab Results  Component Value Date   ALT 28 05/03/2019   AST 28 05/03/2019   ALKPHOS 44 05/03/2019   BILITOT 0.6 05/03/2019     Review of Systems  Constitutional: Negative for chills, fatigue and fever.  Eyes: Negative for visual disturbance.  Respiratory: Negative for chest tightness and shortness of breath.   Cardiovascular: Negative for chest pain.  Endocrine: Negative for polydipsia and polyuria.  Neurological: Negative for dizziness and headaches.    Patient Active Problem List   Diagnosis Date Noted  . Ovarian failure 04/09/2017  . Basal cell carcinoma, leg 07/15/2016  . Abnormal  finding on thallium stress test 05/17/2015  . Hyperlipidemia associated with type 2 diabetes mellitus ([place]) 04/12/2015  . Essential (primary) hypertension 04/12/2015  . History of uterine cancer 04/12/2015  . Type II diabetes mellitus with complication ([place]) [id number]  . Cardiac pacemaker in situ 10/11/2009    No Known Allergies  Past Surgical History:  Procedure Laterality Date  . ABDOMINAL HYSTERECTOMY  1994   Uterine CA  . APPENDECTOMY    . CATARACT EXTRACTION Left 2011  . LITHOTRIPSY  2005  . PACEMAKER INSERTION  2010    Social History   Tobacco Use  . Smoking status: Never Smoker  . Smokeless tobacco: Never Used  Substance Use Topics  . Alcohol use: No    Alcohol/week: 0.0 standard drinks  . Drug use: No     Medication list has been reviewed and updated.  Current Meds  Medication Sig  . aspirin 81 MG chewable tablet Chew 1 tablet by mouth daily.  . cholecalciferol (VITAMIN D3) 25 MCG (1000 UT) tablet Take 1,000 Units by mouth daily.  Marland Kitchen glimepiride (AMARYL) 2 MG tablet TAKE 1 TABLET BY MOUTH TWICE DAILY WITH FOOD  . glucose blood (ONE TOUCH ULTRA TEST) test strip 1 each by Other route 2 (two) times daily.  Marland Kitchen JANUMET XR 50-1000 MG TB24 TAKE 2 TABLETS BY MOUTH DAILY  . metoprolol succinate (TOPROL-XL) 25 MG 24 hr tablet TAKE 1 TABLET BY MOUTH EVERY DAY  .  Multiple Vitamins-Minerals (MULTIVITAL) tablet Take 1 tablet by mouth daily.  Marland Kitchen olmesartan-hydrochlorothiazide (BENICAR HCT) 20-12.5 MG tablet TAKE 1 TABLET BY MOUTH DAILY  . omega-3 acid ethyl esters (LOVAZA) 1 g capsule TAKE 4 CAPSULES BY MOUTH EVERY DAY  . Omega-3 Fatty Acids (FISH OIL) 1000 MG CAPS Take 4 capsules by mouth daily.  . simvastatin (ZOCOR) 80 MG tablet TAKE 1 TABLET(80 MG) BY MOUTH DAILY    PHQ 2/9 Scores 02/06/2020 01/10/2020 09/28/2019 09/08/2019  PHQ - 2 Score 0 0 0 0  PHQ- 9 Score 0 0 - 0    BP Readings from Last 3 Encounters:  02/06/20 138/68  01/10/20 136/70  09/08/19 136/82     Physical Exam Vitals and nursing note reviewed.  Constitutional:      General: She is not in acute distress.    Appearance: She is well-developed.  HENT:     Head: Normocephalic and atraumatic.  Pulmonary:     Effort: Pulmonary effort is normal. No respiratory distress.  Musculoskeletal:        General: Normal range of motion.  Skin:    General: Skin is warm and dry.     Findings: No rash.  Neurological:     Mental Status: She is alert and oriented to person, place, and time.  Psychiatric:        Behavior: Behavior normal.        Thought Content: Thought content normal.     Wt Readings from Last 3 Encounters:  02/06/20 200 lb 9.6 oz (91 kg)  01/10/20 200 lb (90.7 kg)  09/28/19 199 lb (90.3 kg)    BP 138/68   Pulse 66   Temp (!) 97.1 F (36.2 C) (Temporal)   Ht 5\' 8"  (1.727 m)   Wt 200 lb 9.6 oz (91 kg)   SpO2 97%   BMI 30.50 kg/m   Assessment and Plan: 1. Type II diabetes mellitus with complication ([place]) She will continue Janumet and glimepiride Demonstrated Trulcity and Ozempic - she likes the ease of Trulicity so will send in the starting dose to be used for one month then call for the 1.5 mg dose Call for any problems Will adjust meds next visit if A1C is much improved. - Dulaglutide (TRULICITY) [id number] SOPN; Inject 0.75 mg into the skin once a week.  Dispense: 4 pen; Refill: 0   Partially dictated using Editor, commissioning. Any errors are unintentional.  Halina Maidens, MD [facility]  02/06/2020

## 2020-02-17 ENCOUNTER — Other Ambulatory Visit: Payer: Self-pay | Admitting: Internal Medicine

## 2020-03-07 ENCOUNTER — Telehealth: Payer: Self-pay | Admitting: Internal Medicine

## 2020-03-07 ENCOUNTER — Other Ambulatory Visit: Payer: Self-pay | Admitting: Internal Medicine

## 2020-03-07 DIAGNOSIS — E118 Type 2 diabetes mellitus with unspecified complications: Secondary | ICD-10-CM

## 2020-03-07 MED ORDER — TRULICITY 0.75 MG/0.5ML ~~LOC~~ SOAJ: 0.7500 mg | SUBCUTANEOUS | 1 refills | Status: DC

## 2020-03-07 NOTE — Telephone Encounter (Unsigned)
Copied from [place] [id number]. Topic: General - Inquiry >> Mar 07, 2020  2:16 PM Richardo Priest, [place] wrote: Reason for CRM: Patient called in as advised as PCP to give call office when she had taken her last Dulaglutide (TRULICITY) [id number] SOPN. Please advise.

## 2020-03-07 NOTE — Telephone Encounter (Signed)
Refill send. 

## 2020-03-12 ENCOUNTER — Telehealth: Payer: Self-pay | Admitting: Internal Medicine

## 2020-03-12 NOTE — Telephone Encounter (Signed)
Called and spoke with pt informing medication has been sent to her pharmacy.   CM

## 2020-03-12 NOTE — Telephone Encounter (Signed)
Copied from [place] [phone]. Topic: General - Other >> Mar 12, 2020 10:07 AM Hinda Lenis D wrote: Reason for CRM:  pt was told to call back when medicine run out Dulaglutide (TRULICITY) [id number] SOPN / please advice

## 2020-03-18 ENCOUNTER — Other Ambulatory Visit: Payer: Self-pay | Admitting: Internal Medicine

## 2020-03-18 NOTE — Telephone Encounter (Signed)
Requested Prescriptions  Pending Prescriptions Disp Refills  . glimepiride (AMARYL) 2 MG tablet [Pharmacy Med Name: GLIMEPIRIDE 2MG  TABLETS] 180 tablet 1    Sig: TAKE 1 TABLET BY MOUTH TWICE DAILY WITH FOOD     Endocrinology:  Diabetes - Sulfonylureas Failed - 03/18/2020  6:14 AM      Failed - HBA1C is between 0 and 7.9 and within 180 days    Hgb A1c MFr Bld  Date Value Ref Range Status  01/10/2020 9.7 (H) 4.8 - 5.6 % Final    Comment:             Prediabetes: 5.7 - 6.4          Diabetes: >6.4          Glycemic control for adults with diabetes: <7.0          Passed - Valid encounter within last 6 months    Recent Outpatient Visits          1 month ago Type II diabetes mellitus with complication [facility])   [facility] Glean Hess, MD   2 months ago Type II diabetes mellitus with complication [facility])   [facility] Glean Hess, MD   6 months ago Type II diabetes mellitus with complication [facility])   [facility] Glean Hess, MD   10 months ago Essential (primary) hypertension   [facility] Glean Hess, MD   1 year ago Type 2 diabetes mellitus with other circulatory complications [facility])   [facility] Glean Hess, MD      Future Appointments            In 1 month Army Melia, Jesse Sans, MD [facility], [facility]

## 2020-04-18 ENCOUNTER — Other Ambulatory Visit: Payer: Self-pay | Admitting: Internal Medicine

## 2020-05-04 ENCOUNTER — Ambulatory Visit (INDEPENDENT_AMBULATORY_CARE_PROVIDER_SITE_OTHER): Payer: Medicare Other | Admitting: Internal Medicine

## 2020-05-04 ENCOUNTER — Other Ambulatory Visit
Admission: RE | Admit: 2020-05-04 | Discharge: 2020-05-04 | Disposition: A | Discharge status: Home / Self Care | Payer: Medicare Other | Attending: Internal Medicine | Admitting: Internal Medicine

## 2020-05-04 ENCOUNTER — Encounter: Payer: Self-pay | Admitting: Internal Medicine

## 2020-05-04 ENCOUNTER — Other Ambulatory Visit: Payer: Self-pay

## 2020-05-04 VITALS — BP 136/64 | HR 67 | Temp 98.1°F | Ht 68.0 in | Wt 197.0 lb

## 2020-05-04 DIAGNOSIS — E118 Type 2 diabetes mellitus with unspecified complications: Secondary | ICD-10-CM | POA: Diagnosis not present

## 2020-05-04 DIAGNOSIS — E785 Hyperlipidemia, unspecified: Secondary | ICD-10-CM | POA: Diagnosis not present

## 2020-05-04 DIAGNOSIS — Z0000 Encounter for general adult medical examination without abnormal findings: Secondary | ICD-10-CM

## 2020-05-04 DIAGNOSIS — Z13820 Encounter for screening for osteoporosis: Secondary | ICD-10-CM

## 2020-05-04 DIAGNOSIS — E1169 Type 2 diabetes mellitus with other specified complication: Secondary | ICD-10-CM | POA: Diagnosis not present

## 2020-05-04 DIAGNOSIS — I10 Essential (primary) hypertension: Secondary | ICD-10-CM | POA: Diagnosis not present

## 2020-05-04 DIAGNOSIS — Z1231 Encounter for screening mammogram for malignant neoplasm of breast: Secondary | ICD-10-CM | POA: Diagnosis not present

## 2020-05-04 LAB — LIPID PANEL
Cholesterol: 129 mg/dL (ref 0–200)
HDL: 51 mg/dL (ref >40)
LDL Cholesterol: 44 mg/dL (ref 0–99)
Total CHOL/HDL Ratio: 2.5 RATIO
Triglycerides: 170 mg/dL — ABNORMAL HIGH (ref <150)
VLDL: 34 mg/dL (ref 0–40)

## 2020-05-04 LAB — POCT URINALYSIS DIPSTICK
Bilirubin, UA: NEGATIVE
Blood, UA: NEGATIVE
Glucose, UA: POSITIVE — AB
Ketones, UA: NEGATIVE
Leukocytes, UA: NEGATIVE
Nitrite, UA: NEGATIVE
Protein, UA: NEGATIVE
Spec Grav, UA: 1.015 (ref 1.010–1.025)
Urobilinogen, UA: 0.2 E.U./dL
pH, UA: 5 (ref 5.0–8.0)

## 2020-05-04 LAB — CBC WITH DIFFERENTIAL/PLATELET
Abs Immature Granulocytes: 0.02 10*3/uL (ref 0.00–0.07)
Basophils Absolute: 0.1 10*3/uL (ref 0.0–0.1)
Basophils Relative: 1 %
Eosinophils Absolute: 0.1 10*3/uL (ref 0.0–0.5)
Eosinophils Relative: 1 %
HCT: 39.3 % (ref 36.0–46.0)
Hemoglobin: 14.1 g/dL (ref 12.0–15.0)
Immature Granulocytes: 0 %
Lymphocytes Relative: 21 %
Lymphs Abs: 1.6 10*3/uL (ref 0.7–4.0)
MCH: 30.9 pg (ref 26.0–34.0)
MCHC: 35.9 g/dL (ref 30.0–36.0)
MCV: 86.2 fL (ref 80.0–100.0)
Monocytes Absolute: 0.3 10*3/uL (ref 0.1–1.0)
Monocytes Relative: 4 %
Neutro Abs: 5.6 10*3/uL (ref 1.7–7.7)
Neutrophils Relative %: 73 %
Platelets: 172 10*3/uL (ref 150–400)
RBC: 4.56 MIL/uL (ref 3.87–5.11)
RDW: 12.5 % (ref 11.5–15.5)
WBC: 7.7 10*3/uL (ref 4.0–10.5)
nRBC: 0 % (ref 0.0–0.2)

## 2020-05-04 LAB — COMPREHENSIVE METABOLIC PANEL
ALT: 41 U/L (ref 0–44)
AST: 45 U/L — ABNORMAL HIGH (ref 15–41)
Albumin: 4.1 g/dL (ref 3.5–5.0)
Alkaline Phosphatase: 45 U/L (ref 38–126)
Anion gap: 12 (ref 5–15)
BUN: 18 mg/dL (ref 8–23)
CO2: 22 mmol/L (ref 22–32)
Calcium: 9.8 mg/dL (ref 8.9–10.3)
Chloride: 103 mmol/L (ref 98–111)
Creatinine, Ser: 1.07 mg/dL — ABNORMAL HIGH (ref 0.44–1.00)
GFR calc Af Amer: 59 mL/min — ABNORMAL LOW (ref >60)
GFR calc non Af Amer: 51 mL/min — ABNORMAL LOW (ref >60)
Glucose, Bld: 298 mg/dL — ABNORMAL HIGH (ref 70–99)
Potassium: 4.6 mmol/L (ref 3.5–5.1)
Sodium: 137 mmol/L (ref 135–145)
Total Bilirubin: 0.9 mg/dL (ref 0.3–1.2)
Total Protein: 7.4 g/dL (ref 6.5–8.1)

## 2020-05-04 LAB — HEMOGLOBIN A1C
Hgb A1c MFr Bld: 8.2 % — ABNORMAL HIGH (ref 4.8–5.6)
Mean Plasma Glucose: 188.64 mg/dL

## 2020-05-04 LAB — TSH: TSH: 2.6 u[IU]/mL (ref 0.350–4.500)

## 2020-05-04 NOTE — Progress Notes (Signed)
Date:  05/04/2020   Name:  Kimberly Salazar   DOB:  Mar 15, 1946   MRN:  [id number]   Chief Complaint: Annual Exam (foot exam/ breast exam no pap not taking trulicity cost too much )  Kimberly Salazar is a 74 y.o. female who presents today for her Complete Annual Exam. She feels well. She reports exercising walking twice a week . She reports she is sleeping well. Breast complaints none.  Mammogram:  03/2018 DEXA none Pap smear: discontinued Colonoscopy:  04/2018  Immunization History  Administered Date(s) Administered  . Fluad Quad(high Dose 65+) 09/08/2019  . Influenza, High Dose Seasonal PF 09/30/2018  . Influenza,inj,Quad PF,6+ Mos 11/19/2015, 07/15/2016  . Influenza-Unspecified 06/27/2017  . Moderna SARS-COVID-2 Vaccination 12/22/2019, 01/19/2020  . Pneumococcal Conjugate-13 09/15/2014  . Pneumococcal-Unspecified 06/02/2011  . Tdap 06/02/2011  . Zoster 10/27/2006    Hypertension This is a chronic problem. The problem is controlled. Pertinent negatives include no chest pain, headaches, palpitations or shortness of breath. Past treatments include angiotensin blockers, diuretics and beta blockers. The current treatment provides significant improvement.  Diabetes She presents for her follow-up diabetic visit. She has type 2 diabetes mellitus. Pertinent negatives for hypoglycemia include no dizziness, headaches, nervousness/anxiousness or tremors. Pertinent negatives for diabetes include no chest pain, no fatigue, no polydipsia and no polyuria. Current diabetic treatments: janumet, glipizide and Trulicity. She monitors blood glucose at home 1-2 x per week. Her breakfast blood glucose is taken between 9-10 am. Her breakfast blood glucose range is generally 180-200 mg/dl. An ACE inhibitor/angiotensin II receptor blocker is being taken. Eye exam is not current.  Hyperlipidemia The problem is controlled. Pertinent negatives include no chest pain or shortness of breath. Current antihyperlipidemic  treatment includes statins.    Lab Results  Component Value Date   CREATININE 1.32 (H) 01/10/2020   BUN 22 01/10/2020   NA 136 01/10/2020   K 4.3 01/10/2020   CL 96 01/10/2020   CO2 21 01/10/2020   Lab Results  Component Value Date   CHOL 121 05/03/2019   HDL 48 05/03/2019   LDLCALC 35 05/03/2019   TRIG 192 (H) 05/03/2019   CHOLHDL 2.5 05/03/2019   Lab Results  Component Value Date   TSH 3.290 05/03/2019   Lab Results  Component Value Date   HGBA1C 9.7 (H) 01/10/2020   Lab Results  Component Value Date   WBC 6.6 05/03/2019   HGB 13.3 05/03/2019   HCT 38.9 05/03/2019   MCV 89 05/03/2019   PLT 157 05/03/2019   Lab Results  Component Value Date   ALT 28 05/03/2019   AST 28 05/03/2019   ALKPHOS 44 05/03/2019   BILITOT 0.6 05/03/2019     Review of Systems  Constitutional: Negative for chills, fatigue and fever.  HENT: Negative for congestion, hearing loss, tinnitus, trouble swallowing and voice change.   Eyes: Negative for visual disturbance.  Respiratory: Negative for cough, chest tightness, shortness of breath and wheezing.   Cardiovascular: Negative for chest pain, palpitations and leg swelling.  Gastrointestinal: Negative for abdominal pain, constipation, diarrhea and vomiting.  Endocrine: Negative for polydipsia and polyuria.  Genitourinary: Negative for dysuria, frequency, genital sores, vaginal bleeding and vaginal discharge.  Musculoskeletal: Negative for arthralgias, gait problem and joint swelling.  Skin: Negative for color change and rash.  Neurological: Negative for dizziness, tremors, light-headedness and headaches.  Hematological: Negative for adenopathy. Does not bruise/bleed easily.  Psychiatric/Behavioral: Negative for dysphoric mood and sleep disturbance. The patient is not nervous/anxious.  Patient Active Problem List   Diagnosis Date Noted  . Ovarian failure 04/09/2017  . Basal cell carcinoma, leg 07/15/2016  . Abnormal finding on  thallium stress test 05/17/2015  . Hyperlipidemia associated with type 2 diabetes mellitus ([place]) 04/12/2015  . Essential (primary) hypertension 04/12/2015  . History of uterine cancer 04/12/2015  . Type II diabetes mellitus with complication ([place]) 53/66/4403  . Cardiac pacemaker in situ 10/11/2009    No Known Allergies  Past Surgical History:  Procedure Laterality Date  . ABDOMINAL HYSTERECTOMY  1994   Uterine CA  . APPENDECTOMY    . CATARACT EXTRACTION Left 2011  . LITHOTRIPSY  2005  . PACEMAKER INSERTION  2010    Social History   Tobacco Use  . Smoking status: Never Smoker  . Smokeless tobacco: Never Used  Vaping Use  . Vaping Use: Never used  Substance Use Topics  . Alcohol use: No    Alcohol/week: 0.0 standard drinks  . Drug use: No     Medication list has been reviewed and updated.  Current Meds  Medication Sig  . aspirin 81 MG chewable tablet Chew 1 tablet by mouth daily.  . cholecalciferol (VITAMIN D3) 25 MCG (1000 UT) tablet Take 1,000 Units by mouth daily.  Marland Kitchen glimepiride (AMARYL) 2 MG tablet TAKE 1 TABLET BY MOUTH TWICE DAILY WITH FOOD  . glucose blood (ONE TOUCH ULTRA TEST) test strip 1 each by Other route 2 (two) times daily.  Marland Kitchen JANUMET XR 50-1000 MG TB24 TAKE 2 TABLETS BY MOUTH DAILY  . metoprolol succinate (TOPROL-XL) 25 MG 24 hr tablet TAKE 1 TABLET BY MOUTH EVERY DAY  . Multiple Vitamins-Minerals (MULTIVITAL) tablet Take 1 tablet by mouth daily.  Marland Kitchen olmesartan-hydrochlorothiazide (BENICAR HCT) 20-12.5 MG tablet TAKE 1 TABLET BY MOUTH DAILY  . omega-3 acid ethyl esters (LOVAZA) 1 g capsule TAKE 4 CAPSULES BY MOUTH EVERY DAY  . Omega-3 Fatty Acids (FISH OIL) 1000 MG CAPS Take 4 capsules by mouth daily.  . simvastatin (ZOCOR) 80 MG tablet TAKE 1 TABLET(80 MG) BY MOUTH DAILY    PHQ 2/9 Scores 05/04/2020 02/06/2020 01/10/2020 09/28/2019  PHQ - 2 Score 0 0 0 0  PHQ- 9 Score 0 0 0 -    GAD 7 : Generalized Anxiety Score 05/04/2020 02/06/2020  Nervous, Anxious,  on Edge 0 0  Control/stop worrying 0 0  Worry too much - different things 0 0  Trouble relaxing 0 0  Restless 0 0  Easily annoyed or irritable 0 0  Afraid - awful might happen 0 0  Total GAD 7 Score 0 0  Anxiety Difficulty Not difficult at all Not difficult at all    BP Readings from Last 3 Encounters:  05/04/20 136/64  02/06/20 138/68  01/10/20 136/70    Physical Exam Vitals and nursing note reviewed.  Constitutional:      General: She is not in acute distress.    Appearance: She is well-developed.  HENT:     Head: Normocephalic and atraumatic.     Right Ear: Tympanic membrane and ear canal normal.     Left Ear: Tympanic membrane and ear canal normal.     Nose:     Right Sinus: No maxillary sinus tenderness.     Left Sinus: No maxillary sinus tenderness.  Eyes:     General: No scleral icterus.       Right eye: No discharge.        Left eye: No discharge.     Conjunctiva/sclera: Conjunctivae  normal.  Neck:     Thyroid: No thyromegaly.     Vascular: No carotid bruit.  Cardiovascular:     Rate and Rhythm: Normal rate and regular rhythm.     Pulses: Normal pulses.     Heart sounds: Normal heart sounds.  Pulmonary:     Effort: Pulmonary effort is normal. No respiratory distress.     Breath sounds: No wheezing.  Chest:     Breasts:        Right: No mass, nipple discharge, skin change or tenderness.        Left: No mass, nipple discharge, skin change or tenderness.  Abdominal:     General: Bowel sounds are normal.     Palpations: Abdomen is soft.     Tenderness: There is no abdominal tenderness.  Musculoskeletal:     Cervical back: Normal range of motion. No erythema.     Right lower leg: No edema.     Left lower leg: No edema.  Lymphadenopathy:     Cervical: No cervical adenopathy.  Skin:    General: Skin is warm and dry.     Findings: No rash.  Neurological:     Mental Status: She is alert and oriented to person, place, and time.     Cranial Nerves: No  cranial nerve deficit.     Sensory: No sensory deficit.     Deep Tendon Reflexes: Reflexes are normal and symmetric.  Psychiatric:        Attention and Perception: Attention normal.        Mood and Affect: Mood normal.     Wt Readings from Last 3 Encounters:  05/04/20 197 lb (89.4 kg)  02/06/20 200 lb 9.6 oz (91 kg)  01/10/20 200 lb (90.7 kg)    BP 136/64   Pulse 67   Temp 98.1 F (36.7 C) (Oral)   Ht 5\' 8"  (1.727 m)   Wt 197 lb (89.4 kg)   SpO2 97%   BMI 29.95 kg/m   Assessment and Plan: 1. Essential (primary) hypertension Clinically stable exam with well controlled BP. Tolerating medications without side effects at this time. Pt to continue current regimen and low sodium diet; benefits of regular exercise as able discussed. - CBC with Differential/Platelet  2. Type II diabetes mellitus with complication (HCC) BS not controlled last visit and pt unable to afford Trulicity.  She is apparently in the donut hole and probably has too high an income for assistance May need to begin insulin injections but will wait for labs Pt is reminded to schedule eye exam - Comprehensive metabolic panel - Hemoglobin A1c - TSH - POCT urinalysis dipstick  3. Hyperlipidemia associated with type 2 diabetes mellitus ([place]) Tolerating statin medication without side effects at this time LDL is at goal of < 70 on current dose Continue same therapy without change at this time. - Lipid panel  4. Encounter for screening mammogram for breast cancer Pt will schedule with [facility]  5. Encounter for screening for osteoporosis Could not find a dx code that would pass the computer screening process but it seems that [facility] was able to. Will have someone call her to schedule here in [place].  6. Healthcare maintenance Issues addressed   Partially dictated using Editor, commissioning. Any errors are unintentional.  Halina Maidens, MD [facility]  05/04/2020

## 2020-05-07 ENCOUNTER — Other Ambulatory Visit: Payer: Self-pay | Admitting: Internal Medicine

## 2020-05-07 DIAGNOSIS — Z13820 Encounter for screening for osteoporosis: Secondary | ICD-10-CM

## 2020-05-07 DIAGNOSIS — M810 Age-related osteoporosis without current pathological fracture: Secondary | ICD-10-CM

## 2020-05-18 ENCOUNTER — Other Ambulatory Visit: Payer: Self-pay | Admitting: Internal Medicine

## 2020-05-22 ENCOUNTER — Other Ambulatory Visit: Payer: Medicare Other

## 2020-08-10 DIAGNOSIS — Z950 Presence of cardiac pacemaker: Secondary | ICD-10-CM | POA: Diagnosis not present

## 2020-08-10 DIAGNOSIS — I495 Sick sinus syndrome: Secondary | ICD-10-CM | POA: Diagnosis not present

## 2020-08-10 DIAGNOSIS — Z45018 Encounter for adjustment and management of other part of cardiac pacemaker: Secondary | ICD-10-CM | POA: Diagnosis not present

## 2020-08-20 ENCOUNTER — Other Ambulatory Visit: Payer: Self-pay | Admitting: Internal Medicine

## 2020-09-09 ENCOUNTER — Other Ambulatory Visit: Payer: Self-pay | Admitting: Internal Medicine

## 2020-09-09 NOTE — Telephone Encounter (Signed)
Requested Prescriptions  Pending Prescriptions Disp Refills   glimepiride (AMARYL) 2 MG tablet [Pharmacy Med Name: GLIMEPIRIDE 2MG  TABLETS] 180 tablet 0    Sig: TAKE 1 TABLET BY MOUTH TWICE DAILY WITH FOOD     Endocrinology:  Diabetes - Sulfonylureas Failed - 09/09/2020 10:39 AM      Failed - HBA1C is between 0 and 7.9 and within 180 days    Hgb A1c MFr Bld  Date Value Ref Range Status  05/04/2020 8.2 (H) 4.8 - 5.6 % Final    Comment:    (NOTE) Pre diabetes:          5.7%-6.4%  Diabetes:              >6.4%  Glycemic control for   <7.0% adults with diabetes          Passed - Valid encounter within last 6 months    Recent Outpatient Visits          4 months ago Essential (primary) hypertension   [facility] Glean Hess, MD   7 months ago Type II diabetes mellitus with complication [facility])   [facility] Glean Hess, MD   8 months ago Type II diabetes mellitus with complication [facility])   [facility] Glean Hess, MD   1 year ago Type II diabetes mellitus with complication [facility])   [facility] Glean Hess, MD   1 year ago Essential (primary) hypertension   [facility] Glean Hess, MD      Future Appointments            In 4 days Glean Hess, MD [facility], [place]   In 7 months Army Melia, Jesse Sans, MD [facility], [facility]

## 2020-09-12 ENCOUNTER — Telehealth: Payer: Self-pay | Admitting: *Deleted

## 2020-09-12 NOTE — Chronic Care Management (AMB) (Signed)
  Chronic Care Management   Outreach Note  09/12/2020 Name: Kimberly Salazar MRN: [id number] DOB: [birth year]  [name] is a 74 y.o. year old female who is a primary care patient of Glean Hess, MD. I reached out to [name] by phone today in response to a referral sent by Ms[employer] health plan.     An unsuccessful telephone outreach was attempted today. The patient was referred to the case management team for assistance with care management and care coordination.   Follow Up Plan: A HIPAA compliant phone message was left for the patient providing contact information and requesting a return call. The care management team will reach out to the patient again over the next 7 days. If patient returns call to provider office, please advise to call [place] at [phone].  [facility]

## 2020-09-13 ENCOUNTER — Encounter: Payer: Self-pay | Admitting: Internal Medicine

## 2020-09-13 ENCOUNTER — Other Ambulatory Visit: Payer: Self-pay

## 2020-09-13 ENCOUNTER — Ambulatory Visit (INDEPENDENT_AMBULATORY_CARE_PROVIDER_SITE_OTHER): Payer: Medicare Other | Admitting: Internal Medicine

## 2020-09-13 VITALS — BP 139/70 | HR 66 | Ht 68.0 in | Wt 198.0 lb

## 2020-09-13 DIAGNOSIS — Z23 Encounter for immunization: Secondary | ICD-10-CM

## 2020-09-13 DIAGNOSIS — E1169 Type 2 diabetes mellitus with other specified complication: Secondary | ICD-10-CM | POA: Diagnosis not present

## 2020-09-13 DIAGNOSIS — E785 Hyperlipidemia, unspecified: Secondary | ICD-10-CM

## 2020-09-13 DIAGNOSIS — E118 Type 2 diabetes mellitus with unspecified complications: Secondary | ICD-10-CM | POA: Diagnosis not present

## 2020-09-13 DIAGNOSIS — I10 Essential (primary) hypertension: Secondary | ICD-10-CM

## 2020-09-13 NOTE — Progress Notes (Signed)
Date:  09/13/2020   Name:  Kimberly Salazar   DOB:  1946/05/29   MRN:  [id number]   Chief Complaint: Diabetes (Follow up.)  Hypertension This is a chronic problem. The problem is controlled. Pertinent negatives include no chest pain, headaches, palpitations or shortness of breath. Past treatments include beta blockers, diuretics and angiotensin blockers. The current treatment provides significant improvement. There are no compliance problems.   Diabetes She presents for her follow-up diabetic visit. She has type 2 diabetes mellitus. Pertinent negatives for hypoglycemia include no headaches or tremors. Pertinent negatives for diabetes include no chest pain, no fatigue, no polydipsia and no polyuria. Current diabetic treatments: janumet, glimepiride, unable to afford Trulicity. She monitors blood glucose at home 1-2 x per day. There is no change in her home blood glucose trend. Her breakfast blood glucose is taken between 8-9 am. Her breakfast blood glucose range is generally 180-200 mg/dl. An ACE inhibitor/angiotensin II receptor blocker is being taken. Eye exam is not current.  Hyperlipidemia The problem is controlled. Pertinent negatives include no chest pain or shortness of breath. Current antihyperlipidemic treatment includes statins.    Lab Results  Component Value Date   CREATININE 1.07 (H) 05/04/2020   BUN 18 05/04/2020   NA 137 05/04/2020   K 4.6 05/04/2020   CL 103 05/04/2020   CO2 22 05/04/2020   Lab Results  Component Value Date   CHOL 129 05/04/2020   HDL 51 05/04/2020   LDLCALC 44 05/04/2020   TRIG 170 (H) 05/04/2020   CHOLHDL 2.5 05/04/2020   Lab Results  Component Value Date   TSH 2.600 05/04/2020   Lab Results  Component Value Date   HGBA1C 8.2 (H) 05/04/2020   Lab Results  Component Value Date   WBC 7.7 05/04/2020   HGB 14.1 05/04/2020   HCT 39.3 05/04/2020   MCV 86.2 05/04/2020   PLT 172 05/04/2020   Lab Results  Component Value Date   ALT 41  05/04/2020   AST 45 (H) 05/04/2020   ALKPHOS 45 05/04/2020   BILITOT 0.9 05/04/2020     Review of Systems  Constitutional: Negative for appetite change, fatigue, fever and unexpected weight change.  HENT: Negative for tinnitus and trouble swallowing.   Eyes: Negative for visual disturbance.  Respiratory: Negative for cough, chest tightness and shortness of breath.   Cardiovascular: Negative for chest pain, palpitations and leg swelling.  Gastrointestinal: Negative for abdominal pain.  Endocrine: Negative for polydipsia and polyuria.  Genitourinary: Negative for dysuria and hematuria.  Musculoskeletal: Negative for arthralgias.  Neurological: Negative for tremors, numbness and headaches.  Psychiatric/Behavioral: Negative for dysphoric mood.    Patient Active Problem List   Diagnosis Date Noted  . Ovarian failure 04/09/2017  . Basal cell carcinoma, leg 07/15/2016  . Abnormal finding on thallium stress test 05/17/2015  . Hyperlipidemia associated with type 2 diabetes mellitus ([place]) 04/12/2015  . Essential (primary) hypertension 04/12/2015  . History of uterine cancer 04/12/2015  . Type II diabetes mellitus with complication ([place]) 89/38/1017  . Cardiac pacemaker in situ 10/11/2009    No Known Allergies  Past Surgical History:  Procedure Laterality Date  . ABDOMINAL HYSTERECTOMY  1994   Uterine CA  . APPENDECTOMY    . CATARACT EXTRACTION Left 2011  . LITHOTRIPSY  2005  . PACEMAKER INSERTION  2010    Social History   Tobacco Use  . Smoking status: Never Smoker  . Smokeless tobacco: Never Used  Vaping Use  . Vaping Use:  Never used  Substance Use Topics  . Alcohol use: No    Alcohol/week: 0.0 standard drinks  . Drug use: No     Medication list has been reviewed and updated.  Current Meds  Medication Sig  . aspirin 81 MG chewable tablet Chew 1 tablet by mouth daily.  . cholecalciferol (VITAMIN D3) 25 MCG (1000 UT) tablet Take 1,000 Units by mouth daily.  Marland Kitchen  glimepiride (AMARYL) 2 MG tablet TAKE 1 TABLET BY MOUTH TWICE DAILY WITH FOOD  . glucose blood (ONE TOUCH ULTRA TEST) test strip 1 each by Other route 2 (two) times daily.  Marland Kitchen JANUMET XR 50-1000 MG TB24 TAKE 2 TABLETS BY MOUTH DAILY  . metoprolol succinate (TOPROL-XL) 25 MG 24 hr tablet TAKE 1 TABLET BY MOUTH EVERY DAY  . Multiple Vitamins-Minerals (MULTIVITAL) tablet Take 1 tablet by mouth daily.  Marland Kitchen olmesartan-hydrochlorothiazide (BENICAR HCT) 20-12.5 MG tablet TAKE 1 TABLET BY MOUTH DAILY  . omega-3 acid ethyl esters (LOVAZA) 1 g capsule TAKE 4 CAPSULES BY MOUTH EVERY DAY  . simvastatin (ZOCOR) 80 MG tablet TAKE 1 TABLET(80 MG) BY MOUTH DAILY    PHQ 2/9 Scores 09/13/2020 05/04/2020 02/06/2020 01/10/2020  PHQ - 2 Score 0 0 0 0  PHQ- 9 Score 0 0 0 0    GAD 7 : Generalized Anxiety Score 09/13/2020 05/04/2020 02/06/2020  Nervous, Anxious, on Edge 0 0 0  Control/stop worrying 0 0 0  Worry too much - different things 0 0 0  Trouble relaxing 0 0 0  Restless 0 0 0  Easily annoyed or irritable 0 0 0  Afraid - awful might happen 0 0 0  Total GAD 7 Score 0 0 0  Anxiety Difficulty Not difficult at all Not difficult at all Not difficult at all    BP Readings from Last 3 Encounters:  09/13/20 139/70  05/04/20 136/64  02/06/20 138/68    Physical Exam Vitals and nursing note reviewed.  Constitutional:      General: She is not in acute distress.    Appearance: She is well-developed.  HENT:     Head: Normocephalic and atraumatic.  Cardiovascular:     Rate and Rhythm: Normal rate and regular rhythm.     Pulses: Normal pulses.     Heart sounds: Normal heart sounds.  Pulmonary:     Effort: Pulmonary effort is normal. No respiratory distress.  Musculoskeletal:     Cervical back: Normal range of motion.     Right lower leg: No edema.     Left lower leg: No edema.  Skin:    General: Skin is warm and dry.     Capillary Refill: Capillary refill takes less than 2 seconds.     Findings: No rash.    Neurological:     Mental Status: She is alert and oriented to person, place, and time.  Psychiatric:        Mood and Affect: Mood normal.     Wt Readings from Last 3 Encounters:  09/13/20 198 lb (89.8 kg)  05/04/20 197 lb (89.4 kg)  02/06/20 200 lb 9.6 oz (91 kg)    BP 139/70   Pulse 66   Ht 5\' 8"  (1.727 m)   Wt 198 lb (89.8 kg)   SpO2 99%   BMI 30.11 kg/m   Assessment and Plan: 1. Type II diabetes mellitus with complication (HCC) C3J improved last visit Unable to afford Trulicity and Jardiance caused persistent yeast May need to refer to Pharmacy for patient  assistance - Hemoglobin V7B - Basic metabolic panel  2. Essential (primary) hypertension Clinically stable exam with well controlled BP on benicar hct and metoprolol. Tolerating medications without side effects at this time. Pt to continue current regimen and low sodium diet; benefits of regular exercise as able discussed.  3. Hyperlipidemia associated with type 2 diabetes mellitus ([place]) On statin therapy with goal LDL < 70 achieved  4. Need for immunization against influenza - Flu Vaccine QUAD High Dose(Fluad)   Partially dictated using Editor, commissioning. Any errors are unintentional.  Halina Maidens, MD [facility]  09/13/2020

## 2020-09-14 LAB — BASIC METABOLIC PANEL
BUN/Creatinine Ratio: 19 (ref 12–28)
BUN: 18 mg/dL (ref 8–27)
CO2: 22 mmol/L (ref 20–29)
Calcium: 9.8 mg/dL (ref 8.7–10.3)
Chloride: 102 mmol/L (ref 96–106)
Creatinine, Ser: 0.97 mg/dL (ref 0.57–1.00)
GFR calc Af Amer: 66 mL/min/{1.73_m2} (ref >59)
GFR calc non Af Amer: 57 mL/min/{1.73_m2} — ABNORMAL LOW (ref >59)
Glucose: 254 mg/dL — ABNORMAL HIGH (ref 65–99)
Potassium: 4.7 mmol/L (ref 3.5–5.2)
Sodium: 139 mmol/L (ref 134–144)

## 2020-09-14 LAB — HEMOGLOBIN A1C
Est. average glucose Bld gHb Est-mCnc: 203 mg/dL
Hgb A1c MFr Bld: 8.7 % — ABNORMAL HIGH (ref 4.8–5.6)

## 2020-09-19 NOTE — Chronic Care Management (AMB) (Signed)
  Chronic Care Management   Note  09/19/2020 Name: Kimberly Salazar MRN: [id number] DOB: [birth year]  Kimberly Salazar is a 74 y.o. year old female who is a primary care patient of Glean Hess, MD. I reached out to [name] by phone today in response to a referral sent by Ms[employer] health plan.     [name] was given information about Chronic Care Management services today including:  1. CCM service includes personalized support from designated clinical staff supervised by her physician, including individualized plan of care and coordination with other care providers 2. 24/7 contact phone numbers for assistance for urgent and routine care needs. 3. Service will only be billed when office clinical staff spend 20 minutes or more in a month to coordinate care. 4. Only one practitioner may furnish and bill the service in a calendar month. 5. The patient may stop CCM services at any time (effective at the end of the month) by phone call to the office staff. 6. The patient will be responsible for cost sharing (co-pay) of up to 20% of the service fee (after annual deductible is met).  Patient agreed to services and verbal consent obtained.   Follow up plan: Telephone appointment with care management team member scheduled for: 10/09/2020  [facility]

## 2020-09-28 ENCOUNTER — Other Ambulatory Visit: Payer: Self-pay | Admitting: Internal Medicine

## 2020-09-28 NOTE — Telephone Encounter (Signed)
Requested Prescriptions  Pending Prescriptions Disp Refills  . simvastatin (ZOCOR) 80 MG tablet [Pharmacy Med Name: SIMVASTATIN 80MG  TABLETS] 90 tablet 2    Sig: TAKE 1 TABLET(80 MG) BY MOUTH DAILY     Cardiovascular:  Antilipid - Statins Failed - 09/28/2020  4:02 PM      Failed - Triglycerides in normal range and within 360 days    Triglycerides  Date Value Ref Range Status  05/04/2020 170 (H) <150 mg/dL Final         Passed - Total Cholesterol in normal range and within 360 days    Cholesterol, Total  Date Value Ref Range Status  05/03/2019 121 100 - 199 mg/dL Final   Cholesterol  Date Value Ref Range Status  05/04/2020 129 0 - 200 mg/dL Final         Passed - LDL in normal range and within 360 days    LDL Calculated  Date Value Ref Range Status  05/03/2019 35 0 - 99 mg/dL Final   LDL Cholesterol  Date Value Ref Range Status  05/04/2020 44 0 - 99 mg/dL Final    Comment:           Total Cholesterol/HDL:CHD Risk Coronary Heart Disease Risk Table                     Men   Women  1/2 Average Risk   3.4   3.3  Average Risk       5.0   4.4  2 X Average Risk   9.6   7.1  3 X Average Risk  23.4   11.0        Use the calculated Patient Ratio above and the CHD Risk Table to determine the patient's CHD Risk.        ATP III CLASSIFICATION (LDL):  <100     mg/dL   Optimal  100-129  mg/dL   Near or Above                    Optimal  130-159  mg/dL   Borderline  160-189  mg/dL   High  >190     mg/dL   Very High Performed at [facility], [address] [place], [place] [address]          Passed - HDL in normal range and within 360 days    HDL  Date Value Ref Range Status  05/04/2020 51 >40 mg/dL Final  05/03/2019 48 >39 mg/dL Final         Passed - Patient is not pregnant      Passed - Valid encounter within last 12 months    Recent Outpatient Visits          2 weeks ago Type II diabetes mellitus with complication [facility])   [facility]  Glean Hess, MD   4 months ago Essential (primary) hypertension   [facility] Glean Hess, MD   7 months ago Type II diabetes mellitus with complication [facility])   [facility] Glean Hess, MD   8 months ago Type II diabetes mellitus with complication [facility])   [facility] Glean Hess, MD   1 year ago Type II diabetes mellitus with complication [facility])   [facility] Glean Hess, MD      Future Appointments            In 3 months Army Melia Jesse Sans, MD  [place]   In 7 months Army Melia, Jesse Sans, MD [place]

## 2020-09-30 ENCOUNTER — Other Ambulatory Visit: Payer: Self-pay | Admitting: Internal Medicine

## 2020-09-30 DIAGNOSIS — I10 Essential (primary) hypertension: Secondary | ICD-10-CM

## 2020-09-30 NOTE — Telephone Encounter (Signed)
Requested Prescriptions  Pending Prescriptions Disp Refills  . olmesartan-hydrochlorothiazide (BENICAR HCT) 20-12.5 MG tablet [Pharmacy Med Name: OLMESARTAN MEDOX/HCTZ 20-12.5MG  TAB] 90 tablet 0    Sig: TAKE 1 TABLET BY MOUTH DAILY     Cardiovascular: ARB + Diuretic Combos Passed - 09/30/2020  6:18 AM      Passed - K in normal range and within 180 days    Potassium  Date Value Ref Range Status  09/13/2020 4.7 3.5 - 5.2 mmol/L Final         Passed - Na in normal range and within 180 days    Sodium  Date Value Ref Range Status  09/13/2020 139 134 - 144 mmol/L Final         Passed - Cr in normal range and within 180 days    Creatinine, Ser  Date Value Ref Range Status  09/13/2020 0.97 0.57 - 1.00 mg/dL Final         Passed - Ca in normal range and within 180 days    Calcium  Date Value Ref Range Status  09/13/2020 9.8 8.7 - 10.3 mg/dL Final         Passed - Patient is not pregnant      Passed - Last BP in normal range    BP Readings from Last 1 Encounters:  09/13/20 139/70         Passed - Valid encounter within last 6 months    Recent Outpatient Visits          2 weeks ago Type II diabetes mellitus with complication [facility])   [facility] Gle[facility], MD   4 months ago Essential (primary) hypertension   [facility] Glean Hess, MD   7 months ago Type II diabetes mellitus with complication [facility])   [facility] Glean Hess, MD   8 months ago Type II diabetes mellitus with complication [facility])   [facility] Glean Hess, MD   1 year ago Type II diabetes mellitus with complication [facility])   [facility] Glean Hess, MD      Future Appointments            In 3 months Army Melia Jesse Sans, MD [facility], [place]   In 7 months Army Melia Jesse Sans, MD [facility], [facility]           '

## 2020-10-01 ENCOUNTER — Ambulatory Visit (INDEPENDENT_AMBULATORY_CARE_PROVIDER_SITE_OTHER): Payer: Medicare Other

## 2020-10-01 ENCOUNTER — Other Ambulatory Visit: Payer: Self-pay

## 2020-10-01 VITALS — BP 138/72 | HR 80 | Temp 98.0°F | Resp 16 | Ht 68.0 in | Wt 198.2 lb

## 2020-10-01 DIAGNOSIS — Z0000 Encounter for general adult medical examination without abnormal findings: Secondary | ICD-10-CM

## 2020-10-01 DIAGNOSIS — Z780 Asymptomatic menopausal state: Secondary | ICD-10-CM | POA: Diagnosis not present

## 2020-10-01 NOTE — Progress Notes (Signed)
Subjective:   Kimberly Salazar is a 74 y.o. female who presents for Medicare Annual (Subsequent) preventive examination.  Review of Systems     Cardiac Risk Factors include: advanced age (>16men, >37 women);dyslipidemia;hypertension;diabetes mellitus;obesity (BMI >30kg/m2)     Objective:    Today's Vitals   10/01/20 1440  BP: 138/72  Pulse: 80  Resp: 16  Temp: 98 F (36.7 C)  TempSrc: Oral  SpO2: 96%  Weight: 198 lb 3.2 oz (89.9 kg)  Height: 5\' 8"  (1.727 m)   Body mass index is 30.14 kg/m.  Advanced Directives 10/01/2020 09/28/2019 03/23/2018 11/19/2015  Does Patient Have a Medical Advance Directive? No No No No  Would patient like information on creating a medical advance directive? No - Patient declined Yes (MAU/Ambulatory/Procedural Areas - Information given) No - Patient declined No - patient declined information    Current Medications (verified) Outpatient Encounter Medications as of 10/01/2020  Medication Sig  . aspirin 81 MG chewable tablet Chew 1 tablet by mouth daily.  . cholecalciferol (VITAMIN D3) 25 MCG (1000 UT) tablet Take 1,000 Units by mouth daily.  Marland Kitchen glimepiride (AMARYL) 2 MG tablet TAKE 1 TABLET BY MOUTH TWICE DAILY WITH FOOD  . glucose blood (ONE TOUCH ULTRA TEST) test strip 1 each by Other route 2 (two) times daily.  Marland Kitchen JANUMET XR 50-1000 MG TB24 TAKE 2 TABLETS BY MOUTH DAILY  . metoprolol succinate (TOPROL-XL) 25 MG 24 hr tablet TAKE 1 TABLET BY MOUTH EVERY DAY  . Multiple Vitamins-Minerals (MULTIVITAL) tablet Take 1 tablet by mouth daily.  Marland Kitchen olmesartan-hydrochlorothiazide (BENICAR HCT) 20-12.5 MG tablet TAKE 1 TABLET BY MOUTH DAILY  . omega-3 acid ethyl esters (LOVAZA) 1 g capsule TAKE 4 CAPSULES BY MOUTH EVERY DAY  . simvastatin (ZOCOR) 80 MG tablet TAKE 1 TABLET(80 MG) BY MOUTH DAILY   No facility-administered encounter medications on file as of 10/01/2020.    Allergies (verified) Patient has no known allergies.   History: Past Medical History:    Diagnosis Date  . Diabetes mellitus without complication ([place])   . Hyperlipidemia   . Hypertension    Past Surgical History:  Procedure Laterality Date  . ABDOMINAL HYSTERECTOMY  1994   Uterine CA  . APPENDECTOMY    . CATARACT EXTRACTION Left 2011  . LITHOTRIPSY  2005  . PACEMAKER INSERTION  2010   Family History  Problem Relation Age of Onset  . Diabetes Mother   . Heart failure Father    Social History   Socioeconomic History  . Marital status: Widowed    Spouse name: Not on file  . Number of children: 2  . Years of education: Not on file  . Highest education level: Some college, no degree  Occupational History  . Not on file  Tobacco Use  . Smoking status: Never Smoker  . Smokeless tobacco: Never Used  Vaping Use  . Vaping Use: Never used  Substance and Sexual Activity  . Alcohol use: No    Alcohol/week: 0.0 standard drinks  . Drug use: No  . Sexual activity: Not on file  Other Topics Concern  . Not on file  Social History Narrative   Pt's daughter lives with her   Social Determinants of Health   Financial Resource Strain: Low Risk   . Difficulty of Paying Living Expenses: Not very hard  Food Insecurity: No Food Insecurity  . Worried About Charity fundraiser in the Last Year: Never true  . Ran Out of Food in the Last Year:  Never true  Transportation Needs: No Transportation Needs  . Lack of Transportation (Medical): No  . Lack of Transportation (Non-Medical): No  Physical Activity: Insufficiently Active  . Days of Exercise per Week: 2 days  . Minutes of Exercise per Session: 20 min  Stress: No Stress Concern Present  . Feeling of Stress : Not at all  Social Connections: Socially Isolated  . Frequency of Communication with Friends and Family: More than three times a week  . Frequency of Social Gatherings with Friends and Family: Three times a week  . Attends Religious Services: Never  . Active Member of Clubs or Organizations: No  . Attends Theatre manager Meetings: Never  . Marital Status: Widowed    Tobacco Counseling Counseling given: Not Answered   Clinical Intake:  Pre-visit preparation completed: Yes  Pain : No/denies pain     BMI - recorded: 30.14 Nutritional Status: BMI > 30  Obese Nutritional Risks: None Diabetes: Yes CBG done?: No Did pt. bring in CBG monitor from home?: No  How often do you need to have someone help you when you read instructions, pamphlets, or other written materials from your doctor or pharmacy?: 1 - Never  Nutrition Risk Assessment:  Has the patient had any N/V/D within the last 2 months?  No  Does the patient have any non-healing wounds?  No  Has the patient had any unintentional weight loss or weight gain?  No   Diabetes:  Is the patient diabetic?  Yes  If diabetic, was a CBG obtained today?  No  Did the patient bring in their glucometer from home?  No  How often do you monitor your CBG's? Pt does not actively check blood sugar but has supplies at home.   Financial Strains and Diabetes Management:  Are you having any financial strains with the device, your supplies or your medication? No .  Does the patient want to be seen by Chronic Care Management for management of their diabetes?  Yes  already enrolled Would the patient like to be referred to a Nutritionist or for Diabetic Management?  No   Diabetic Exams:  Diabetic Eye Exam: Completed 04/26/18. Overdue for diabetic eye exam. Pt has been advised about the importance in completing this exam.   Diabetic Foot Exam: Completed 05/04/20.   Interpreter Needed?: No  Information entered by :: Clemetine Marker LPN   Activities of Daily Living In your present state of health, do you have any difficulty performing the following activities: 10/01/2020  Hearing? N  Comment declines hearing aids  Vision? N  Difficulty concentrating or making decisions? N  Walking or climbing stairs? N  Dressing or bathing? N  Doing errands,  shopping? N  Preparing Food and eating ? N  Using the Toilet? N  In the past six months, have you accidently leaked urine? N  Do you have problems with loss of bowel control? N  Managing your Medications? N  Managing your Finances? N  Housekeeping or managing your Housekeeping? N  Some recent data might be hidden    Patient Care Team: Glean Hess, MD as PCP - General (Internal Medicine) Luana Shu, DO as Referring Physician (Cardiology) Edwyna Ready, MD as Referring Physician (Cardiology) Magda Kiel Erlene Senters, MD as Referring Physician (Dermatology) [facility] (Optometry) Vladimir Faster, [facility] (Pharmacist)  Indicate any recent Medical Services you may have received from other than [facility] providers in the past year (date may be approximate).  Assessment:   This is a routine wellness examination for [place].  Hearing/Vision screen  Hearing Screening   125Hz  250Hz  500Hz  1000Hz  2000Hz  3000Hz  4000Hz  6000Hz  8000Hz   Right ear:           Left ear:           Comments: Pt denies hearing difficulty  Vision Screening Comments: Annual vision screenings at [facility] in [place]; past due for exam  Dietary issues and exercise activities discussed: Current Exercise Habits: Home exercise routine, Type of exercise: walking, Time (Minutes): 20, Frequency (Times/Week): 2, Weekly Exercise (Minutes/Week): 40, Intensity: Mild, Exercise limited by: None identified  Goals    . Increase physical activity     Recommend increasing physical activity to at least 3 days per week      Depression Screen PHQ 2/9 Scores 10/01/2020 09/13/2020 05/04/2020 02/06/2020 01/10/2020 09/28/2019 09/08/2019  PHQ - 2 Score 0 0 0 0 0 0 0  PHQ- 9 Score - 0 0 0 0 - 0    Fall Risk Fall Risk  10/01/2020 09/13/2020 05/04/2020 02/06/2020 09/28/2019  Falls in the past year? 0 0 1 0 0  Number falls in past yr: 0 0 0 0 0  Injury with Fall? 0 0 0 0 0  Risk for fall due to : No Fall Risks No Fall Risks No Fall  Risks No Fall Risks -  Follow up Falls prevention discussed Falls evaluation completed Falls evaluation completed Falls evaluation completed Falls prevention discussed    FALL RISK PREVENTION PERTAINING TO THE HOME:  Any stairs in or around the home? Yes  If so, are there any without handrails? Yes  - front porch steps  Home free of loose throw rugs in walkways, pet beds, electrical cords, etc? Yes  Adequate lighting in your home to reduce risk of falls? Yes   ASSISTIVE DEVICES UTILIZED TO PREVENT FALLS:  Life alert? No  Use of a cane, walker or w/c? No  Grab bars in the bathroom? No  Shower chair or bench in shower? No  Elevated toilet seat or a handicapped toilet? No   TIMED UP AND GO:  Was the test performed? Yes .  Length of time to ambulate 10 feet: 4 sec.   Gait steady and fast without use of assistive device  Cognitive Function:     6CIT Screen 09/28/2019  What Year? 0 points  What month? 0 points  What time? 0 points  Count back from 20 0 points  Months in reverse 0 points  Repeat phrase 0 points  Total Score 0    Immunizations Immunization History  Administered Date(s) Administered  . Fluad Quad(high Dose 65+) 09/08/2019, 09/13/2020  . Influenza, High Dose Seasonal PF 09/30/2018  . Influenza,inj,Quad PF,6+ Mos 11/19/2015, 07/15/2016  . Influenza-Unspecified 06/27/2017  . Moderna SARS-COVID-2 Vaccination 12/22/2019, 01/19/2020  . Pneumococcal Conjugate-13 09/15/2014  . Pneumococcal-Unspecified 06/02/2011  . Tdap 06/02/2011  . Zoster 10/27/2006    TDAP status: Up to date  Flu Vaccine status: Up to date  Pneumococcal vaccine status: Up to date  Covid-19 vaccine status: Completed vaccines  Qualifies for Shingles Vaccine? Yes   Zostavax completed Yes   Shingrix Completed?: No.    Education has been provided regarding the importance of this vaccine. Patient has been advised to call insurance company to determine out of pocket expense if they have not  yet received this vaccine. Advised may also receive vaccine at local pharmacy or Health Dept. Verbalized acceptance and understanding.  Screening  Tests Health Maintenance  Topic Date Due  . DEXA SCAN  Never done  . MAMMOGRAM  04/07/2019  . OPHTHALMOLOGY EXAM  04/27/2019  . HEMOGLOBIN A1C  03/13/2021  . FOOT EXAM  05/04/2021  . TETANUS/TDAP  06/01/2021  . COLONOSCOPY  05/25/2028  . INFLUENZA VACCINE  Completed  . COVID-19 Vaccine  Completed  . Hepatitis C Screening  Completed  . PNA vac Low Risk Adult  Completed    Health Maintenance  Health Maintenance Due  Topic Date Due  . DEXA SCAN  Never done  . MAMMOGRAM  04/07/2019  . OPHTHALMOLOGY EXAM  04/27/2019    Colorectal cancer screening: Type of screening: Colonoscopy. Completed 05/25/18. Repeat every 10 years  Mammogram status: Completed 04/06/18. Repeat every year  Bone Density status: Ordered today. Pt provided with contact info and advised to call to schedule appt.  Lung Cancer Screening: (Low Dose CT Chest recommended if Age 42-80 years, 30 pack-year currently smoking OR have quit w/in 15years.) does not qualify.   Additional Screening:  Hepatitis C Screening: does qualify; Completed 11/19/15  Vision Screening: Recommended annual ophthalmology exams for early detection of glaucoma and other disorders of the eye. Is the patient up to date with their annual eye exam?  No  Who is the provider or what is the name of the office in which the patient attends annual eye exams? [facility] in [place]: Recommended annual dental exams for proper oral hygiene  Community Resource Referral / Chronic Care Management: CRR required this visit?  No   CCM required this visit?  No      Plan:     I have personally reviewed and noted the following in the patient's chart:   . Medical and social history . Use of alcohol, tobacco or illicit drugs  . Current medications and supplements . Functional ability and  status . Nutritional status . Physical activity . Advanced directives . List of other physicians . Hospitalizations, surgeries, and ER visits in previous 12 months . Vitals . Screenings to include cognitive, depression, and falls . Referrals and appointments  In addition, I have reviewed and discussed with patient certain preventive protocols, quality metrics, and best practice recommendations. A written personalized care plan for preventive services as well as general preventive health recommendations were provided to patient.     Clemetine Marker, LPN   16/10/958   Nurse Notes: pt brought in sample of ozempic she was given at last appt to help with diabetes. She has not started it yet. Reviewed with patient on how to administer following package directions and instructions from Dr. Army Melia to take 0.25 mg x 4 weeks and 0.5 x 2 weeks. Pt also scheduled to follow up with [facility] pharmacist

## 2020-10-01 NOTE — Patient Instructions (Addendum)
Kimberly Salazar , Thank you for taking time to come for your Medicare Wellness Visit. I appreciate your ongoing commitment to your health goals. Please review the following plan we discussed and let me know if I can assist you in the future.   Screening recommendations/referrals: Colonoscopy: done 05/25/18 Mammogram: done 04/06/18; please schedule a mammogram at [facility] Bone Density: Please call [phone] to schedule your bone density screening.  Recommended yearly ophthalmology/optometry visit for glaucoma screening and checkup Recommended yearly dental visit for hygiene and checkup  Vaccinations: Influenza vaccine: done 09/13/20 Pneumococcal vaccine: done 09/15/14 Tdap vaccine: done 06/02/11 Shingles vaccine: Shingrix discussed. Please contact your pharmacy for coverage information.  Covid-19: done 12/22/19 7 01/19/20  Advanced directives: Please bring a copy of your health care power of attorney and living will to the office at your convenience once you have completed that paperwork  Conditions/risks identified: Recommend healthy eating and physical activity to lower A1c. Resources available at:  Www.diabetes.org  Www.cornerstones4care.com  Next appointment: Follow up in one year for your annual wellness visit    Preventive Care 65 Years and Older, Female Preventive care refers to lifestyle choices and visits with your health care provider that can promote health and wellness. What does preventive care include?  A yearly physical exam. This is also called an annual well check.  Dental exams once or twice a year.  Routine eye exams. Ask your health care provider how often you should have your eyes checked.  Personal lifestyle choices, including:  Daily care of your teeth and gums.  Regular physical activity.  Eating a healthy diet.  Avoiding tobacco and drug use.  Limiting alcohol use.  Practicing safe sex.  Taking low-dose aspirin every day.  Taking vitamin  and mineral supplements as recommended by your health care provider. What happens during an annual well check? The services and screenings done by your health care provider during your annual well check will depend on your age, overall health, lifestyle risk factors, and family history of disease. Counseling  Your health care provider may ask you questions about your:  Alcohol use.  Tobacco use.  Drug use.  Emotional well-being.  Home and relationship well-being.  Sexual activity.  Eating habits.  History of falls.  Memory and ability to understand (cognition).  Work and work Statistician.  Reproductive health. Screening  You may have the following tests or measurements:  Height, weight, and BMI.  Blood pressure.  Lipid and cholesterol levels. These may be checked every 5 years, or more frequently if you are over 48 years old.  Skin check.  Lung cancer screening. You may have this screening every year starting at age 15 if you have a 30-pack-year history of smoking and currently smoke or have quit within the past 15 years.  Fecal occult blood test (FOBT) of the stool. You may have this test every year starting at age 40.  Flexible sigmoidoscopy or colonoscopy. You may have a sigmoidoscopy every 5 years or a colonoscopy every 10 years starting at age 35.  Hepatitis C blood test.  Hepatitis B blood test.  Sexually transmitted disease (STD) testing.  Diabetes screening. This is done by checking your blood sugar (glucose) after you have not eaten for a while (fasting). You may have this done every 1-3 years.  Bone density scan. This is done to screen for osteoporosis. You may have this done starting at age 89.  Mammogram. This may be done every 1-2 years. Talk to your health care provider  about how often you should have regular mammograms. Talk with your health care provider about your test results, treatment options, and if necessary, the need for more tests.  Vaccines  Your health care provider may recommend certain vaccines, such as:  Influenza vaccine. This is recommended every year.  Tetanus, diphtheria, and acellular pertussis (Tdap, Td) vaccine. You may need a Td booster every 10 years.  Zoster vaccine. You may need this after age 25.  Pneumococcal 13-valent conjugate (PCV13) vaccine. One dose is recommended after age 49.  Pneumococcal polysaccharide (PPSV23) vaccine. One dose is recommended after age 30. Talk to your health care provider about which screenings and vaccines you need and how often you need them. This information is not intended to replace advice given to you by your health care provider. Make sure you discuss any questions you have with your health care provider. Document Released: 11/09/2015 Document Revised: 07/02/2016 Document Reviewed: 08/14/2015 Elsevier Interactive Patient Education  2017 [place] Prevention in the Home Falls can cause injuries. They can happen to people of all ages. There are many things you can do to make your home safe and to help prevent falls. What can I do on the outside of my home?  Regularly fix the edges of walkways and driveways and fix any cracks.  Remove anything that might make you trip as you walk through a door, such as a raised step or threshold.  Trim any bushes or trees on the path to your home.  Use bright outdoor lighting.  Clear any walking paths of anything that might make someone trip, such as rocks or tools.  Regularly check to see if handrails are loose or broken. Make sure that both sides of any steps have handrails.  Any raised decks and porches should have guardrails on the edges.  Have any leaves, snow, or ice cleared regularly.  Use sand or salt on walking paths during winter.  Clean up any spills in your garage right away. This includes oil or grease spills. What can I do in the bathroom?  Use night lights.  Install grab bars by the toilet  and in the tub and shower. Do not use towel bars as grab bars.  Use non-skid mats or decals in the tub or shower.  If you need to sit down in the shower, use a plastic, non-slip stool.  Keep the floor dry. Clean up any water that spills on the floor as soon as it happens.  Remove soap buildup in the tub or shower regularly.  Attach bath mats securely with double-sided non-slip rug tape.  Do not have throw rugs and other things on the floor that can make you trip. What can I do in the bedroom?  Use night lights.  Make sure that you have a light by your bed that is easy to reach.  Do not use any sheets or blankets that are too big for your bed. They should not hang down onto the floor.  Have a firm chair that has side arms. You can use this for support while you get dressed.  Do not have throw rugs and other things on the floor that can make you trip. What can I do in the kitchen?  Clean up any spills right away.  Avoid walking on wet floors.  Keep items that you use a lot in easy-to-reach places.  If you need to reach something above you, use a strong step stool that has a grab bar.  Keep electrical cords out of the way.  Do not use floor polish or wax that makes floors slippery. If you must use wax, use non-skid floor wax.  Do not have throw rugs and other things on the floor that can make you trip. What can I do with my stairs?  Do not leave any items on the stairs.  Make sure that there are handrails on both sides of the stairs and use them. Fix handrails that are broken or loose. Make sure that handrails are as long as the stairways.  Check any carpeting to make sure that it is firmly attached to the stairs. Fix any carpet that is loose or worn.  Avoid having throw rugs at the top or bottom of the stairs. If you do have throw rugs, attach them to the floor with carpet tape.  Make sure that you have a light switch at the top of the stairs and the bottom of the  stairs. If you do not have them, ask someone to add them for you. What else can I do to help prevent falls?  Wear shoes that:  Do not have high heels.  Have rubber bottoms.  Are comfortable and fit you well.  Are closed at the toe. Do not wear sandals.  If you use a stepladder:  Make sure that it is fully opened. Do not climb a closed stepladder.  Make sure that both sides of the stepladder are locked into place.  Ask someone to hold it for you, if possible.  Clearly mark and make sure that you can see:  Any grab bars or handrails.  First and last steps.  Where the edge of each step is.  Use tools that help you move around (mobility aids) if they are needed. These include:  Canes.  Walkers.  Scooters.  Crutches.  Turn on the lights when you go into a dark area. Replace any light bulbs as soon as they burn out.  Set up your furniture so you have a clear path. Avoid moving your furniture around.  If any of your floors are uneven, fix them.  If there are any pets around you, be aware of where they are.  Review your medicines with your doctor. Some medicines can make you feel dizzy. This can increase your chance of falling. Ask your doctor what other things that you can do to help prevent falls. This information is not intended to replace advice given to you by your health care provider. Make sure you discuss any questions you have with your health care provider. Document Released: 08/09/2009 Document Revised: 03/20/2016 Document Reviewed: 11/17/2014 Elsevier Interactive Patient Education  2017 [place].   Diabetes Mellitus and Nutrition, Adult When you have diabetes (diabetes mellitus), it is very important to have healthy eating habits because your blood sugar (glucose) levels are greatly affected by what you eat and drink. Eating healthy foods in the appropriate amounts, at about the same times every day, can help you:  Control your blood glucose.  Lower  your risk of heart disease.  Improve your blood pressure.  Reach or maintain a healthy weight. Every person with diabetes is different, and each person has different needs for a meal plan. Your health care provider may recommend that you work with a diet and nutrition specialist (dietitian) to make a meal plan that is best for you. Your meal plan may vary depending on factors such as:  The calories you need.  The medicines you take.  Your weight.  Your blood glucose, blood pressure, and cholesterol levels.  Your activity level.  Other health conditions you have, such as heart or kidney disease. How do carbohydrates affect me? Carbohydrates, also called carbs, affect your blood glucose level more than any other type of food. Eating carbs naturally raises the amount of glucose in your blood. Carb counting is a method for keeping track of how many carbs you eat. Counting carbs is important to keep your blood glucose at a healthy level, especially if you use insulin or take certain oral diabetes medicines. It is important to know how many carbs you can safely have in each meal. This is different for every person. Your dietitian can help you calculate how many carbs you should have at each meal and for each snack. Foods that contain carbs include:  Bread, cereal, rice, pasta, and crackers.  Potatoes and corn.  Peas, beans, and lentils.  Milk and yogurt.  Fruit and juice.  Desserts, such as cakes, cookies, ice cream, and candy. How does alcohol affect me? Alcohol can cause a sudden decrease in blood glucose (hypoglycemia), especially if you use insulin or take certain oral diabetes medicines. Hypoglycemia can be a life-threatening condition. Symptoms of hypoglycemia (sleepiness, dizziness, and confusion) are similar to symptoms of having too much alcohol. If your health care provider says that alcohol is safe for you, follow these guidelines:  Limit alcohol intake to no more than 1  drink per day for nonpregnant women and 2 drinks per day for men. One drink equals 12 oz of beer, 5 oz of wine, or 1 oz of hard liquor.  Do not drink on an empty stomach.  Keep yourself hydrated with water, diet soda, or unsweetened iced tea.  Keep in mind that regular soda, juice, and other mixers may contain a lot of sugar and must be counted as carbs. What are tips for following this plan?  Reading food labels  Start by checking the serving size on the "Nutrition Facts" label of packaged foods and drinks. The amount of calories, carbs, fats, and other nutrients listed on the label is based on one serving of the item. Many items contain more than one serving per package.  Check the total grams (g) of carbs in one serving. You can calculate the number of servings of carbs in one serving by dividing the total carbs by 15. For example, if a food has 30 g of total carbs, it would be equal to 2 servings of carbs.  Check the number of grams (g) of saturated and trans fats in one serving. Choose foods that have low or no amount of these fats.  Check the number of milligrams (mg) of salt (sodium) in one serving. Most people should limit total sodium intake to less than 2,300 mg per day.  Always check the nutrition information of foods labeled as "low-fat" or "nonfat". These foods may be higher in added sugar or refined carbs and should be avoided.  Talk to your dietitian to identify your daily goals for nutrients listed on the label. Shopping  Avoid buying canned, premade, or processed foods. These foods tend to be high in fat, sodium, and added sugar.  Shop around the outside edge of the grocery store. This includes fresh fruits and vegetables, bulk grains, fresh meats, and fresh dairy. Cooking  Use low-heat cooking methods, such as baking, instead of high-heat cooking methods like deep frying.  Cook using healthy oils, such as olive, canola, or sunflower oil.  Avoid cooking with butter,  cream, or high-fat meats. Meal planning  Eat meals and snacks regularly, preferably at the same times every day. Avoid going long periods of time without eating.  Eat foods high in fiber, such as fresh fruits, vegetables, beans, and whole grains. Talk to your dietitian about how many servings of carbs you can eat at each meal.  Eat 4-6 ounces (oz) of lean protein each day, such as lean meat, chicken, fish, eggs, or tofu. One oz of lean protein is equal to: ? 1 oz of meat, chicken, or fish. ? 1 egg. ?  cup of tofu.  Eat some foods each day that contain healthy fats, such as avocado, nuts, seeds, and fish. Lifestyle  Check your blood glucose regularly.  Exercise regularly as told by your health care provider. This may include: ? 150 minutes of moderate-intensity or vigorous-intensity exercise each week. This could be brisk walking, biking, or water aerobics. ? Stretching and doing strength exercises, such as yoga or weightlifting, at least 2 times a week.  Take medicines as told by your health care provider.  Do not use any products that contain nicotine or tobacco, such as cigarettes and e-cigarettes. If you need help quitting, ask your health care provider.  Work with a Social worker or diabetes educator to identify strategies to manage stress and any emotional and social challenges. Questions to ask a health care provider  Do I need to meet with a diabetes educator?  Do I need to meet with a dietitian?  What number can I call if I have questions?  When are the best times to check my blood glucose? Where to find more information:  American Diabetes Association: diabetes.org  Academy of Nutrition and Dietetics: www.eatright.[employer] of Diabetes and Digestive and Kidney Diseases (NIH): [employer] Summary  A healthy meal plan will help you control your blood glucose and maintain a healthy lifestyle.  Working with a diet and nutrition specialist  (dietitian) can help you make a meal plan that is best for you.  Keep in mind that carbohydrates (carbs) and alcohol have immediate effects on your blood glucose levels. It is important to count carbs and to use alcohol carefully. This information is not intended to replace advice given to you by your health care provider. Make sure you discuss any questions you have with your health care provider. Document Revised: 09/25/2017 Document Reviewed: 11/17/2016 Elsevier Patient Education  2020 [employer].

## 2020-10-05 ENCOUNTER — Telehealth: Payer: Self-pay | Admitting: Pharmacist

## 2020-10-05 NOTE — Chronic Care Management (AMB) (Addendum)
10/05/20- CPA attempted to outreach the patient to perform initial questions and remind the patient of appointment on 10/09/20 with PharmD Birdena Crandall. The patient did not answer. Left a HIPAA compliant voicemail for a call back.    10/08/2020-CPA made an unsuccessful outreach to the patient to perform initial questions and remind the patient of appointment on 10/09/20 with PharmD Birdena Crandall. No answer. Left a HIPAA compliant voicemail for a call back.   Raynelle Highland, [place] Assistant [phone]   CPP was able to complete phone outreach 10/09/20.  Junita Push. Kenton Kingfisher PharmD, [facility] [phone]

## 2020-10-09 ENCOUNTER — Ambulatory Visit: Payer: Medicare Other | Admitting: Pharmacist

## 2020-10-09 NOTE — Chronic Care Management (AMB) (Signed)
Chronic Care Management Pharmacy  Name: Kimberly Salazar  MRN: [id number] DOB: 1946/07/20  Chief Complaint/ HPI  Kimberly Salazar,  74 y.o. , female presents for their Initial CCM visit with the clinical pharmacist via telephone.  PCP : Glean Hess, MD  Their chronic conditions include: Hypertension, Hyperlipidemia, Diabetes and Cardiac pacemaker for sick sinus syndrome  Office Visits: 09/03/20- Dr. Army Melia -bloodwork, flu vaccine  Consult Visit: 08/10/20- Dr. Casper Harrison - Pacemaker interogation/ adjustment --brief RV lead noise  Medications: Outpatient Encounter Medications as of 10/09/2020  Medication Sig  . aspirin 81 MG chewable tablet Chew 1 tablet by mouth daily.  . cholecalciferol (VITAMIN D3) 25 MCG (1000 UT) tablet Take 1,000 Units by mouth daily.  Marland Kitchen glimepiride (AMARYL) 2 MG tablet TAKE 1 TABLET BY MOUTH TWICE DAILY WITH FOOD  . glucose blood (ONE TOUCH ULTRA TEST) test strip 1 each by Other route 2 (two) times daily.  Marland Kitchen JANUMET XR 50-1000 MG TB24 TAKE 2 TABLETS BY MOUTH DAILY  . metoprolol succinate (TOPROL-XL) 25 MG 24 hr tablet TAKE 1 TABLET BY MOUTH EVERY DAY  . Multiple Vitamins-Minerals (MULTIVITAL) tablet Take 1 tablet by mouth daily.  Marland Kitchen olmesartan-hydrochlorothiazide (BENICAR HCT) 20-12.5 MG tablet TAKE 1 TABLET BY MOUTH DAILY  . omega-3 acid ethyl esters (LOVAZA) 1 g capsule TAKE 4 CAPSULES BY MOUTH EVERY DAY  . simvastatin (ZOCOR) 80 MG tablet TAKE 1 TABLET(80 MG) BY MOUTH DAILY   No facility-administered encounter medications on file as of 10/09/2020.    Current Diagnosis/Assessment:    Goals Addressed            This Visit's Progress   . Pharmacy Care Plan       CARE PLAN ENTRY (see longitudinal plan of care for additional care plan information)  Current Barriers:  . Chronic Disease Management support, education, and care coordination needs related to Hypertension, Hyperlipidemia, Diabetes, and cardiac pacemaker   Hypertension BP Readings  from Last 3 Encounters:  10/01/20 138/72  09/13/20 139/70  05/04/20 136/64   . Pharmacist Clinical Goal(s): o Over the next 60 days, patient will work with PharmD and providers to achieve BP goal <130/80 . Current regimen:  . Olmesartan- hctz 20/12.5 mg qd . Metoprolol succinate 25 mg qd . Interventions: o Provided diet and exercise counseling. o Reviewed fill history o Reviewed proper BP technique . Patient self care activities - Over the next 60 days, patient will: o Check BP 2-3 times weekly, document, and provide at future appointments o Ensure daily salt intake < 2300 mg/day  Hyperlipidemia Lab Results  Component Value Date/Time   LDLCALC 44 05/04/2020 11:24 AM   LDLCALC 35 05/03/2019 08:44 AM   . Pharmacist Clinical Goal(s): o Over the next 60 days, patient will work with PharmD and providers to maintain LDL goal < 70 . Current regimen:  . Simvastatin 80 mg qd . Lovaza 4 gm qd . Aspirin 81 mg qd . Interventions: o Provided diet and exercise counseling. o Reviewed notes from cardiology o Reviewed signs/symptoms of bleeding . Patient self care activities - Over the next 60 days, patient will: o Focus on diet and increasing exercise  Diabetes Lab Results  Component Value Date/Time   HGBA1C 8.7 (H) 09/13/2020 09:50 AM   HGBA1C 8.2 (H) 05/04/2020 11:24 AM   . Pharmacist Clinical Goal(s): o Over the next 60 days, patient will work with PharmD and providers to achieve A1c goal <7% . Current regimen:  . Janumet XR 50-1000 mg 2 tabs daily .  Glimepiride 2 mg bid . Ozempic 0.5 mg q week . Interventions: o The patient is asked to make an attempt to improve diet and exercise patterns to aid in medical management of this problem. o Reviewed goal glucose readings for an A1c of <7%, we want to see fasting sugars <130 and 2 hour after meal sugars <180.  o Will initiate patient assistance for Trulicity . Patient self care activities - Over the next 60 days, patient  will: o Check blood sugar twice daily, document, and provide at future appointments o Contact provider with any episodes of hypoglycemia o Provide required portion of PAP documentation.   Medication management . Pharmacist Clinical Goal(s): o Over the next 60 days, patient will work with PharmD and providers to achieve optimal medication adherence . Current pharmacy: [facility] . Interventions o Comprehensive medication review performed. o Continue current medication management strategy . Patient self care activities - Over the next 60 days, patient will: o Focus on medication adherence by fill dates o Take medications as prescribed o Report any questions or concerns to PharmD and/or provider(s)  Initial goal documentation         Diabetes   A1c goal <7%  Recent Relevant Labs: Lab Results  Component Value Date/Time   HGBA1C 8.7 (H) 09/13/2020 09:50 AM   HGBA1C 8.2 (H) 05/04/2020 11:24 AM    Last diabetic Eye exam:  Lab Results  Component Value Date/Time   HMDIABEYEEXA No Retinopathy 06/02/2017 12:00 AM    BMP Latest Ref Rng & Units 09/13/2020 05/04/2020 01/10/2020  Glucose 65 - 99 mg/dL 254(H) 298(H) 305(H)  BUN 8 - 27 mg/dL 18 18 22   Creatinine 0.57 - 1.00 mg/dL 0.97 1.07(H) 1.32(H)  BUN/Creat Ratio 12 - 28 19 - 17  Sodium 134 - 144 mmol/L 139 137 136  Potassium 3.5 - 5.2 mmol/L 4.7 4.6 4.3  Chloride 96 - 106 mmol/L 102 103 96  CO2 20 - 29 mmol/L 22 22 21   Calcium 8.7 - 10.3 mg/dL 9.8 9.8 10.4(H)    Last diabetic Foot exam: No results found for: HMDIABFOOTEX   Checking BG: Weekly  Recent FBG Readings: 200-25 Recent HS BG readings:150-160 Last Microalbumin/creatinine ratio: 34 Feb 2018 Patient has failed these meds in past: Jardiance- persistent yeast infections Patient is currently query controlled on the following medications: . Janumet XR 50-1000 mg 2 tabs daily . Glimepiride 2 mg bid . Ozempic 0.5 mg q week  We discussed: diet and exercise extensively  and how to recognize and treat signs of hypoglycemia Reviewed goal glucose readings for an A1c of <7%, we want to see fasting sugars <130 and 2 hour after meal sugars <180. Encouraged patient to check BG at least 3-5 times weekly with a goal of twice daily. She states cooks most meals at home consistenting of vegetables, chicken , salmon and occasionally pork. She eats dinner around 8- 8:30 pm and occasionally has a snack of cheese and crackers or cookie before bed. She atrributes her A1C increase to eating too much Halloween candy. She walks weekly. She previously took Trulicity 3.74 mg /weekly for one month prior to going in the donut hole. She was given samples of Ozempic at last visit  and took her first dose of 0.80m on 10/07/20. She prefers the delivery mechanism of Trulicity to Ozempic. Pharmacy team will inititate patient assistance for Trulicity and Janumet XR  Plan :Continue current medications and control with diet and exercise. Initiate patient assistance for Trulicity and Janumet XR. Recommend microalbumin/urine  creatinine repeat.  Hypertension   BP goal is:  <130/80  Office blood pressures are  BP Readings from Last 3 Encounters:  10/01/20 138/72  09/13/20 139/70  05/04/20 136/64   Patient checks BP at home infrequently   Patient has failed these meds in the past: NA Patient is currently query  controlled on the following medications:  . Olmesartan- hctz 20/12.5 mg qd . Metoprolol succinate 25 mg qd  We discussed: Checking BP regularly at home and proper technique. Reviewed low-sodium diet and exercise benefit. She follows with Dr. [facility] for sick-sinus syndrome bradycardia Ocie Cornfield. Her generator was replaced in Sept. 2020. Original implanted 2010.  Plan  Continue current medications     Hyperlipidemia   LDL goal < 70  Lipid Panel     Component Value Date/Time   CHOL 129 05/04/2020 1124   CHOL 121 05/03/2019 0844   TRIG 170 (H) 05/04/2020 1124    HDL 51 05/04/2020 1124   HDL 48 05/03/2019 0844   LDLCALC 44 05/04/2020 1124   LDLCALC 35 05/03/2019 0844    Hepatic Function Latest Ref Rng & Units 05/04/2020 05/03/2019 07/26/2018  Total Protein 6.5 - 8.1 g/dL 7.4 6.5 7.2  Albumin 3.5 - 5.0 g/dL 4.1 4.5 4.8  AST 15 - 41 U/L 45(H) 28 38  ALT 0 - 44 U/L 41 28 38(H)  Alk Phosphatase 38 - 126 U/L 45 44 52  Total Bilirubin 0.3 - 1.2 mg/dL 0.9 0.6 0.7     The ASCVD Risk score ([place]., et al., 2013) failed to calculate for the following reasons:   The valid total cholesterol range is 130 to 320 mg/dL   Patient has failed these meds in past: NA Patient is currently controlled on the following medications:  . Simvastatin 80 mg qd . Lovaza 4 gm qd . Aspirin 81 mg qd  We discussed:  Patient is tolerating simvastatin well without complaint. We reviewed LDL values and goals . Reviewed dietary interventions for hypertriglyceridemia. Reviewed signs/symptoms of bleeding.  Plan  Continue current medications  Osteopenia / Osteoporosis   Last DEXA Scan: Never performed    No results found for: VD25OH   Patient has   Patient has failed these meds in past: NA Patient is currently query controlled on the following medications:  Marland Kitchen Multivitamin womens silver qd (~300 mg calcium) . Vitamin D 1000 iu qd  We discussed:  Recommend [phone] units of vitamin D daily. Recommend 1200 mg of calcium daily from dietary and supplemental sources. Recommend weight-bearing and muscle strengthening exercises for building and maintaining bone density. Encouraged patient to increase exercise to three days weekly with goal of 30 min 5 days/week. Recommend she have DEXA scan and increase calcium to recommened 1200 mg qd  Plan  Recommend calcium supplementation 500 mg bid and DEXA scan.  Vaccines   Reviewed and discussed patient's vaccination history.    Immunization History  Administered Date(s) Administered  . Fluad Quad(high Dose 65+) 09/08/2019,  09/13/2020  . Influenza, High Dose Seasonal PF 09/30/2018  . Influenza,inj,Quad PF,6+ Mos 11/19/2015, 07/15/2016  . Influenza-Unspecified 06/27/2017  . Moderna Sars-Covid-2 Vaccination 12/22/2019, 01/19/2020  . Pneumococcal Conjugate-13 09/15/2014  . Pneumococcal-Unspecified 06/02/2011  . Tdap 06/02/2011  . Zoster 10/27/2006    Plan  Recommended patient receive Shingrix  Vaccine. Encouraged patient to call her insurance for copay. Can receive at health department or request patient assistance if needed.  Medication Management   Pt uses [facility] for all medications Uses  pill box? Yes Pt endorses 95 % compliance  We discussed: Discussed benefits of medication synchronization, packaging and delivery as well as enhanced pharmacist oversight with [employer].  Plan  Continue current medication management strategy    Follow up: 2 month phone visit  Junita Push. Kenton Kingfisher PharmD, [facility] [phone]

## 2020-10-15 ENCOUNTER — Telehealth: Payer: Self-pay | Admitting: Pharmacist

## 2020-10-15 NOTE — Progress Notes (Signed)
  Chronic Care Management   Note  10/15/2020 Name: Makell Drohan MRN: [id number] DOB: [birth year]  Patient assistance application started and sent to PharmD for completion. Waiting for further directions from PharmD.   Cloretta Ned, LPN Clinical Pharmacist Assistant  [phone]    Follow Up:  Pending directions from PharmD

## 2020-10-28 NOTE — Patient Instructions (Addendum)
Visit Information  It was a pleasure speaking with you today! Thank you for letting me be a part of your care team. Please call with any questions or concerns.  Goals Addressed            This Visit's Progress   . Pharmacy Care Plan       CARE PLAN ENTRY (see longitudinal plan of care for additional care plan information)  Current Barriers:  . Chronic Disease Management support, education, and care coordination needs related to Hypertension, Hyperlipidemia, Diabetes, and cardiac pacemaker   Hypertension BP Readings from Last 3 Encounters:  10/01/20 138/72  09/13/20 139/70  05/04/20 136/64   . Pharmacist Clinical Goal(s): o Over the next 60 days, patient will work with PharmD and providers to achieve BP goal <130/80 . Current regimen:  . Olmesartan- hctz 20/12.5 mg qd . Metoprolol succinate 25 mg qd . Interventions: o Provided diet and exercise counseling. o Reviewed fill history o Reviewed proper BP technique . Patient self care activities - Over the next 60 days, patient will: o Check BP 2-3 times weekly, document, and provide at future appointments o Ensure daily salt intake < 2300 mg/day  Hyperlipidemia Lab Results  Component Value Date/Time   LDLCALC 44 05/04/2020 11:24 AM   LDLCALC 35 05/03/2019 08:44 AM   . Pharmacist Clinical Goal(s): o Over the next 60 days, patient will work with PharmD and providers to maintain LDL goal < 70 . Current regimen:  . Simvastatin 80 mg qd . Lovaza 4 gm qd . Aspirin 81 mg qd . Interventions: o Provided diet and exercise counseling. o Reviewed notes from cardiology o Reviewed signs/symptoms of bleeding . Patient self care activities - Over the next 60 days, patient will: o Focus on diet and increasing exercise  Diabetes Lab Results  Component Value Date/Time   HGBA1C 8.7 (H) 09/13/2020 09:50 AM   HGBA1C 8.2 (H) 05/04/2020 11:24 AM   . Pharmacist Clinical Goal(s): o Over the next 60 days, patient will work with PharmD  and providers to achieve A1c goal <7% . Current regimen:  . Janumet XR 50-1000 mg 2 tabs daily . Glimepiride 2 mg bid . Ozempic 0.5 mg q week . Interventions: o The patient is asked to make an attempt to improve diet and exercise patterns to aid in medical management of this problem. o Reviewed goal glucose readings for an A1c of <7%, we want to see fasting sugars <130 and 2 hour after meal sugars <180.  o Will initiate patient assistance for Trulicity . Patient self care activities - Over the next 60 days, patient will: o Check blood sugar twice daily, document, and provide at future appointments o Contact provider with any episodes of hypoglycemia o Provide required portion of PAP documentation.   Medication management . Pharmacist Clinical Goal(s): o Over the next 60 days, patient will work with PharmD and providers to achieve optimal medication adherence . Current pharmacy: [facility] . Interventions o Comprehensive medication review performed. o Continue current medication management strategy . Patient self care activities - Over the next 60 days, patient will: o Focus on medication adherence by fill dates o Take medications as prescribed o Report any questions or concerns to PharmD and/or provider(s)  Initial goal documentation        Ms. Amstutz was given information about Chronic Care Management services today including:  1. CCM service includes personalized support from designated clinical staff supervised by her physician, including individualized plan of care and coordination with  other care providers 2. 24/7 contact phone numbers for assistance for urgent and routine care needs. 3. Standard insurance, coinsurance, copays and deductibles apply for chronic care management only during months in which we provide at least 20 minutes of these services. Most insurances cover these services at 100%, however patients may be responsible for any copay, coinsurance and/or  deductible if applicable. This service may help you avoid the need for more expensive face-to-face services. 4. Only one practitioner may furnish and bill the service in a calendar month. 5. The patient may stop CCM services at any time (effective at the end of the month) by phone call to the office staff.  Patient agreed to services and verbal consent obtained.   The patient verbalized understanding of instructions, educational materials, and care plan provided today and agreed to receive a mailed copy of patient instructions, educational materials, and care plan.  Telephone follow up appointment with pharmacy team member scheduled for: 2 months  Junita Push. Marlaina Coburn PharmD, BCPS Clinical Pharmacist [phone]  Diabetes Mellitus and Nutrition, Adult When you have diabetes (diabetes mellitus), it is very important to have healthy eating habits because your blood sugar (glucose) levels are greatly affected by what you eat and drink. Eating healthy foods in the appropriate amounts, at about the same times every day, can help you:  Control your blood glucose.  Lower your risk of heart disease.  Improve your blood pressure.  Reach or maintain a healthy weight. Every person with diabetes is different, and each person has different needs for a meal plan. Your health care provider may recommend that you work with a diet and nutrition specialist (dietitian) to make a meal plan that is best for you. Your meal plan may vary depending on factors such as:  The calories you need.  The medicines you take.  Your weight.  Your blood glucose, blood pressure, and cholesterol levels.  Your activity level.  Other health conditions you have, such as heart or kidney disease. How do carbohydrates affect me? Carbohydrates, also called carbs, affect your blood glucose level more than any other type of food. Eating carbs naturally raises the amount of glucose in your blood. Carb counting is a method for  keeping track of how many carbs you eat. Counting carbs is important to keep your blood glucose at a healthy level, especially if you use insulin or take certain oral diabetes medicines. It is important to know how many carbs you can safely have in each meal. This is different for every person. Your dietitian can help you calculate how many carbs you should have at each meal and for each snack. Foods that contain carbs include:  Bread, cereal, rice, pasta, and crackers.  Potatoes and corn.  Peas, beans, and lentils.  Milk and yogurt.  Fruit and juice.  Desserts, such as cakes, cookies, ice cream, and candy. How does alcohol affect me? Alcohol can cause a sudden decrease in blood glucose (hypoglycemia), especially if you use insulin or take certain oral diabetes medicines. Hypoglycemia can be a life-threatening condition. Symptoms of hypoglycemia (sleepiness, dizziness, and confusion) are similar to symptoms of having too much alcohol. If your health care provider says that alcohol is safe for you, follow these guidelines:  Limit alcohol intake to no more than 1 drink per day for nonpregnant women and 2 drinks per day for men. One drink equals 12 oz of beer, 5 oz of wine, or 1 oz of hard liquor.  Do not drink on an  empty stomach.  Keep yourself hydrated with water, diet soda, or unsweetened iced tea.  Keep in mind that regular soda, juice, and other mixers may contain a lot of sugar and must be counted as carbs. What are tips for following this plan?  Reading food labels  Start by checking the serving size on the "Nutrition Facts" label of packaged foods and drinks. The amount of calories, carbs, fats, and other nutrients listed on the label is based on one serving of the item. Many items contain more than one serving per package.  Check the total grams (g) of carbs in one serving. You can calculate the number of servings of carbs in one serving by dividing the total carbs by 15. For  example, if a food has 30 g of total carbs, it would be equal to 2 servings of carbs.  Check the number of grams (g) of saturated and trans fats in one serving. Choose foods that have low or no amount of these fats.  Check the number of milligrams (mg) of salt (sodium) in one serving. Most people should limit total sodium intake to less than 2,300 mg per day.  Always check the nutrition information of foods labeled as "low-fat" or "nonfat". These foods may be higher in added sugar or refined carbs and should be avoided.  Talk to your dietitian to identify your daily goals for nutrients listed on the label. Shopping  Avoid buying canned, premade, or processed foods. These foods tend to be high in fat, sodium, and added sugar.  Shop around the outside edge of the grocery store. This includes fresh fruits and vegetables, bulk grains, fresh meats, and fresh dairy. Cooking  Use low-heat cooking methods, such as baking, instead of high-heat cooking methods like deep frying.  Cook using healthy oils, such as olive, canola, or sunflower oil.  Avoid cooking with butter, cream, or high-fat meats. Meal planning  Eat meals and snacks regularly, preferably at the same times every day. Avoid going long periods of time without eating.  Eat foods high in fiber, such as fresh fruits, vegetables, beans, and whole grains. Talk to your dietitian about how many servings of carbs you can eat at each meal.  Eat 4-6 ounces (oz) of lean protein each day, such as lean meat, chicken, fish, eggs, or tofu. One oz of lean protein is equal to: ? 1 oz of meat, chicken, or fish. ? 1 egg. ?  cup of tofu.  Eat some foods each day that contain healthy fats, such as avocado, nuts, seeds, and fish. Lifestyle  Check your blood glucose regularly.  Exercise regularly as told by your health care provider. This may include: ? 150 minutes of moderate-intensity or vigorous-intensity exercise each week. This could be brisk  walking, biking, or water aerobics. ? Stretching and doing strength exercises, such as yoga or weightlifting, at least 2 times a week.  Take medicines as told by your health care provider.  Do not use any products that contain nicotine or tobacco, such as cigarettes and e-cigarettes. If you need help quitting, ask your health care provider.  Work with a Social worker or diabetes educator to identify strategies to manage stress and any emotional and social challenges. Questions to ask a health care provider  Do I need to meet with a diabetes educator?  Do I need to meet with a dietitian?  What number can I call if I have questions?  When are the best times to check my blood glucose? Where  to find more information:  American Diabetes Association: diabetes.org  Academy of Nutrition and Dietetics: www.eatright.[employer] of Diabetes and Digestive and Kidney Diseases (NIH): CarFlippers.tn Summary  A healthy meal plan will help you control your blood glucose and maintain a healthy lifestyle.  Working with a diet and nutrition specialist (dietitian) can help you make a meal plan that is best for you.  Keep in mind that carbohydrates (carbs) and alcohol have immediate effects on your blood glucose levels. It is important to count carbs and to use alcohol carefully. This information is not intended to replace advice given to you by your health care provider. Make sure you discuss any questions you have with your health care provider. Document Revised: 09/25/2017 Document Reviewed: 11/17/2016 Elsevier Patient Education  2020 [employer].

## 2020-11-12 ENCOUNTER — Telehealth: Payer: Self-pay | Admitting: Pharmacist

## 2020-11-13 ENCOUNTER — Telehealth: Payer: Self-pay | Admitting: Pharmacist

## 2020-11-17 ENCOUNTER — Other Ambulatory Visit: Payer: Self-pay | Admitting: Internal Medicine

## 2020-11-17 NOTE — Telephone Encounter (Signed)
Requested medication (s) are due for refill today: Yes  Requested medication (s) are on the active medication list: Yes  Last refill:  11/16/19  Future visit scheduled: Yes  Notes to clinic:  Unable to refill per protocol, expired Rx     Requested Prescriptions  Pending Prescriptions Disp Refills   JANUMET XR 50-1000 MG TB24 [Pharmacy Med Name: JANUMET XR 50MG/1000MG TABLETS] 60 tablet 12    Sig: TAKE 2 TABLETS BY MOUTH DAILY      Endocrinology:  Diabetes - Biguanide + DPP-4 Inhibitor Combos Failed - 11/17/2020  6:15 AM      Failed - HBA1C is between 0 and 7.9 and within 180 days    Hgb A1c MFr Bld  Date Value Ref Range Status  09/13/2020 8.7 (H) 4.8 - 5.6 % Final    Comment:             Prediabetes: 5.7 - 6.4          Diabetes: >6.4          Glycemic control for adults with diabetes: <7.0           Failed - eGFR in normal range and within 360 days    GFR calc Af Amer  Date Value Ref Range Status  09/13/2020 66 >59 mL/min/1.73 Final    Comment:    **In accordance with recommendations from the NKF-ASN Task force,**   [facility] is in the process of updating its eGFR calculation to the   2021 CKD-EPI creatinine equation that estimates kidney function   without a race variable.    GFR calc non Af Amer  Date Value Ref Range Status  09/13/2020 57 (L) >59 mL/min/1.73 Final          Passed - Cr in normal range and within 360 days    Creatinine, Ser  Date Value Ref Range Status  09/13/2020 0.97 0.57 - 1.00 mg/dL Final          Passed - Valid encounter within last 6 months    Recent Outpatient Visits           2 months ago Type II diabetes mellitus with complication [facility])   [facility] Glean Hess, MD   6 months ago Essential (primary) hypertension   [facility] Glean Hess, MD   9 months ago Type II diabetes mellitus with complication [facility])   [facility] Glean Hess, MD   10 months ago Type II diabetes mellitus with  complication [facility])   [facility] Glean Hess, MD   1 year ago Type II diabetes mellitus with complication [facility])   [facility] Glean Hess, MD       Future Appointments             In 1 month Army Melia Jesse Sans, MD [facility], [place]   In 5 months Army Melia, Jesse Sans, MD [facility], [facility]

## 2020-11-20 ENCOUNTER — Other Ambulatory Visit: Payer: Self-pay | Admitting: Internal Medicine

## 2020-11-20 ENCOUNTER — Telehealth: Payer: Self-pay | Admitting: Internal Medicine

## 2020-11-20 DIAGNOSIS — E118 Type 2 diabetes mellitus with unspecified complications: Secondary | ICD-10-CM

## 2020-11-20 MED ORDER — METFORMIN HCL ER 500 MG PO TB24: 1000.0000 mg | ORAL_TABLET | Freq: Two times a day (BID) | ORAL | 5 refills | Status: DC

## 2020-11-20 NOTE — Telephone Encounter (Signed)
Pt is calling and was given samples of ozempic. Pt had 2 samples of .5 and 4 samples of .25. Pt states julie pharmacist was going to talk with dr berglund concerning janumet has some on the same ingredient as ozempic. Pt would like more samples . [facility] [place] [id number]

## 2020-11-20 NOTE — Telephone Encounter (Signed)
Please call pt to discuss. Dr. Army Melia is out of the office she will be back 1/31.  Thank you,  KP

## 2020-11-20 NOTE — Telephone Encounter (Signed)
Spoke with patient today. She would like to remain on Ozempic instead of pursuing patient assistance for Trulicity. CPA will mail PAP application for Ozempic to patient today. Will discuss Janumet vs. Metformin alone with Dr. Army Melia when she returns. Message sent today. Will follow up with office staff to see if Ozempic samiples are available.

## 2020-11-21 ENCOUNTER — Other Ambulatory Visit: Payer: Self-pay

## 2020-11-21 ENCOUNTER — Telehealth: Payer: Self-pay

## 2020-11-21 ENCOUNTER — Other Ambulatory Visit: Payer: Self-pay | Admitting: Internal Medicine

## 2020-11-21 MED ORDER — OZEMPIC (0.25 OR 0.5 MG/DOSE) 2 MG/1.5ML ~~LOC~~ SOPN: 0.5000 mg | PEN_INJECTOR | SUBCUTANEOUS | 5 refills | Status: DC

## 2020-11-21 NOTE — Telephone Encounter (Signed)
-----   Message from Vladimir Faster, [facility] sent at 11/20/2020  5:02 PM EST ----- Thank you. Would you like me to let her know?  Can you please send a prescription for Ozempic while waiting on patient assistance approval? She took her last dose last week and we are out of samples. Thank You! ----- Message ----- From: Clista Bernhardt, CMA Sent: 11/20/2020   4:18 PM EST To: Vladimir Faster, [facility]  Please see below message from Dr Army Melia for patient about medication.   ----- Message ----- From: Glean Hess, MD Sent: 11/20/2020   1:59 PM EST To: Clista Bernhardt, CMA  Will you let Ms. Kitzmiller know that she should finish out her current suppl of Janumet and then begin meformin alone along with the Ozempic? ----- Message ----- From: Vladimir Faster, [facility] Sent: 11/20/2020  12:47 PM EST To: Glean Hess, MD  Mrs. Baggett has decided she would prefer to stay on Ozempic instead of Trulicity. Patient assistance application will be redone and mailed to her today.  She isn't checking BG daily but her readings do seem to be decreasing.  Would you consider changing Janumet XR to Metformin XR alone? She likely isn't getting any additional benefit from the DPP-4 component now that she is consistently taking the GLP-1.  Thank you!

## 2020-11-21 NOTE — Progress Notes (Signed)
    Chronic Care Management Pharmacy Assistant   Name: Kimberly Salazar  MRN: [id number] DOB: Aug 21, 1946  Reason for Encounter: Disease State Review of Diabetes.   PCP : Glean Hess, MD  Allergies:  No Known Allergies  Medications: Outpatient Encounter Medications as of 11/12/2020  Medication Sig  . aspirin 81 MG chewable tablet Chew 1 tablet by mouth daily.  . cholecalciferol (VITAMIN D3) 25 MCG (1000 UT) tablet Take 1,000 Units by mouth daily.  Marland Kitchen glimepiride (AMARYL) 2 MG tablet TAKE 1 TABLET BY MOUTH TWICE DAILY WITH FOOD  . glucose blood (ONE TOUCH ULTRA TEST) test strip 1 each by Other route 2 (two) times daily.  . metoprolol succinate (TOPROL-XL) 25 MG 24 hr tablet TAKE 1 TABLET BY MOUTH EVERY DAY  . Multiple Vitamins-Minerals (MULTIVITAL) tablet Take 1 tablet by mouth daily.  Marland Kitchen olmesartan-hydrochlorothiazide (BENICAR HCT) 20-12.5 MG tablet TAKE 1 TABLET BY MOUTH DAILY  . omega-3 acid ethyl esters (LOVAZA) 1 g capsule TAKE 4 CAPSULES BY MOUTH EVERY DAY  . simvastatin (ZOCOR) 80 MG tablet TAKE 1 TABLET(80 MG) BY MOUTH DAILY  . [DISCONTINUED] JANUMET XR 50-1000 MG TB24 TAKE 2 TABLETS BY MOUTH DAILY   No facility-administered encounter medications on file as of 11/12/2020.    Current Diagnosis: Patient Active Problem List   Diagnosis Date Noted  . Ovarian failure 04/09/2017  . Basal cell carcinoma, leg 07/15/2016  . Abnormal finding on thallium stress test 05/17/2015  . Hyperlipidemia associated with type 2 diabetes mellitus ([place]) 04/12/2015  . Essential (primary) hypertension 04/12/2015  . History of uterine cancer 04/12/2015  . Type II diabetes mellitus with complication ([place]) 62/13/0865  . Cardiac pacemaker in situ 10/11/2009    11-12-2020: Reviewed the chart related to the patient's diabetes. Reached out to the patient to perform a follow-up call related to her diabetes and blood pressure. No answer. Left HIPAA compliant voicemail requesting a callback.    11-14-2020: Second attempt to contact the patient. Again no answer. Left message requesting a callback.   11-22-20: Third attempt to speak with the patient related to her diabetes and blood pressure. Attempting to follow up related to the instructions of checking her blood glucose 3-5 times a week, blood pressure 1-2 times a week, and walking 10 minutes daily. No answer at listed numbers but did leave a HIPAA compliant voicemail requesting a callback. PharmD Birdena Crandall was made aware of the failed attempts to speak with the patient.   Cloretta Ned, LPN Clinical Pharmacist Assistant  [phone]   Follow-Up:  Scheduled Follow-Up With Clinical Pharmacist on 12-11-20

## 2020-11-21 NOTE — Telephone Encounter (Signed)
Called and spoke with patient informing her to complete her current supply of Janumet and then start on metformin and Ozempic alone. Sent in [place] 0.5 to patients pharmacy. Told patient to come pick up 2 boxes of Ozempic samples.   She verbalized understanding of this information.

## 2020-11-21 NOTE — Progress Notes (Addendum)
    Chronic Care Management Pharmacy Assistant   Name: Kimberly Salazar  MRN: [id number] DOB: [birth year]  Reason for Encounter: Patient Assistance Applications  PCP : Glean Hess, MD  Allergies:  No Known Allergies  Medications: Outpatient Encounter Medications as of 11/13/2020  Medication Sig   aspirin 81 MG chewable tablet Chew 1 tablet by mouth daily.   cholecalciferol (VITAMIN D3) 25 MCG (1000 UT) tablet Take 1,000 Units by mouth daily.   glimepiride (AMARYL) 2 MG tablet TAKE 1 TABLET BY MOUTH TWICE DAILY WITH FOOD   glucose blood (ONE TOUCH ULTRA TEST) test strip 1 each by Other route 2 (two) times daily.   metoprolol succinate (TOPROL-XL) 25 MG 24 hr tablet TAKE 1 TABLET BY MOUTH EVERY DAY   Multiple Vitamins-Minerals (MULTIVITAL) tablet Take 1 tablet by mouth daily.   olmesartan-hydrochlorothiazide (BENICAR HCT) 20-12.5 MG tablet TAKE 1 TABLET BY MOUTH DAILY   omega-3 acid ethyl esters (LOVAZA) 1 g capsule TAKE 4 CAPSULES BY MOUTH EVERY DAY   simvastatin (ZOCOR) 80 MG tablet TAKE 1 TABLET(80 MG) BY MOUTH DAILY   [DISCONTINUED] JANUMET XR 50-1000 MG TB24 TAKE 2 TABLETS BY MOUTH DAILY   No facility-administered encounter medications on file as of 11/13/2020.    Current Diagnosis: Patient Active Problem List   Diagnosis Date Noted   Ovarian failure 04/09/2017   Basal cell carcinoma, leg 07/15/2016   Abnormal finding on thallium stress test 05/17/2015   Hyperlipidemia associated with type 2 diabetes mellitus ([place]) 04/12/2015   Essential (primary) hypertension 04/12/2015   History of uterine cancer 04/12/2015   Type II diabetes mellitus with complication ([place]) 62/37/6283   Cardiac pacemaker in situ 10/11/2009   11-13-2020: Patient assistance application for Trulicity prefilled.  11-20-2020: Patient assistance application prefilled for Janumet XR. Printed both patient assistance applications Trulicity and Janumet XR. Highlighted and flagged areas where the patient was  needing to complete, provided self addressed envelope if she decided to mail the application back and provided typed instructions on how to complete the application and other documents that were being requested by the program.   11-20-20 at 12:25pm. PharmD Birdena Crandall reached out explaining the patient called and explained she does not want her medications to be changed to Trulicity, but prefers to continue Ozempic. Removed Trulicity application. Prefilled Ozempic application. Prepped the application as above. Mailed patient assistance applications for Ozempic and Janumet XR.   Cloretta Ned, LPN Clinical Pharmacist Assistant  [phone]  I have reviewed the care management and care coordination activities outlined in this encounter and I am certifying that I agree with the content of this note.   Follow-Up:  Pharmacist Review  11/11/20- PCP okay with changing Janumet to metformin alone. Will hold Janumet PAP.  Junita Push. Kenton Kingfisher PharmD, [facility] [phone]   .

## 2020-12-11 ENCOUNTER — Ambulatory Visit (INDEPENDENT_AMBULATORY_CARE_PROVIDER_SITE_OTHER): Payer: Medicare Other | Admitting: Pharmacist

## 2020-12-11 DIAGNOSIS — E118 Type 2 diabetes mellitus with unspecified complications: Secondary | ICD-10-CM

## 2020-12-11 DIAGNOSIS — E785 Hyperlipidemia, unspecified: Secondary | ICD-10-CM

## 2020-12-11 DIAGNOSIS — E1169 Type 2 diabetes mellitus with other specified complication: Secondary | ICD-10-CM | POA: Diagnosis not present

## 2020-12-11 DIAGNOSIS — I10 Essential (primary) hypertension: Secondary | ICD-10-CM

## 2020-12-11 NOTE — Progress Notes (Signed)
Chronic Care Management Pharmacy Note  12/18/2020 Name:  Kimberly Salazar MRN:  [id number] DOB:  1946/05/15  Subjective: Kimberly Salazar is an 75 y.o. year old female who is a primary patient of Army Melia, Jesse Sans, MD.  The CCM team was consulted for assistance with disease management and care coordination needs.    Engaged with patient by telephone for follow up visit in response to provider referral for pharmacy case management and/or care coordination services.   Consent to Services:  The patient was given information about Chronic Care Management services, agreed to services, and gave verbal consent prior to initiation of services.  Please see initial visit note for detailed documentation.   Patient Care Team: Glean Hess, MD as PCP - General (Internal Medicine) Luana Shu, DO as Referring Physician (Cardiology) Edwyna Ready, MD as Referring Physician (Cardiology) Criss Alvine, MD as Referring Physician (Dermatology) [facility] (Optometry) Vladimir Faster, [facility] (Pharmacist)   Recent consult visits: 11/09/20- Dr. Casper Harrison, cardiology- pacemaker fitting and adjustment  Hospital visits: None in previous 6 months  Objective:  Lab Results  Component Value Date   CREATININE 0.97 09/13/2020   BUN 18 09/13/2020   GFRNONAA 57 (L) 09/13/2020   GFRAA 66 09/13/2020   NA 139 09/13/2020   K 4.7 09/13/2020   CALCIUM 9.8 09/13/2020   CO2 22 09/13/2020    Lab Results  Component Value Date/Time   HGBA1C 8.7 (H) 09/13/2020 09:50 AM   HGBA1C 8.2 (H) 05/04/2020 11:24 AM    Last diabetic Eye exam:  Lab Results  Component Value Date/Time   HMDIABEYEEXA No Retinopathy 06/02/2017 12:00 AM    Last diabetic Foot exam: No results found for: HMDIABFOOTEX   Lab Results  Component Value Date   CHOL 129 05/04/2020   HDL 51 05/04/2020   LDLCALC 44 05/04/2020   TRIG 170 (H) 05/04/2020   CHOLHDL 2.5 05/04/2020    Hepatic Function Latest Ref Rng & Units  05/04/2020 05/03/2019 07/26/2018  Total Protein 6.5 - 8.1 g/dL 7.4 6.5 7.2  Albumin 3.5 - 5.0 g/dL 4.1 4.5 4.8  AST 15 - 41 U/L 45(H) 28 38  ALT 0 - 44 U/L 41 28 38(H)  Alk Phosphatase 38 - 126 U/L 45 44 52  Total Bilirubin 0.3 - 1.2 mg/dL 0.9 0.6 0.7    Lab Results  Component Value Date/Time   TSH 2.600 05/04/2020 11:24 AM   TSH 3.290 05/03/2019 08:44 AM   TSH 2.820 03/23/2018 09:06 AM    CBC Latest Ref Rng & Units 05/04/2020 05/03/2019 03/23/2018  WBC 4.0 - 10.5 K/uL 7.7 6.6 6.6  Hemoglobin 12.0 - 15.0 g/dL 14.1 13.3 14.5  Hematocrit 36.0 - 46.0 % 39.3 38.9 43.0  Platelets 150 - 400 K/uL 172 157 195    No results found for: VD25OH  Clinical ASCVD: No  The ASCVD Risk score Mikey Bussing DC Jr., et al., 2013) failed to calculate for the following reasons:   The valid total cholesterol range is 130 to 320 mg/dL    Depression screen [facility] 2/9 10/01/2020 09/13/2020 05/04/2020  Decreased Interest 0 0 0  Down, Depressed, Hopeless 0 0 0  PHQ - 2 Score 0 0 0  Altered sleeping - 0 0  Tired, decreased energy - 0 0  Change in appetite - 0 0  Feeling bad or failure about yourself  - 0 0  Trouble concentrating - 0 0  Moving slowly or fidgety/restless - 0 0  Suicidal thoughts - 0  0  PHQ-9 Score - 0 0  Difficult doing work/chores - Not difficult at all Not difficult at all    CHA2DS2-VASc Score = 5  The patient's score is based upon: CHF History: No HTN History: Yes Diabetes History: Yes Stroke History: No Vascular Disease History: No Age Score: 2 Gender Score: 1  {  Social History   Tobacco Use  Smoking Status Never Smoker  Smokeless Tobacco Never Used   BP Readings from Last 3 Encounters:  10/01/20 138/72  09/13/20 139/70  05/04/20 136/64   Pulse Readings from Last 3 Encounters:  10/01/20 80  09/13/20 66  05/04/20 67   Wt Readings from Last 3 Encounters:  10/01/20 198 lb 3.2 oz (89.9 kg)  09/13/20 198 lb (89.8 kg)  05/04/20 197 lb (89.4 kg)    Assessment/Interventions: Review  of patient past medical history, allergies, medications, health status, including review of consultants reports, laboratory and other test data, was performed as part of comprehensive evaluation and provision of chronic care management services.   SDOH:  (Social Determinants of Health) assessments and interventions performed: Yes SDOH Interventions   Flowsheet Row Most Recent Value  SDOH Interventions   Financial Strain Interventions Other (Comment)  [patient assistance for Ozempic. Changed therapy]      CCM Care Plan  No Known Allergies  Medications Reviewed Today    Reviewed by Vladimir Faster, [facility] (Pharmacist) on 10/09/20 at 1257  Med List Status: <None>  Medication Order Taking? Sig Documenting Provider Last Dose Status Informant  aspirin 81 MG chewable tablet [id number] Yes Chew 1 tablet by mouth daily. [provider] Taking Active            Med Note Gloris Ham, [place] [facility]   Wed Sep 28, 2019  3:27 PM)    cholecalciferol (VITAMIN D3) 25 MCG (1000 UT) tablet [id number] Yes Take 1,000 Units by mouth daily. [provider] Taking Active   glimepiride (AMARYL) 2 MG tablet [id number] Yes TAKE 1 TABLET BY MOUTH TWICE DAILY WITH FOOD Glean Hess, MD Taking Active   glucose blood (ONE TOUCH ULTRA TEST) test strip [id number] Yes 1 each by Other route 2 (two) times daily. Glean Hess, MD Taking Active   JANUMET XR 50-1000 [place] TB24 [id number] Yes TAKE 2 TABLETS BY MOUTH DAILY Glean Hess, MD Taking Active   metoprolol succinate (TOPROL-XL) 25 MG 24 hr tablet [id number] Yes TAKE 1 TABLET BY MOUTH EVERY DAY Glean Hess, MD Taking Active   Multiple Vitamins-Minerals (MULTIVITAL) tablet [id number] Yes Take 1 tablet by mouth daily. [provider] Taking Active            Med Note Clemetine Marker D   Wed Sep 28, 2019  3:27 PM)    olmesartan-hydrochlorothiazide (BENICAR HCT) 20-12.5 MG tablet [id number] Yes TAKE 1 TABLET BY MOUTH DAILY Glean Hess, MD Taking  Active   omega-3 acid ethyl esters (LOVAZA) 1 g capsule [id number] Yes TAKE 4 CAPSULES BY MOUTH EVERY DAY Glean Hess, MD Taking Active   simvastatin (ZOCOR) 80 MG tablet [id number] Yes TAKE 1 TABLET(80 MG) BY MOUTH DAILY Glean Hess, MD Taking Active           Patient Active Problem List   Diagnosis Date Noted  . Ovarian failure 04/09/2017  . Basal cell carcinoma, leg 07/15/2016  . Abnormal finding on thallium stress test 05/17/2015  . Hyperlipidemia associated with type 2 diabetes mellitus ([place]) 04/12/2015  . Essential (primary) hypertension 04/12/2015  .  History of uterine cancer 04/12/2015  . Type II diabetes mellitus with complication ([place]) 16/10/930  . Cardiac pacemaker in situ 10/11/2009    Immunization History  Administered Date(s) Administered  . Fluad Quad(high Dose 65+) 09/08/2019, 09/13/2020  . Influenza, High Dose Seasonal PF 09/30/2018  . Influenza,inj,Quad PF,6+ Mos 11/19/2015, 07/15/2016  . Influenza-Unspecified 06/27/2017  . Moderna Sars-Covid-2 Vaccination 12/22/2019, 01/19/2020  . Pneumococcal Conjugate-13 09/15/2014  . Pneumococcal-Unspecified 06/02/2011  . Tdap 06/02/2011  . Zoster 10/27/2006    Conditions to be addressed/monitored:  Hypertension, Hyperlipidemia, Diabetes and Atrial Fibrillation  Care Plan : [place] plan  Updates made by Vladimir Faster, RPH since 12/18/2020 12:00 AM    Problem: HTN, HLD, Diabetes,   Priority: High    Goal: Disease management   Start Date: 12/11/2020  This Visit's Progress: On track  Priority: High  Note:   Current Barriers:  . Unable to independently afford treatment regimen . Unable to achieve control of diabetes  . Unable to independently manage disease state  Pharmacist Clinical Goal(s):  Marland Kitchen Over the next 60 days, patient will verbalize ability to afford treatment regimen . achieve adherence to monitoring guidelines and medication adherence to achieve therapeutic efficacy . achieve  control of diabetes as evidenced by A1c level and BG home readings through collaboration with PharmD and provider.    Interventions: . 1:1 collaboration with Glean Hess, MD regarding development and update of comprehensive plan of care as evidenced by provider attestation and co-signature . Inter-disciplinary care team collaboration (see longitudinal plan of care) . Comprehensive medication review performed; medication list updated in electronic medical record  Hypertension (BP goal <130/80) -controlled -Current treatment: . Olmesartan-hctz 20/12.5 mg qd . Toprol xl 25 mg qd -Medications previously tried: NA -Current home readings: Not checking at home -Current dietary habits: cooks most meals, good vegetable intake, minimizes starchy foods, avoids sugary beverages -Current exercise habits: walks occasionally--weather permitting -Denies hypotensive/hypertensive symptoms -Educated on BP goals and benefits of medications for prevention of heart attack, stroke and kidney damage; Daily salt intake goal < 2300 mg; Exercise goal of 150 minutes per week; -Counseled to monitor BP at home weekly, document, and provide log at future appointments -Counseled on diet and exercise extensively Recommended to continue current medication  Hyperlipidemia: (LDL goal < 70) -controlled -Current treatment: . Simvastatin 80 mg qd . Aspirin 81 mg qd . Lovaza 2gm bid -Medications previously tried: remote history of "another medication for cholesterol"   -Educated on Cholesterol goals;  Importance of limiting foods high in cholesterol; Exercise goal of 150 minutes per week; Strategies to manage statin-induced myalgias; Patient reports several episodes of leg cramping and one episode of tingling. Reviewed increased risk of myalgias with simvastatin doses > 40 mg. Discussed potential benefit of coQ10 supplementation.  -Counseled on diet and exercise extensively Recommended consider changing to  rosuvastatin 20 mg if leg cramps continue  Diabetes (A1c goal <7%) -uncontrolled -Current medications: Marland Kitchen Metformin XR 1000 mg (561m x2) bid . Ozempic 0.555mqweek . Glimepiride 2 mg bid -Medications previously tried: Jardiance- yeast infections, Janumet- cost   -Current home glucose readings . fasting glucose: 212 today, 170s  Does not check daily or document . post prandial glucose: not checking -Denies hypoglycemic/hyperglycemic symptoms -Current meal patterns:  . Ate dinner after 8 pm last night  -Current exercise: walks weather premitting -Educated onA1c and blood sugar goals; Exercise goal of 150 minutes per week; Benefits of routine self-monitoring of blood sugar; -Counseled to check feet daily  and get yearly eye exams. Patient states she will call today to schedule eye exam. -Counseled on diet and exercise extensively Recommended to increase Ozempic dose to 1.0 mg if A1c and SMBG readings remain above goal. Recommend B12 level with next labs. Counseled on B12  depletion with chronic  metformin and CNS role Assessed patient finances. PAP applications received by patient. Encouraged her to complete and return asap to prevent lapse in therapy. Currently has 5 sample doses remaining.  Metformin XR is much more affordable than janumet.     Patient Goals/Self-Care Activities . Over the next 60  days, patient will:  - take medications as prescribed check glucose twice daily, document, and provide at future appointments collaborate with provider on medication access solutions target a minimum of 150 minutes of moderate intensity exercise weekly  Complete and return patient assistance application  Follow Up Plan: Telephone follow up appointment with care management team member scheduled for: 2 months         Medication Assistance: Application for Ozempic  medication assistance program mailed to patient. Awaiting return.   Patient's preferred pharmacy is:  [id number] [place], [place] - [address] AT [address] [place] [address] Phone: [phone] Fax: [phone]  Uses pill box? Yes Pt endorses 95 % compliance  We discussed: Benefits of medication synchronization, packaging and delivery as well as enhanced pharmacist oversight with [employer]. Patient decided to: Continue current medication management strategy  Care Plan and Follow Up Patient Decision:  Patient agrees to Care Plan and Follow-up.  Plan: Telephone follow up appointment with care management team member scheduled for:  next month.

## 2020-12-18 NOTE — Patient Instructions (Addendum)
Visit Information  It was a pleasure speaking with you today. Thank you for letting me be part of your clinical team. Please call with any questions or concerns.   Goals Addressed            This Visit's Progress   . Manage My Medicine       Timeframe:  Short-Term Goal Priority:  High Start Date:                             Expected End Date:                       Follow Up Date March DM assessment call    - call for medicine refill 2 or 3 days before it runs out - call if I am sick and can't take my medicine - keep a list of all the medicines I take; vitamins and herbals too - use a pillbox to sort medicine  -Complete and return patient assistance application for Ozempic to avoid lapse in therapy   Why is this important?   . These steps will help you keep on track with your medicines.   Notes:     Marland Kitchen Monitor and Manage My Blood Sugar-Diabetes Type 2       Timeframe:  Long-Range Goal Priority:  High Start Date:    12/11/20                         Expected End Date:                       Follow Up Date 02/12/21- check blood sugar at prescribed times - enter blood sugar readings and medication or insulin into daily log - take the blood sugar log to all doctor visits - take the blood sugar meter to all doctor visits    Why is this important?    Checking your blood sugar at home helps to keep it from getting very high or very low.   Writing the results in a diary or log helps the doctor know how to care for you.   Your blood sugar log should have the time, date and the results.   Also, write down the amount of insulin or other medicine that you take.   Other information, like what you ate, exercise done and how you were feeling, will also be helpful.     Notes:     . Obtain Eye Exam-Diabetes Type 2       Follow Up Date 02/12/21    - schedule appointment with eye doctor    Why is this important?    Eye check-ups are important when you have diabetes.   Vision loss  can be prevented.    Notes:  Patient to call and schedule today.    Marland Kitchen Perform Foot Care-Diabetes Type 2       Timeframe:  Short-Term Goal Priority:  Medium Start Date:                             Expected End Date:                       Follow Up Date April follow up   - check feet daily for cuts, sores or redness - keep feet up while  sitting - trim toenails straight across - wear comfortable, cotton socks - wear comfortable, well-fitting shoes    Why is this important?    Good foot care is very important when you have diabetes.   There are many things you can do to keep your feet healthy and catch a problem early.    Notes:     . Track and Manage My Blood Pressure-Hypertension       Timeframe:  Long-Range Goal Priority:  Medium Start Date:                             Expected End Date:                       Follow Up Date April follow-up    - check blood pressure weekly    Why is this important?    You won't feel high blood pressure, but it can still hurt your blood vessels.   High blood pressure can cause heart or kidney problems. It can also cause a stroke.   Making lifestyle changes like losing a little weight or eating less salt will help.   Checking your blood pressure at home and at different times of the day can help to control blood pressure.   If the doctor prescribes medicine remember to take it the way the doctor ordered.   Call the office if you cannot afford the medicine or if there are questions about it.     Notes:        The patient verbalized understanding of instructions, educational materials, and care plan provided today and agreed to receive a mailed copy of patient instructions, educational materials, and care plan.   Telephone follow up appointment with pharmacy team member scheduled for: March DM assessment wit CPA and follow up on PAP application for Ozempic.  Junita Push. Lagretta Loseke PharmD, BCPS Clinical  Pharmacist [phone]  Living With Diabetes Diabetes (type 1 diabetes mellitus or type 2 diabetes mellitus) is a condition in which the body does not have enough of a hormone called insulin, or the body does not respond properly to insulin. Normally, insulin allows sugars (glucose) to enter cells in the body. With diabetes, extra glucose builds up in the blood instead of going into cells. This results in high blood glucose (hyperglycemia). How to manage lifestyle changes Managing diabetes includes medical treatments as well as lifestyle changes. If diabetes is not managed well, serious physical and emotional complications can occur. Taking good care of yourself means that you are responsible for:  Monitoring glucose regularly.  Eating a healthy diet.  Exercising regularly.  Meeting with health care providers.  Taking medicines as directed. Most people feel some stress about managing their diabetes. When this stress becomes too much, it is known as diabetes-related distress. This is very common. Living with diabetes can place you at risk for diabetes distress, depression, or anxiety. These disorders can make diabetes more difficult to manage. How to recognize stress You may have diabetes distress if you:  Avoid or ignore your daily diabetes care. This includes glucose testing, following a meal plan, and taking medications.  Feel overwhelmed by your daily diabetes care.  Experience emotional reactions such as anger, sadness, or fear related to your daily diabetes care.  Feel fear or shame about not doing everything perfectly that you have been told to do. Emotional distress Symptoms of diabetes distress include:  Anger about  having a diagnosis of diabetes.  Fear or frustration about your diagnosis and the changes you need to make to manage the condition.  Being overly worried about the care that you need or the cost of the care that you need.  Feeling like you caused your  condition by doing something wrong.  Fear about unpredictable fluctuations in your blood glucose, like low or high blood glucose.  Feeling judged by your health care providers.  Feeling very alone with the disease. Depression Having diabetes means that you are at a higher risk for depression. Your health care provider may test (screen) you for symptoms of depression. It is important to recognize symptoms and to start treatment for depression soon after it is diagnosed. The following are some symptoms of depression:  Loss of interest in things that you used to enjoy.  Feeling depressed much or most of the time.  A change in appetite.  Trouble getting to sleep or staying asleep.  Feeling tired most of the day.  Feeling nervous and anxious.  Feeling guilty and worrying that you are a burden to others.  Having thoughts of hurting yourself or feeling that you want to die. If you have any of these symptoms, more days than not, for 2 weeks or longer, you may have depression. This would be a good time to contact your health care provider. Follow these instructions at home: Managing diabetes distress The following are some ways to manage emotional distress:  Learn as much as you can about diabetes and its treatment. Take one step at a time to improve your management.  Meet with a certified diabetes care and education specialist. Take a class to learn how to manage your condition.  Consider working with a counselor or therapist.  Keep a journal of your thoughts and concerns.  Accept that some things are out of your control.  Talk with other people who have diabetes. It can help to talk about the distress that you feel.  Find ways to manage stress that work for you. These may include art or music therapy, exercise, meditation, and hobbies.  Seek support from spiritual leaders, family, and friends.   General instructions  Do your best to follow your diabetes management plan.  If  you are struggling to follow your plan, talk with a certified diabetes care and education specialist, or with someone else who has diabetes. They may have ideas that will help.  Forgive yourself for not being perfect. Almost everyone struggles with the tasks of diabetes.  Keep all follow-up visits. This is important. Where to find support  Search for information and support from the American Diabetes Association: www.diabetes.org  Find a certified diabetes education and care specialist. Make an appointment through the Association of Diabetes Care & Education Specialists: www.diabeteseducator.org Contact a health care provider if:  You believe your diabetes is getting out of control.  You are concerned you may be depressed.  You think your medications are not helping control your diabetes.  You are feeling overwhelmed with your diabetes. Get help right away if:  You have thoughts about hurting yourself or others. If you ever feel like you may hurt yourself or others, or have thoughts about taking your own life, get help right away. You can go to your nearest emergency department or call:  Your local emergency services (911 in the [place]  A suicide crisis helpline, such as the [place] at 253-481-1930. This is open 24 hours a day. Summary  Diabetes (type 1 diabetes mellitus or type 2 diabetes mellitus) is a condition in which the body does not have enough of a hormone called insulin, or the body does not respond properly to insulin.  Living with diabetes puts you at risk for medical and emotional issues, such as diabetes distress, depression, and anxiety.  Recognizing the symptoms of diabetes distress and depression may help you avoid problems with your diabetes control. If you experience symptoms, it is important to discuss this with your health care provider, certified diabetes care and education specialist, or therapist.  It is important to start  treatment for diabetes distress and depression soon after diagnosis.  Ask your health care provider to recommend a therapist who understands both depression and diabetes. This information is not intended to replace advice given to you by your health care provider. Make sure you discuss any questions you have with your health care provider. Document Revised: 02/23/2020 Document Reviewed: 02/23/2020 Elsevier Patient Education  2021 [place].  Muscle Pain, Adult Muscle pain, also called myalgia, is a condition in which a person has pain in one or more muscles in the body. Muscle pain may be mild, moderate, or severe. It may feel sharp, achy, or burning. In most cases, the pain lasts only a short time and goes away without treatment. Muscle pain can result from using muscles in a new or different way or after a period of inactivity. It is normal to feel some muscle pain after starting an exercise program. Muscles that have not been used often will be sore at first. What are the causes? This condition is caused by using muscles in a new or different way after a period of inactivity. Other causes may include:  Overuse or muscle strain, especially if you are not in shape. This is the most common cause of muscle pain.  Injury or bruising.  Infectious diseases, including diseases caused by viruses, such as the flu (influenza).  Fibromyalgia.This is a long-term, or chronic, condition that causes muscle tenderness, tiredness (fatigue), and headache.  Autoimmune or rheumatologic diseases. These are conditions, such as lupus, in which the body's defense system (immunesystem) attacks areas in the body.  Certain medicines, including ACE inhibitors and statins. What are the signs or symptoms? The main symptom of this condition is sore or painful muscles, including during activity and when stretching. You may also have slight swelling. How is this diagnosed? This condition is diagnosed with a physical  exam. Your health care provider will ask questions about your pain and when it began. If you have not had muscle pain for very long, your health care provider may want to wait before doing much testing. If your muscle pain has lasted a long time, tests may be done right away. In some cases, this may include tests to rule out certain conditions or illnesses. How is this treated? Treatment for this condition depends on the cause. Home care is often enough to relieve muscle pain. Your health care provider may also prescribe NSAIDs, such as ibuprofen. Follow these instructions at home: Medicines  Take over-the-counter and prescription medicines only as told by your health care provider.  Ask your health care provider if the medicine prescribed to you requires you to avoid driving or using machinery. Managing pain, swelling, and discomfort  If directed, put ice on the painful area. To do this: ? Put ice in a plastic bag. ? Place a towel between your skin and the bag. ? Leave the ice on for  20 minutes, 2-3 times a day.  For the first 2 days of muscle soreness, or if there is swelling: ? Do not soak in hot baths. ? Do not use a hot tub, steam room, sauna, heating pad, or other heat source.  After 48-72 hours, you may alternate between applying ice and applying heat as told by your health care provider. If directed, apply heat to the affected area as often as told by your health care provider. Use the heat source that your health care provider recommends, such as a moist heat pack or a heating pad. ? Place a towel between your skin and the heat source. ? Leave the heat on for 20-30 minutes. ? Remove the heat if your skin turns bright red. This is especially important if you are unable to feel pain, heat, or cold. You may have a greater risk of getting burned.  If you have an injury, raise (elevate) the injured area above the level of your heart while you are sitting or lying down.       Activity  If overuse is causing your muscle pain: ? Slow down your activities until the pain goes away. ? Do regular, gentle exercises if you are not usually active. ? Warm up before exercising. Stretch before and after exercising. This can help lower the risk of muscle pain.  Do not continue working out if the pain is severe. Severe pain could mean that you have injured a muscle.  Do not lift anything that is heavier than 5-10 lb (2.3-4.5 kg), or the limit that you are told, until your health care provider says that it is safe.  Return to your normal activities as told by your health care provider. Ask your health care provider what activities are safe for you.   General instructions  Do not use any products that contain nicotine or tobacco, such as cigarettes, e-cigarettes, and chewing tobacco. These can delay healing. If you need help quitting, ask your health care provider.  Keep all follow-up visits as told by your health care provider. This is important. Contact a health care provider if you have:  Muscle pain that gets worse and medicines do not help.  Muscle pain that lasts longer than 3 days.  A rash or fever along with muscle pain.  Muscle pain after a tick bite.  Muscle pain while working out, even though you are in good physical condition.  Redness, soreness, or swelling along with muscle pain.  Muscle pain after starting a new medicine or changing the dose of a medicine. Get help right away if you have:  Trouble breathing.  Trouble swallowing.  Muscle pain along with a stiff neck, fever, and vomiting.  Severe muscle weakness or you cannot move part of your body. These symptoms may represent a serious problem that is an emergency. Do not wait to see if the symptoms will go away. Get medical help right away. Call your local emergency services (911 in the [place] Do not drive yourself to the hospital. Summary  Muscle pain usually lasts only a short time and goes  away without treatment.  This condition is caused by using muscles in a new or different way after a period of inactivity.  If your muscle pain lasts longer than 3 days, tell your health care provider. This information is not intended to replace advice given to you by your health care provider. Make sure you discuss any questions you have with your health care provider. Document Revised: 07/22/2019  Document Reviewed: 07/22/2019 Elsevier Patient Education  [place].

## 2020-12-31 ENCOUNTER — Other Ambulatory Visit: Payer: Self-pay

## 2020-12-31 ENCOUNTER — Ambulatory Visit (INDEPENDENT_AMBULATORY_CARE_PROVIDER_SITE_OTHER): Payer: Medicare Other | Admitting: Internal Medicine

## 2020-12-31 ENCOUNTER — Encounter: Payer: Self-pay | Admitting: Internal Medicine

## 2020-12-31 VITALS — BP 138/68 | HR 76 | Temp 97.6°F | Ht 68.0 in | Wt 186.0 lb

## 2020-12-31 DIAGNOSIS — E118 Type 2 diabetes mellitus with unspecified complications: Secondary | ICD-10-CM | POA: Diagnosis not present

## 2020-12-31 DIAGNOSIS — I10 Essential (primary) hypertension: Secondary | ICD-10-CM

## 2020-12-31 LAB — POCT GLYCOSYLATED HEMOGLOBIN (HGB A1C): Hemoglobin A1C: 7.6 % — AB (ref 4.0–5.6)

## 2020-12-31 NOTE — Progress Notes (Signed)
Date:  12/31/2020   Name:  Kimberly Salazar   DOB:  02-17-1946   MRN:  [id number]   Chief Complaint: Diabetes  Diabetes She presents for her follow-up diabetic visit. She has type 2 diabetes mellitus. Her disease course has been stable. Pertinent negatives for hypoglycemia include no headaches or tremors. Pertinent negatives for diabetes include no chest pain, no fatigue, no polydipsia and no polyuria. Current diabetic treatment includes oral agent (triple therapy) (ozempic, metformin, glipizide - ozempic started last visit). She is compliant with treatment all of the time. Her weight is decreasing steadily. She is following a generally healthy diet. There is no change in her home blood glucose trend. An ACE inhibitor/angiotensin II receptor blocker is being taken.  Hypertension This is a chronic problem. The problem is controlled. Pertinent negatives include no chest pain, headaches, palpitations or shortness of breath. Past treatments include angiotensin blockers, diuretics and beta blockers. The current treatment provides significant improvement.    Lab Results  Component Value Date   CREATININE 0.97 09/13/2020   BUN 18 09/13/2020   NA 139 09/13/2020   K 4.7 09/13/2020   CL 102 09/13/2020   CO2 22 09/13/2020   Lab Results  Component Value Date   CHOL 129 05/04/2020   HDL 51 05/04/2020   LDLCALC 44 05/04/2020   TRIG 170 (H) 05/04/2020   CHOLHDL 2.5 05/04/2020   Lab Results  Component Value Date   TSH 2.600 05/04/2020   Lab Results  Component Value Date   HGBA1C 8.7 (H) 09/13/2020   Lab Results  Component Value Date   WBC 7.7 05/04/2020   HGB 14.1 05/04/2020   HCT 39.3 05/04/2020   MCV 86.2 05/04/2020   PLT 172 05/04/2020   Lab Results  Component Value Date   ALT 41 05/04/2020   AST 45 (H) 05/04/2020   ALKPHOS 45 05/04/2020   BILITOT 0.9 05/04/2020     Review of Systems  Constitutional: Negative for appetite change, fatigue, fever and unexpected weight change.   HENT: Negative for tinnitus and trouble swallowing.   Eyes: Negative for visual disturbance.  Respiratory: Negative for cough, chest tightness and shortness of breath.   Cardiovascular: Negative for chest pain, palpitations and leg swelling.  Gastrointestinal: Negative for abdominal pain.  Endocrine: Negative for polydipsia and polyuria.  Genitourinary: Negative for dysuria and hematuria.  Musculoskeletal: Negative for arthralgias.  Neurological: Negative for tremors, numbness and headaches.  Psychiatric/Behavioral: Negative for dysphoric mood.    Patient Active Problem List   Diagnosis Date Noted  . Ovarian failure 04/09/2017  . Basal cell carcinoma, leg 07/15/2016  . Abnormal finding on thallium stress test 05/17/2015  . Hyperlipidemia associated with type 2 diabetes mellitus ([place]) 04/12/2015  . Essential (primary) hypertension 04/12/2015  . History of uterine cancer 04/12/2015  . Type II diabetes mellitus with complication ([place]) 09/98/3382  . Cardiac pacemaker in situ 10/11/2009    No Known Allergies  Past Surgical History:  Procedure Laterality Date  . ABDOMINAL HYSTERECTOMY  1994   Uterine CA  . APPENDECTOMY    . CATARACT EXTRACTION Left 2011  . LITHOTRIPSY  2005  . PACEMAKER INSERTION  2010    Social History   Tobacco Use  . Smoking status: Never Smoker  . Smokeless tobacco: Never Used  Vaping Use  . Vaping Use: Never used  Substance Use Topics  . Alcohol use: No    Alcohol/week: 0.0 standard drinks  . Drug use: No     Medication list  has been reviewed and updated.  Current Meds  Medication Sig  . aspirin 81 MG chewable tablet Chew 1 tablet by mouth daily.  . cholecalciferol (VITAMIN D3) 25 MCG (1000 UT) tablet Take 1,000 Units by mouth daily.  Marland Kitchen glimepiride (AMARYL) 2 MG tablet TAKE 1 TABLET BY MOUTH TWICE DAILY WITH FOOD  . glucose blood (ONE TOUCH ULTRA TEST) test strip 1 each by Other route 2 (two) times daily.  . metFORMIN (GLUCOPHAGE-XR) 500 MG  24 hr tablet Take 2 tablets (1,000 mg total) by mouth in the morning and at bedtime.  . metoprolol succinate (TOPROL-XL) 25 MG 24 hr tablet TAKE 1 TABLET BY MOUTH EVERY DAY  . Multiple Vitamins-Minerals (MULTIVITAL) tablet Take 1 tablet by mouth daily.  Marland Kitchen olmesartan-hydrochlorothiazide (BENICAR HCT) 20-12.5 MG tablet TAKE 1 TABLET BY MOUTH DAILY  . omega-3 acid ethyl esters (LOVAZA) 1 g capsule TAKE 4 CAPSULES BY MOUTH EVERY DAY  . Semaglutide,0.25 or 0.5MG /DOS, (OZEMPIC, 0.25 OR 0.5 MG/DOSE,) 2 MG/1.5ML SOPN Inject 0.5 mg into the skin once a week.  . simvastatin (ZOCOR) 80 MG tablet TAKE 1 TABLET(80 MG) BY MOUTH DAILY    PHQ 2/9 Scores 12/31/2020 10/01/2020 09/13/2020 05/04/2020  PHQ - 2 Score 0 0 0 0  PHQ- 9 Score 0 - 0 0    GAD 7 : Generalized Anxiety Score 12/31/2020 09/13/2020 05/04/2020 02/06/2020  Nervous, Anxious, on Edge 0 0 0 0  Control/stop worrying 0 0 0 0  Worry too much - different things 0 0 0 0  Trouble relaxing 0 0 0 0  Restless 0 0 0 0  Easily annoyed or irritable 0 0 0 0  Afraid - awful might happen 0 0 0 0  Total GAD 7 Score 0 0 0 0  Anxiety Difficulty - Not difficult at all Not difficult at all Not difficult at all    BP Readings from Last 3 Encounters:  12/31/20 138/68  10/01/20 138/72  09/13/20 139/70    Physical Exam Vitals and nursing note reviewed.  Constitutional:      General: She is not in acute distress.    Appearance: She is well-developed.  HENT:     Head: Normocephalic and atraumatic.  Cardiovascular:     Rate and Rhythm: Normal rate and regular rhythm.     Pulses: Normal pulses.  Pulmonary:     Effort: Pulmonary effort is normal. No respiratory distress.     Breath sounds: No wheezing or rhonchi.  Musculoskeletal:     Right lower leg: No edema.     Left lower leg: No edema.  Skin:    General: Skin is warm and dry.     Capillary Refill: Capillary refill takes less than 2 seconds.     Findings: No rash.  Neurological:     General: No focal  deficit present.     Mental Status: She is alert and oriented to person, place, and time.  Psychiatric:        Mood and Affect: Mood normal.        Behavior: Behavior normal.     Wt Readings from Last 3 Encounters:  12/31/20 186 lb (84.4 kg)  10/01/20 198 lb 3.2 oz (89.9 kg)  09/13/20 198 lb (89.8 kg)    BP 138/68   Pulse 76   Temp 97.6 F (36.4 C) (Oral)   Ht 5\' 8"  (1.727 m)   Wt 186 lb (84.4 kg)   SpO2 97%   BMI 28.28 kg/m   Assessment and  Plan: 1. Type II diabetes mellitus with complication (HCC) Clinically stable by exam and report without s/s of hypoglycemia. DM complicated by HTN. Tolerating medications well without side effects or other concerns.  No problems with Ozempic but has not yet picked up Rx. Still using samples 0.5 mg. Will continue same regimen. - POCT HgB A1C - 7.6  2. Essential (primary) hypertension Clinically stable exam with well controlled BP. Tolerating medications without side effects at this time. Pt to continue current regimen and low sodium diet; benefits of regular exercise as able discussed.   Partially dictated using Editor, commissioning. Any errors are unintentional.  Halina Maidens, MD [facility]  12/31/2020

## 2021-01-01 ENCOUNTER — Telehealth: Payer: Self-pay | Admitting: Pharmacist

## 2021-01-01 NOTE — Telephone Encounter (Signed)
Received partial Ozempic patient assistance application and proof of income.  Mailed patient signature pages to patient for completion. Patient made aware and will complete and return to the office.

## 2021-01-05 ENCOUNTER — Other Ambulatory Visit: Payer: Self-pay | Admitting: Internal Medicine

## 2021-01-05 DIAGNOSIS — I10 Essential (primary) hypertension: Secondary | ICD-10-CM

## 2021-01-16 ENCOUNTER — Telehealth: Payer: Self-pay

## 2021-01-16 NOTE — Telephone Encounter (Signed)
Completed PA waiting for insurance approval.   (Key: [id number])  KP

## 2021-01-17 NOTE — Telephone Encounter (Signed)
Approved until 01/16/2022.  KP

## 2021-01-18 ENCOUNTER — Telehealth: Payer: Self-pay | Admitting: Pharmacist

## 2021-01-18 NOTE — Chronic Care Management (AMB) (Cosign Needed)
Chronic Care Management Pharmacy Assistant   Name: Chyenne Sobczak  MRN: [id number] DOB: 1946/01/18  Reason for Encounter: Disease State/Diabetes Adherence Call  Recent office visits:  12/31/2020 OV PCP Halina Maidens, MD; no medication changes noted.  Recent consult visits:  [facility] visits:  None in previous 6 months  Medications: Outpatient Encounter Medications as of 01/18/2021  Medication Sig   aspirin 81 MG chewable tablet Chew 1 tablet by mouth daily.   cholecalciferol (VITAMIN D3) 25 MCG (1000 UT) tablet Take 1,000 Units by mouth daily.   glimepiride (AMARYL) 2 MG tablet TAKE 1 TABLET BY MOUTH TWICE DAILY WITH FOOD   glucose blood (ONE TOUCH ULTRA TEST) test strip 1 each by Other route 2 (two) times daily.   metFORMIN (GLUCOPHAGE-XR) 500 MG 24 hr tablet Take 2 tablets (1,000 mg total) by mouth in the morning and at bedtime.   metoprolol succinate (TOPROL-XL) 25 MG 24 hr tablet TAKE 1 TABLET BY MOUTH EVERY DAY   Multiple Vitamins-Minerals (MULTIVITAL) tablet Take 1 tablet by mouth daily.   olmesartan-hydrochlorothiazide (BENICAR HCT) 20-12.5 MG tablet TAKE 1 TABLET BY MOUTH DAILY   omega-3 acid ethyl esters (LOVAZA) 1 g capsule TAKE 4 CAPSULES BY MOUTH EVERY DAY   Semaglutide,0.25 or 0.5MG /DOS, (OZEMPIC, 0.25 OR 0.5 MG/DOSE,) 2 MG/1.5ML SOPN Inject 0.5 mg into the skin once a week.   simvastatin (ZOCOR) 80 MG tablet TAKE 1 TABLET(80 MG) BY MOUTH DAILY   No facility-administered encounter medications on file as of 01/18/2021.   Recent Relevant Labs: Lab Results  Component Value Date/Time   HGBA1C 7.6 (A) 12/31/2020 09:14 AM   HGBA1C 8.7 (H) 09/13/2020 09:50 AM   HGBA1C 8.2 (H) 05/04/2020 11:24 AM    Kidney Function Lab Results  Component Value Date/Time   CREATININE 0.97 09/13/2020 09:50 AM   CREATININE 1.07 (H) 05/04/2020 11:24 AM   GFRNONAA 57 (L) 09/13/2020 09:50 AM   GFRAA 66 09/13/2020 09:50 AM    Current antihyperglycemic regimen:  Glimepiride 2 mg  tablet one a day Metformin 500 mg XR two tablets in the morning and at bedtime Ozempic 0.5 mg; 0.5 mg into the skin once a week  What recent interventions/DTPs have been made to improve glycemic control:    Have there been any recent hospitalizations or ED visits since last visit with CPP? No   Patient  hypoglycemic symptoms, including    Patient  hyperglycemic symptoms, including    How often are you checking your blood sugar?    What are your blood sugars ranging?  Fasting:  Before meals:  After meals:  Bedtime:   During the week, how often does your blood glucose drop below 70?    Are you checking your feet daily/regularly?   Adherence Review: Is the patient currently on a STATIN medication?  Is the patient currently on ACE/ARB medication?  Does the patient have >5 day gap between last estimated fill dates?     Future Appointments  Date Time Provider [place]  02/12/2021 10:00 AM [facility]  05/06/2021  8:40 AM Glean Hess, MD [facility]  10/02/2021  2:40 PM [place] ADVISOR [facility]     Star Rating Drugs: Glimepiride 2 mg last filled 12/04/2020 90 DS Olmesartan/HCTZ 20-12.5 mg last filled 01/05/2021 90 DS Janumet 50-1000 mg last filled 01/05/2021 90 DS Simvastatin 80 mg last filled 01/05/2021 Metformin ER 500 mg last filled 11/20/2020 90 DS  **Unsuccessful attempt to reach patient to complete  diabetes adherence call. Chart review completed.  April D Calhoun, [place] Pharmacist Assistant [phone]

## 2021-01-22 LAB — HM DIABETES EYE EXAM

## 2021-02-12 ENCOUNTER — Telehealth: Payer: Medicare Other

## 2021-02-12 ENCOUNTER — Telehealth: Payer: Self-pay | Admitting: Pharmacist

## 2021-02-12 NOTE — Progress Notes (Deleted)
Chronic Care Management Pharmacy Note  02/12/2021 Name:  Kimberly Salazar MRN:  [id number] DOB:  May 31, 1946  Subjective: Kimberly Salazar is an 75 y.o. year old female who is a primary patient of Army Melia, Jesse Sans, MD.  The CCM team was consulted for assistance with disease management and care coordination needs.    Engaged with patient by telephone for follow up visit in response to provider referral for pharmacy case management and/or care coordination services.   Consent to Services:  The patient was given information about Chronic Care Management services, agreed to services, and gave verbal consent prior to initiation of services.  Please see initial visit note for detailed documentation.   Patient Care Team: Glean Hess, MD as PCP - General (Internal Medicine) Luana Shu, DO as Referring Physician (Cardiology) Edwyna Ready, MD as Referring Physician (Cardiology) Criss Alvine, MD as Referring Physician (Dermatology) [facility] (Optometry) Vladimir Faster, [facility] (Pharmacist)  Recent office visits: 12/31/20- A1c 7.6,    Recent consult visits: 02/07/21- [place](cardiology) -pacemaker adjustment 11/09/20- Dr. Casper Harrison, cardiology- pacemaker fitting and adjustment  Hospital visits: None in previous 6 months  Objective:  Lab Results  Component Value Date   CREATININE 0.97 09/13/2020   BUN 18 09/13/2020   GFRNONAA 57 (L) 09/13/2020   GFRAA 66 09/13/2020   NA 139 09/13/2020   K 4.7 09/13/2020   CALCIUM 9.8 09/13/2020   CO2 22 09/13/2020    Lab Results  Component Value Date/Time   HGBA1C 7.6 (A) 12/31/2020 09:14 AM   HGBA1C 8.7 (H) 09/13/2020 09:50 AM   HGBA1C 8.2 (H) 05/04/2020 11:24 AM    Last diabetic Eye exam:  Lab Results  Component Value Date/Time   HMDIABEYEEXA No Retinopathy 06/02/2017 12:00 AM    Last diabetic Foot exam: No results found for: HMDIABFOOTEX   Lab Results  Component Value Date   CHOL 129 05/04/2020   HDL 51  05/04/2020   LDLCALC 44 05/04/2020   TRIG 170 (H) 05/04/2020   CHOLHDL 2.5 05/04/2020    Hepatic Function Latest Ref Rng & Units 05/04/2020 05/03/2019 07/26/2018  Total Protein 6.5 - 8.1 g/dL 7.4 6.5 7.2  Albumin 3.5 - 5.0 g/dL 4.1 4.5 4.8  AST 15 - 41 U/L 45(H) 28 38  ALT 0 - 44 U/L 41 28 38(H)  Alk Phosphatase 38 - 126 U/L 45 44 52  Total Bilirubin 0.3 - 1.2 mg/dL 0.9 0.6 0.7    Lab Results  Component Value Date/Time   TSH 2.600 05/04/2020 11:24 AM   TSH 3.290 05/03/2019 08:44 AM   TSH 2.820 03/23/2018 09:06 AM    CBC Latest Ref Rng & Units 05/04/2020 05/03/2019 03/23/2018  WBC 4.0 - 10.5 K/uL 7.7 6.6 6.6  Hemoglobin 12.0 - 15.0 g/dL 14.1 13.3 14.5  Hematocrit 36.0 - 46.0 % 39.3 38.9 43.0  Platelets 150 - 400 K/uL 172 157 195    No results found for: VD25OH  Clinical ASCVD: No  The ASCVD Risk score Mikey Bussing DC Jr., et al., 2013) failed to calculate for the following reasons:   The valid total cholesterol range is 130 to 320 mg/dL    Depression screen [facility] 2/9 12/31/2020 10/01/2020 09/13/2020  Decreased Interest 0 0 0  Down, Depressed, Hopeless 0 0 0  PHQ - 2 Score 0 0 0  Altered sleeping 0 - 0  Tired, decreased energy 0 - 0  Change in appetite 0 - 0  Feeling bad or failure about yourself  0 -  0  Trouble concentrating 0 - 0  Moving slowly or fidgety/restless 0 - 0  Suicidal thoughts 0 - 0  PHQ-9 Score 0 - 0  Difficult doing work/chores - - Not difficult at all    CHA2DS2-VASc Score = 5  The patient's score is based upon: CHF History: No HTN History: Yes Diabetes History: Yes Stroke History: No Vascular Disease History: No Age Score: 2 Gender Score: 1  {  Social History   Tobacco Use  Smoking Status Never Smoker  Smokeless Tobacco Never Used   BP Readings from Last 3 Encounters:  12/31/20 138/68  10/01/20 138/72  09/13/20 139/70   Pulse Readings from Last 3 Encounters:  12/31/20 76  10/01/20 80  09/13/20 66   Wt Readings from Last 3 Encounters:  12/31/20  186 lb (84.4 kg)  10/01/20 198 lb 3.2 oz (89.9 kg)  09/13/20 198 lb (89.8 kg)    Assessment/Interventions: Review of patient past medical history, allergies, medications, health status, including review of consultants reports, laboratory and other test data, was performed as part of comprehensive evaluation and provision of chronic care management services.   SDOH:  (Social Determinants of Health) assessments and interventions performed: No     Immunization History  Administered Date(s) Administered  . Fluad Quad(high Dose 65+) 09/08/2019, 09/13/2020  . Influenza, High Dose Seasonal PF 09/30/2018  . Influenza,inj,Quad PF,6+ Mos 11/19/2015, 07/15/2016  . Influenza-Unspecified 06/27/2017  . Moderna Sars-Covid-2 Vaccination 12/22/2019, 01/19/2020  . Pneumococcal Conjugate-13 09/15/2014  . Pneumococcal-Unspecified 06/02/2011  . Tdap 06/02/2011  . Zoster 10/27/2006    Conditions to be addressed/monitored:  Hypertension, Hyperlipidemia, Diabetes and Atrial Fibrillation  There are no care plans that you recently modified to display for this patient.    Medication Assistance: Application for Ozempic  medication assistance program mailed to patient. Awaiting return.   Patient's preferred pharmacy is:  [id number] [address], [place] - [address] AT [address] [place] [place] [address] Phone: [phone] Fax: [phone]  Uses pill box? Yes Pt endorses 95 % compliance  We discussed: Benefits of medication synchronization, packaging and delivery as well as enhanced pharmacist oversight with [employer]. Patient decided to: Continue current medication management strategy  Care Plan and Follow Up Patient Decision:  Patient agrees to Care Plan and Follow-up.  Plan: Telephone follow up appointment with care management team member scheduled for:  next month.

## 2021-02-12 NOTE — Telephone Encounter (Signed)
  Chronic Care Management   Outreach Note  02/12/2021 Name: Kimberly Salazar MRN: [id number] DOB: Jul 20, 1946  Referred by: Glean Hess, MD Reason for referral : CCM  An unsuccessful telephone outreach was attempted today. The patient was referred to the pharmacist for assistance with care management and care coordination.  Left HIPAA compliant messageto return my call at her  convenience.  Will ask CPA to  outreach patient in next 2-4  weeks for continued medication management concerns and reschedule with me.  Junita Push. Kenton Kingfisher PharmD, [facility] [phone]

## 2021-03-04 ENCOUNTER — Telehealth: Payer: Self-pay | Admitting: Pharmacist

## 2021-03-04 NOTE — Chronic Care Management (AMB) (Addendum)
    Chronic Care Management Pharmacy Assistant   Name: Kimberly Salazar  MRN: [id number] DOB: 1946-08-30   Reason for Encounter: Disease State Diabetes Mellitus    Recent office visits:  None noted  Recent consult visits:  None noted  Hospital visits:  None in previous 6 months  Medications: Outpatient Encounter Medications as of 03/04/2021  Medication Sig   aspirin 81 MG chewable tablet Chew 1 tablet by mouth daily.   cholecalciferol (VITAMIN D3) 25 MCG (1000 UT) tablet Take 1,000 Units by mouth daily.   glimepiride (AMARYL) 2 MG tablet TAKE 1 TABLET BY MOUTH TWICE DAILY WITH FOOD   glucose blood (ONE TOUCH ULTRA TEST) test strip 1 each by Other route 2 (two) times daily.   metFORMIN (GLUCOPHAGE-XR) 500 MG 24 hr tablet Take 2 tablets (1,000 mg total) by mouth in the morning and at bedtime.   metoprolol succinate (TOPROL-XL) 25 MG 24 hr tablet TAKE 1 TABLET BY MOUTH EVERY DAY   Multiple Vitamins-Minerals (MULTIVITAL) tablet Take 1 tablet by mouth daily.   olmesartan-hydrochlorothiazide (BENICAR HCT) 20-12.5 MG tablet TAKE 1 TABLET BY MOUTH DAILY   omega-3 acid ethyl esters (LOVAZA) 1 g capsule TAKE 4 CAPSULES BY MOUTH EVERY DAY   Semaglutide,0.25 or 0.5MG /DOS, (OZEMPIC, 0.25 OR 0.5 MG/DOSE,) 2 MG/1.5ML SOPN Inject 0.5 mg into the skin once a week.   simvastatin (ZOCOR) 80 MG tablet TAKE 1 TABLET(80 MG) BY MOUTH DAILY   No facility-administered encounter medications on file as of 03/04/2021.   Recent Relevant Labs: Lab Results  Component Value Date/Time   HGBA1C 7.6 (A) 12/31/2020 09:14 AM   HGBA1C 8.7 (H) 09/13/2020 09:50 AM   HGBA1C 8.2 (H) 05/04/2020 11:24 AM    Kidney Function Lab Results  Component Value Date/Time   CREATININE 0.97 09/13/2020 09:50 AM   CREATININE 1.07 (H) 05/04/2020 11:24 AM   GFRNONAA 57 (L) 09/13/2020 09:50 AM   GFRAA 66 09/13/2020 09:50 AM    Current antihyperglycemic regimen:  Glimepiride 2 mg tablet one a day Metformin 500 mg XR two tablets in  the morning and at bedtime Ozempic 0.5 mg; 0.5 mg into the skin once a week  What recent interventions/DTPs have been made to improve glycemic control:  None noted  Have there been any recent hospitalizations or ED visits since last visit with CPP?No   Patient  hypoglycemic symptoms, including   Patient hyperglycemic symptoms, including   How often are you checking your blood sugar?   What are your blood sugars ranging?  Fasting:  Before meals:  After meals:  Bedtime:  During the week, how often does your blood glucose drop below 70?   Are you checking your feet daily/regularly?   Adherence Review: Is the patient currently on a STATIN medication? Yes Is the patient currently on ACE/ARB medication? No Does the patient have >5 day gap between last estimated fill dates? Yes    Star Rating Drugs: Glimepiride 2 mg Last filled:12/04/2020 90 DS Metformin 500 mg Lats filled:11/20/2020 90 DS Simvastatin 80 mg Last filled:01/05/2021 90 DS Ozempic 0.5 mg Last filled:01/13/2021 28 DS  Unable to reach patient. Left voicemail x 6  [place] Pharmacist Assistant [phone]  Addendum 03/22/21 JSH: CPA will outreach and leave message with updated call back number.  Junita Push. Kenton Kingfisher PharmD, [facility] [phone]

## 2021-03-05 ENCOUNTER — Other Ambulatory Visit: Payer: Self-pay | Admitting: Internal Medicine

## 2021-04-19 ENCOUNTER — Telehealth: Payer: Self-pay

## 2021-04-19 NOTE — Telephone Encounter (Signed)
Copied from [place] [phone]. Topic: Referral - Request for Referral >> Apr 19, 2021 11:54 AM Yvette Rack wrote: Has patient seen PCP for this complaint? Yes.   *If NO, is insurance requiring patient see PCP for this issue before PCP can refer them? Referral for which specialty: mammogram Preferred provider/office: Pt stated the mammogram center is in the same office building as the practice  Reason for referral: Pt request mammogram

## 2021-05-06 ENCOUNTER — Ambulatory Visit (INDEPENDENT_AMBULATORY_CARE_PROVIDER_SITE_OTHER): Payer: Medicare Other | Admitting: Internal Medicine

## 2021-05-06 ENCOUNTER — Other Ambulatory Visit: Payer: Self-pay

## 2021-05-06 ENCOUNTER — Encounter: Payer: Self-pay | Admitting: Internal Medicine

## 2021-05-06 VITALS — BP 122/64 | HR 63 | Temp 97.9°F | Ht 68.0 in | Wt 179.0 lb

## 2021-05-06 DIAGNOSIS — Z780 Asymptomatic menopausal state: Secondary | ICD-10-CM

## 2021-05-06 DIAGNOSIS — E118 Type 2 diabetes mellitus with unspecified complications: Secondary | ICD-10-CM

## 2021-05-06 DIAGNOSIS — E1169 Type 2 diabetes mellitus with other specified complication: Secondary | ICD-10-CM

## 2021-05-06 DIAGNOSIS — Z13820 Encounter for screening for osteoporosis: Secondary | ICD-10-CM

## 2021-05-06 DIAGNOSIS — Z1231 Encounter for screening mammogram for malignant neoplasm of breast: Secondary | ICD-10-CM | POA: Diagnosis not present

## 2021-05-06 DIAGNOSIS — I10 Essential (primary) hypertension: Secondary | ICD-10-CM | POA: Diagnosis not present

## 2021-05-06 DIAGNOSIS — M8580 Other specified disorders of bone density and structure, unspecified site: Secondary | ICD-10-CM

## 2021-05-06 DIAGNOSIS — E785 Hyperlipidemia, unspecified: Secondary | ICD-10-CM

## 2021-05-06 LAB — POCT URINALYSIS DIPSTICK
Bilirubin, UA: NEGATIVE
Blood, UA: NEGATIVE
Glucose, UA: NEGATIVE
Ketones, UA: NEGATIVE
Leukocytes, UA: NEGATIVE
Nitrite, UA: NEGATIVE
Protein, UA: NEGATIVE
Spec Grav, UA: >=1.03 — AB (ref 1.010–1.025)
Urobilinogen, UA: 0.2 E.U./dL
pH, UA: 5 (ref 5.0–8.0)

## 2021-05-06 NOTE — Progress Notes (Signed)
Date:  05/06/2021   Name:  Kimberly Salazar   DOB:  03/01/1946   MRN:  [id number]   Chief Complaint: Annual Exam (Breast exam no pap) Kimberly Salazar is a 75 y.o. female who presents today for her Complete Annual Exam. She feels well. She reports exercising walking 1 day a week. She reports she is sleeping well. Breast complaints none.  Mammogram: 03/2018 DEXA: none - ordered Colonoscopy: 04/2018 repeat 5 yrs  Immunization History  Administered Date(s) Administered   Fluad Quad(high Dose 65+) 09/08/2019, 09/13/2020   Influenza, High Dose Seasonal PF 09/30/2018   Influenza,inj,Quad PF,6+ Mos 11/19/2015, 07/15/2016   Influenza-Unspecified 06/27/2017   Moderna Sars-Covid-2 Vaccination 12/22/2019, 01/19/2020   Pneumococcal Conjugate-13 09/15/2014   Pneumococcal-Unspecified 06/02/2011   Tdap 06/02/2011   Zoster, Live 10/27/2006    Diabetes She presents for her follow-up diabetic visit. She has type 2 diabetes mellitus. Hypoglycemia symptoms include sweats. Pertinent negatives for hypoglycemia include no dizziness, headaches, nervousness/anxiousness or tremors. Pertinent negatives for diabetes include no chest pain, no fatigue, no polydipsia and no polyuria. There are no hypoglycemic complications. Symptoms are stable. Current diabetic treatment includes oral agent (monotherapy) (and Ozempic). She is compliant with treatment all of the time. Her weight is stable. An ACE inhibitor/angiotensin II receptor blocker is being taken. Eye exam is current.  Hypertension This is a chronic problem. The problem is controlled. Associated symptoms include sweats. Pertinent negatives include no chest pain, headaches, palpitations or shortness of breath. Past treatments include angiotensin blockers, beta blockers and diuretics. The current treatment provides significant improvement.  Hyperlipidemia This is a chronic problem. The problem is controlled. Pertinent negatives include no chest pain or shortness of  breath. Current antihyperlipidemic treatment includes statins. The current treatment provides significant improvement of lipids.   Lab Results  Component Value Date   CREATININE 0.97 09/13/2020   BUN 18 09/13/2020   NA 139 09/13/2020   K 4.7 09/13/2020   CL 102 09/13/2020   CO2 22 09/13/2020   Lab Results  Component Value Date   CHOL 129 05/04/2020   HDL 51 05/04/2020   LDLCALC 44 05/04/2020   TRIG 170 (H) 05/04/2020   CHOLHDL 2.5 05/04/2020   Lab Results  Component Value Date   TSH 2.600 05/04/2020   Lab Results  Component Value Date   HGBA1C 7.6 (A) 12/31/2020   Lab Results  Component Value Date   WBC 7.7 05/04/2020   HGB 14.1 05/04/2020   HCT 39.3 05/04/2020   MCV 86.2 05/04/2020   PLT 172 05/04/2020   Lab Results  Component Value Date   ALT 41 05/04/2020   AST 45 (H) 05/04/2020   ALKPHOS 45 05/04/2020   BILITOT 0.9 05/04/2020     Review of Systems  Constitutional:  Negative for chills, fatigue and fever.  HENT:  Negative for congestion, hearing loss, tinnitus, trouble swallowing and voice change.   Eyes:  Negative for visual disturbance.  Respiratory:  Negative for cough, chest tightness, shortness of breath and wheezing.   Cardiovascular:  Negative for chest pain, palpitations and leg swelling.  Gastrointestinal:  Negative for abdominal pain, constipation, diarrhea and vomiting.  Endocrine: Negative for polydipsia and polyuria.  Genitourinary:  Negative for dysuria, frequency, genital sores, vaginal bleeding and vaginal discharge.  Musculoskeletal:  Negative for arthralgias, gait problem and joint swelling.  Skin:  Negative for color change and rash.  Neurological:  Negative for dizziness, tremors, light-headedness and headaches.  Hematological:  Negative for adenopathy. Does not bruise/bleed easily.  Psychiatric/Behavioral:  Negative for dysphoric mood and sleep disturbance. The patient is not nervous/anxious.    Patient Active Problem List    Diagnosis Date Noted   Ovarian failure 04/09/2017   Basal cell carcinoma, leg 07/15/2016   Abnormal finding on thallium stress test 05/17/2015   Hyperlipidemia associated with type 2 diabetes mellitus ([place]) 04/12/2015   Essential (primary) hypertension 04/12/2015   History of uterine cancer 04/12/2015   Type II diabetes mellitus with complication ([place]) 16/38/4665   Cardiac pacemaker in situ 10/11/2009    No Known Allergies  Past Surgical History:  Procedure Laterality Date   ABDOMINAL HYSTERECTOMY  1994   Uterine CA   APPENDECTOMY     CATARACT EXTRACTION Left 2011   LITHOTRIPSY  2005   PACEMAKER INSERTION  2010    Social History   Tobacco Use   Smoking status: Never   Smokeless tobacco: Never  Vaping Use   Vaping Use: Never used  Substance Use Topics   Alcohol use: No    Alcohol/week: 0.0 standard drinks   Drug use: No     Medication list has been reviewed and updated.  Current Meds  Medication Sig   aspirin 81 MG chewable tablet Chew 1 tablet by mouth daily.   cholecalciferol (VITAMIN D3) 25 MCG (1000 UT) tablet Take 1,000 Units by mouth daily.   glimepiride (AMARYL) 2 MG tablet TAKE 1 TABLET BY MOUTH TWICE DAILY WITH FOOD   glucose blood (ONE TOUCH ULTRA TEST) test strip 1 each by Other route 2 (two) times daily.   metFORMIN (GLUCOPHAGE-XR) 500 MG 24 hr tablet Take 2 tablets (1,000 mg total) by mouth in the morning and at bedtime.   metoprolol succinate (TOPROL-XL) 25 MG 24 hr tablet TAKE 1 TABLET BY MOUTH EVERY DAY   Multiple Vitamins-Minerals (MULTIVITAL) tablet Take 1 tablet by mouth daily.   olmesartan-hydrochlorothiazide (BENICAR HCT) 20-12.5 MG tablet TAKE 1 TABLET BY MOUTH DAILY   omega-3 acid ethyl esters (LOVAZA) 1 g capsule TAKE 4 CAPSULES BY MOUTH EVERY DAY   Semaglutide,0.25 or 0.5MG /DOS, (OZEMPIC, 0.25 OR 0.5 MG/DOSE,) 2 MG/1.5ML SOPN Inject 0.5 mg into the skin once a week.   simvastatin (ZOCOR) 80 MG tablet TAKE 1 TABLET(80 MG) BY MOUTH DAILY    [DISCONTINUED] JANUMET XR 50-1000 MG TB24 Take 2 tablets by mouth daily.    PHQ 2/9 Scores 05/06/2021 12/31/2020 10/01/2020 09/13/2020  PHQ - 2 Score 0 0 0 0  PHQ- 9 Score 1 0 - 0    GAD 7 : Generalized Anxiety Score 05/06/2021 12/31/2020 09/13/2020 05/04/2020  Nervous, Anxious, on Edge 0 0 0 0  Control/stop worrying 0 0 0 0  Worry too much - different things 0 0 0 0  Trouble relaxing 0 0 0 0  Restless 0 0 0 0  Easily annoyed or irritable 0 0 0 0  Afraid - awful might happen 0 0 0 0  Total GAD 7 Score 0 0 0 0  Anxiety Difficulty Not difficult at all - Not difficult at all Not difficult at all    BP Readings from Last 3 Encounters:  05/06/21 122/64  12/31/20 138/68  10/01/20 138/72    Physical Exam Vitals and nursing note reviewed.  Constitutional:      General: She is not in acute distress.    Appearance: She is well-developed.  HENT:     Head: Normocephalic and atraumatic.     Right Ear: Tympanic membrane and ear canal normal.     Left Ear:  Tympanic membrane and ear canal normal.     Nose:     Right Sinus: No maxillary sinus tenderness.     Left Sinus: No maxillary sinus tenderness.  Eyes:     General: No scleral icterus.       Right eye: No discharge.        Left eye: No discharge.     Conjunctiva/sclera: Conjunctivae normal.  Neck:     Thyroid: No thyromegaly.     Vascular: No carotid bruit.  Cardiovascular:     Rate and Rhythm: Normal rate and regular rhythm.     Pulses: Normal pulses.     Heart sounds: Normal heart sounds.  Pulmonary:     Effort: Pulmonary effort is normal. No respiratory distress.     Breath sounds: No wheezing.  Chest:  Breasts:    Right: No mass, nipple discharge, skin change or tenderness.     Left: No mass, nipple discharge, skin change or tenderness.  Abdominal:     General: Bowel sounds are normal.     Palpations: Abdomen is soft.     Tenderness: There is no abdominal tenderness.  Musculoskeletal:     Cervical back: Normal range of  motion. No erythema.     Right lower leg: No edema.     Left lower leg: No edema.  Lymphadenopathy:     Cervical: No cervical adenopathy.  Skin:    General: Skin is warm and dry.     Findings: No rash.  Neurological:     Mental Status: She is alert and oriented to person, place, and time.     Cranial Nerves: No cranial nerve deficit.     Sensory: No sensory deficit.     Deep Tendon Reflexes: Reflexes are normal and symmetric.  Psychiatric:        Attention and Perception: Attention normal.        Mood and Affect: Mood normal.    Wt Readings from Last 3 Encounters:  05/06/21 179 lb (81.2 kg)  12/31/20 186 lb (84.4 kg)  10/01/20 198 lb 3.2 oz (89.9 kg)    BP 122/64   Pulse 63   Temp 97.9 F (36.6 C) (Oral)   Ht 5\' 8"  (1.727 m)   Wt 179 lb (81.2 kg)   SpO2 98%   BMI 27.22 kg/m   Assessment and Plan: 1. Essential (primary) hypertension Clinically stable exam with well controlled BP. Tolerating medications without side effects at this time. Pt to continue current regimen and low sodium diet; benefits of regular exercise as able discussed. - CBC with Differential/Platelet - TSH - POCT urinalysis dipstick  2. Type II diabetes mellitus with complication (HCC) Clinically stable by exam and report without s/s of hypoglycemia. DM complicated by hypertension and dyslipidemia. Tolerating medications well without side effects or other concerns.  She is gradually losing weight due to Ozempic. Eye exam is current - will request report - Comprehensive metabolic panel - Hemoglobin A1c  3. Hyperlipidemia associated with type 2 diabetes mellitus ([place]) Tolerating statin medication without side effects at this time LDL is at goal of < 70 on current dose Continue same therapy without change at this time Simvastatin 80 mg. - Lipid panel  4. Encounter for screening mammogram for breast cancer To be scheduled with DEXA in [facility] - MM 3D SCREEN BREAST BILATERAL; Future  5. Encounter  for screening for osteoporosis Schedule in [facility]   Partially dictated using Dragon software. Any errors are unintentional.  Halina Maidens, MD  [facility]  05/06/2021

## 2021-05-07 LAB — CBC WITH DIFFERENTIAL/PLATELET
Basophils Absolute: 0.1 10*3/uL (ref 0.0–0.2)
Basos: 1 %
EOS (ABSOLUTE): 0.1 10*3/uL (ref 0.0–0.4)
Eos: 1 %
Hematocrit: 40.2 % (ref 34.0–46.6)
Hemoglobin: 13.5 g/dL (ref 11.1–15.9)
Immature Grans (Abs): 0 10*3/uL (ref 0.0–0.1)
Immature Granulocytes: 0 %
Lymphocytes Absolute: 1.4 10*3/uL (ref 0.7–3.1)
Lymphs: 24 %
MCH: 31.3 pg (ref 26.6–33.0)
MCHC: 33.6 g/dL (ref 31.5–35.7)
MCV: 93 fL (ref 79–97)
Monocytes Absolute: 0.4 10*3/uL (ref 0.1–0.9)
Monocytes: 6 %
Neutrophils Absolute: 4.1 10*3/uL (ref 1.4–7.0)
Neutrophils: 68 %
Platelets: 169 10*3/uL (ref 150–450)
RBC: 4.31 x10E6/uL (ref 3.77–5.28)
RDW: 11.9 % (ref 11.7–15.4)
WBC: 6 10*3/uL (ref 3.4–10.8)

## 2021-05-07 LAB — COMPREHENSIVE METABOLIC PANEL
ALT: 20 IU/L (ref 0–32)
AST: 26 IU/L (ref 0–40)
Albumin/Globulin Ratio: 2.2 (ref 1.2–2.2)
Albumin: 4.6 g/dL (ref 3.7–4.7)
Alkaline Phosphatase: 53 IU/L (ref 44–121)
BUN/Creatinine Ratio: 24 (ref 12–28)
BUN: 28 mg/dL — ABNORMAL HIGH (ref 8–27)
Bilirubin Total: 0.6 mg/dL (ref 0.0–1.2)
CO2: 21 mmol/L (ref 20–29)
Calcium: 9.9 mg/dL (ref 8.7–10.3)
Chloride: 101 mmol/L (ref 96–106)
Creatinine, Ser: 1.19 mg/dL — ABNORMAL HIGH (ref 0.57–1.00)
Globulin, Total: 2.1 g/dL (ref 1.5–4.5)
Glucose: 223 mg/dL — ABNORMAL HIGH (ref 65–99)
Potassium: 5 mmol/L (ref 3.5–5.2)
Sodium: 139 mmol/L (ref 134–144)
Total Protein: 6.7 g/dL (ref 6.0–8.5)
eGFR: 48 mL/min/{1.73_m2} — ABNORMAL LOW (ref >59)

## 2021-05-07 LAB — LIPID PANEL
Chol/HDL Ratio: 2.6 ratio (ref 0.0–4.4)
Cholesterol, Total: 121 mg/dL (ref 100–199)
HDL: 47 mg/dL (ref >39)
LDL Chol Calc (NIH): 41 mg/dL (ref 0–99)
Triglycerides: 204 mg/dL — ABNORMAL HIGH (ref 0–149)
VLDL Cholesterol Cal: 33 mg/dL (ref 5–40)

## 2021-05-07 LAB — TSH: TSH: 2.72 u[IU]/mL (ref 0.450–4.500)

## 2021-05-07 LAB — HEMOGLOBIN A1C
Est. average glucose Bld gHb Est-mCnc: 143 mg/dL
Hgb A1c MFr Bld: 6.6 % — ABNORMAL HIGH (ref 4.8–5.6)

## 2021-05-15 ENCOUNTER — Other Ambulatory Visit: Payer: Self-pay

## 2021-05-15 ENCOUNTER — Ambulatory Visit
Admission: RE | Admit: 2021-05-15 | Discharge: 2021-05-15 | Disposition: A | Discharge status: Home / Self Care | Payer: Medicare Other | Source: Ambulatory Visit | Attending: Internal Medicine | Admitting: Internal Medicine

## 2021-05-15 DIAGNOSIS — Z780 Asymptomatic menopausal state: Secondary | ICD-10-CM | POA: Diagnosis not present

## 2021-05-15 DIAGNOSIS — Z1231 Encounter for screening mammogram for malignant neoplasm of breast: Secondary | ICD-10-CM | POA: Insufficient documentation

## 2021-05-17 ENCOUNTER — Other Ambulatory Visit: Payer: Self-pay | Admitting: *Deleted

## 2021-05-17 ENCOUNTER — Inpatient Hospital Stay
Admission: RE | Admit: 2021-05-17 | Discharge: 2021-05-17 | Disposition: A | Discharge status: Home / Self Care | Payer: Self-pay | Source: Ambulatory Visit | Attending: *Deleted | Admitting: *Deleted

## 2021-05-17 DIAGNOSIS — Z1231 Encounter for screening mammogram for malignant neoplasm of breast: Secondary | ICD-10-CM

## 2021-05-19 ENCOUNTER — Encounter: Payer: Self-pay | Admitting: Internal Medicine

## 2021-05-20 ENCOUNTER — Inpatient Hospital Stay
Admission: RE | Admit: 2021-05-20 | Discharge: 2021-05-20 | Disposition: A | Discharge status: Home / Self Care | Payer: Self-pay | Source: Ambulatory Visit | Attending: *Deleted | Admitting: *Deleted

## 2021-05-20 ENCOUNTER — Other Ambulatory Visit: Payer: Self-pay | Admitting: *Deleted

## 2021-05-20 DIAGNOSIS — Z1231 Encounter for screening mammogram for malignant neoplasm of breast: Secondary | ICD-10-CM

## 2021-05-27 ENCOUNTER — Telehealth: Payer: Self-pay

## 2021-05-27 NOTE — Chronic Care Management (AMB) (Signed)
    Chronic Care Management Pharmacy Assistant   Name: Kimberly Salazar  MRN: [id number] DOB: 1945/11/02  Reason for Encounter: General Adherence Call  Recent office visits:  05/06/21- Halina Maidens, MD- seen for annual exam, bone density ordered, mammogram ordered,  labs ordered, no medication changes, follow up 4 months  Recent consult visits:  No visits noted  Hospital visits:  None in previous 6 months  Medications: Outpatient Encounter Medications as of 05/27/2021  Medication Sig   aspirin 81 MG chewable tablet Chew 1 tablet by mouth daily.   cholecalciferol (VITAMIN D3) 25 MCG (1000 UT) tablet Take 1,000 Units by mouth daily.   glimepiride (AMARYL) 2 MG tablet TAKE 1 TABLET BY MOUTH TWICE DAILY WITH FOOD   glucose blood (ONE TOUCH ULTRA TEST) test strip 1 each by Other route 2 (two) times daily.   metFORMIN (GLUCOPHAGE-XR) 500 MG 24 hr tablet Take 2 tablets (1,000 mg total) by mouth in the morning and at bedtime.   metoprolol succinate (TOPROL-XL) 25 MG 24 hr tablet TAKE 1 TABLET BY MOUTH EVERY DAY   Multiple Vitamins-Minerals (MULTIVITAL) tablet Take 1 tablet by mouth daily.   olmesartan-hydrochlorothiazide (BENICAR HCT) 20-12.5 MG tablet TAKE 1 TABLET BY MOUTH DAILY   omega-3 acid ethyl esters (LOVAZA) 1 g capsule TAKE 4 CAPSULES BY MOUTH EVERY DAY   Semaglutide,0.25 or 0.'5MG'$ /DOS, (OZEMPIC, 0.25 OR 0.5 MG/DOSE,) 2 MG/1.5ML SOPN Inject 0.5 mg into the skin once a week.   simvastatin (ZOCOR) 80 MG tablet TAKE 1 TABLET(80 MG) BY MOUTH DAILY   No facility-administered encounter medications on file as of 05/27/2021.   Have you had any problems recently with your health? Patient stated she has not had any recent problems    Have you had any problems with your pharmacy? Patient stated she is now in the donut hole and has to pay $235 for Ozempic. She was previously sent a patient assistance application and will be completing  it before her next dose is due for refill. She will be  taking her last pen on Thursday  What issues or side effects are you having with your medications? Patient stated she is not having any issues with medications  What would you like me to pass along to [facility] for them to help you with?  Patient stated she had nothing to pass along  What can we do to take care of you better? Patient had no suggestions to offer  Star Rating Drugs: Glimepiride 2 mg - 90 DS last filled 03/05/21 Metformin 500 mg- 90 DS last filled 02/28/21 Olmesartan- HCTZ 20-12.5 mg - 90 DS last filled 04/05/21 Ozempic 2 mg/1.5 mL- 28 DS last filled 05/06/21 Simvastatin 80 mg  - 90 DS last filled 04/05/21  CCM follow up phone visit scheduled for 09/06 at 10:45 am  [place], CMA

## 2021-05-28 ENCOUNTER — Other Ambulatory Visit: Payer: Self-pay | Admitting: Internal Medicine

## 2021-05-28 DIAGNOSIS — E118 Type 2 diabetes mellitus with unspecified complications: Secondary | ICD-10-CM

## 2021-06-04 ENCOUNTER — Other Ambulatory Visit: Payer: Self-pay | Admitting: Internal Medicine

## 2021-07-02 ENCOUNTER — Telehealth: Payer: Self-pay

## 2021-07-02 NOTE — Telephone Encounter (Signed)
  Chronic Care Management  Outreach Note   Name: Kimberly Salazar MRN: [id number] DOB: Nov 01, 1945  Referred by: Glean Hess, MD Reason for referral: Telephone Appointment with Clinical Pharmacist, Madelin Rear.   An unsuccessful telephone outreach was attempted today. The patient was referred to the pharmacist for assistance with care management and care coordination.   Telephone appointment with clinical pharmacist today (07/02/2021) at 1045am. If patient immediately returns call, transfer to [phone]. Otherwise, please provide this number so patient can reschedule visit.   Madelin Rear, PharmD, CPP Clinical Pharmacist Practitioner  [phone]

## 2021-07-04 ENCOUNTER — Other Ambulatory Visit: Payer: Self-pay | Admitting: Internal Medicine

## 2021-07-04 DIAGNOSIS — I10 Essential (primary) hypertension: Secondary | ICD-10-CM

## 2021-07-04 NOTE — Telephone Encounter (Signed)
Requested Prescriptions  Pending Prescriptions Disp Refills  . olmesartan-hydrochlorothiazide (BENICAR HCT) 20-12.5 MG tablet [Pharmacy Med Name: OLMESARTAN MEDOX/HCTZ 20-12.'5MG'$  TAB] 90 tablet 1    Sig: TAKE 1 TABLET BY MOUTH DAILY     Cardiovascular: ARB + Diuretic Combos Failed - 07/04/2021  6:16 AM      Failed - Cr in normal range and within 180 days    Creatinine, Ser  Date Value Ref Range Status  05/06/2021 1.19 (H) 0.57 - 1.00 mg/dL Final         Passed - K in normal range and within 180 days    Potassium  Date Value Ref Range Status  05/06/2021 5.0 3.5 - 5.2 mmol/L Final         Passed - Na in normal range and within 180 days    Sodium  Date Value Ref Range Status  05/06/2021 139 134 - 144 mmol/L Final         Passed - Ca in normal range and within 180 days    Calcium  Date Value Ref Range Status  05/06/2021 9.9 8.7 - 10.3 mg/dL Final         Passed - Patient is not pregnant      Passed - Last BP in normal range    BP Readings from Last 1 Encounters:  05/06/21 122/64         Passed - Valid encounter within last 6 months    Recent Outpatient Visits          1 month ago Essential (primary) hypertension   [facility], [facility]   6 months ago Type II diabetes mellitus with complication [facility])   [facility] Glean [facility], MD   9 months ago Type II diabetes mellitus with complication [facility])   [facility], MD   1 year ago Essential (primary) hypertension   [facility] Glean Hess, MD   1 year ago Type II diabetes mellitus with complication [facility])   [facility] Gle[facility], [facility]      Future Appointments            In 2 months Army Melia Jesse Sans, MD [facility], [place]   In 10 months Glean Hess, MD [facility], [place]           . simvastatin (ZOCOR) 80 MG tablet [Pharmacy Med Name: SIMVASTATIN '80MG'$  TABLETS] 90 tablet 0    Sig: TAKE 1 TABLET(80 MG)  BY MOUTH DAILY     Cardiovascular:  Antilipid - Statins Failed - 07/04/2021  6:16 AM      Failed - Triglycerides in normal range and within 360 days    Triglycerides  Date Value Ref Range Status  05/06/2021 204 (H) 0 - 149 mg/dL Final         Passed - Total Cholesterol in normal range and within 360 days    Cholesterol, Total  Date Value Ref Range Status  05/06/2021 121 100 - 199 mg/dL Final         Passed - LDL in normal range and within 360 days    LDL Chol Calc (NIH)  Date Value Ref Range Status  05/06/2021 41 0 - 99 mg/dL Final         Passed - HDL in normal range and within 360 days    HDL  Date Value Ref Range Status  05/06/2021 47 >39 mg/dL Final  Passed - Patient is not pregnant      Passed - Valid encounter within last 12 months    Recent Outpatient Visits          1 month ago Essential (primary) hypertension   [facility], [facility]   6 months ago Type II diabetes mellitus with complication [facility])   [facility] Glean Hess, MD   9 months ago Type II diabetes mellitus with complication [facility])   [facility] Glean Hess, MD   1 year ago Essential (primary) hypertension   [facility] Glean Hess, MD   1 year ago Type II diabetes mellitus with complication [facility])   [facility] Glean Hess, MD      Future Appointments            In 2 months Army Melia Jesse Sans, MD [facility], [place]   In 10 months Army Melia Jesse Sans, MD [facility], [facility]

## 2021-07-08 ENCOUNTER — Other Ambulatory Visit: Payer: Self-pay | Admitting: Internal Medicine

## 2021-07-20 IMAGING — MG MM DIGITAL SCREENING BILAT W/ TOMO AND CAD
6 of 10 series · 6 of 30 positions shown · non-contrast
Comparison: Previous exam(s).

CLINICAL DATA: Screening.

EXAM:
DIGITAL SCREENING BILATERAL MAMMOGRAM WITH TOMOSYNTHESIS AND CAD
TECHNIQUE: Bilateral screening digital craniocaudal and mediolateral oblique
mammograms were obtained. Bilateral screening digital breast
tomosynthesis was performed. The images were evaluated with
computer-aided detection.

[R MLO synth-2D]
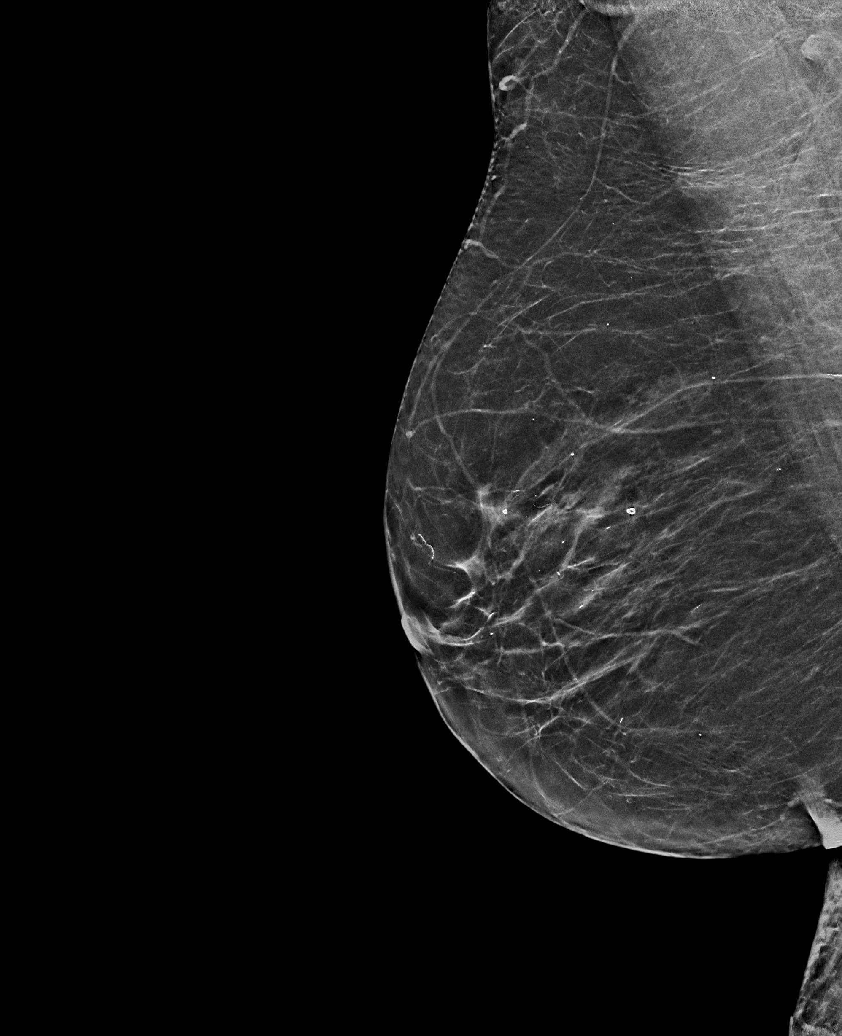

[R CC synth-2D]
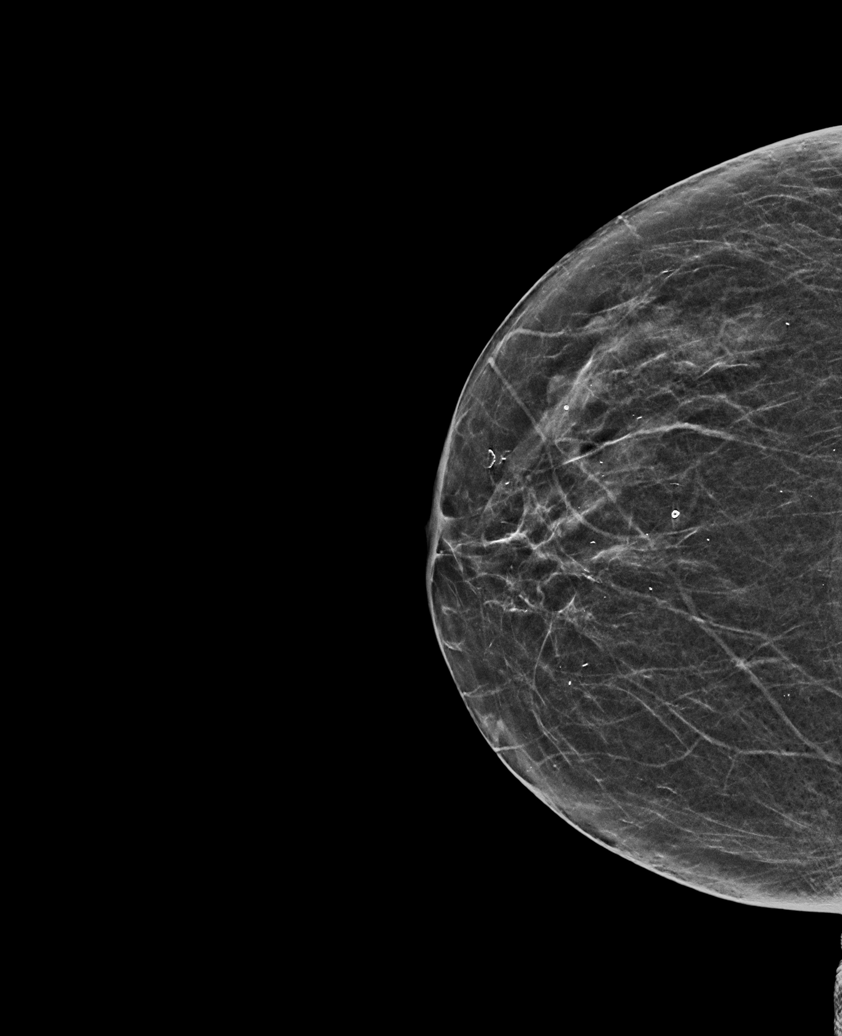

[L CC synth-2D]
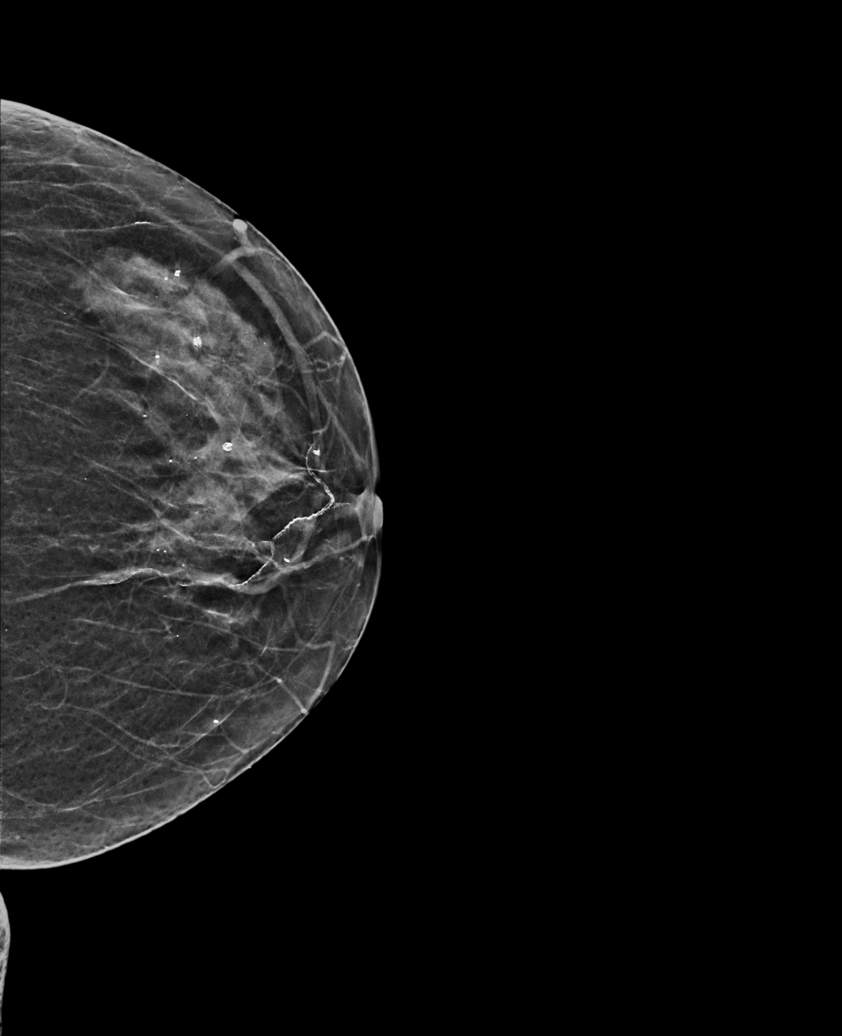

[L MLO synth-2D (1 of 2)]
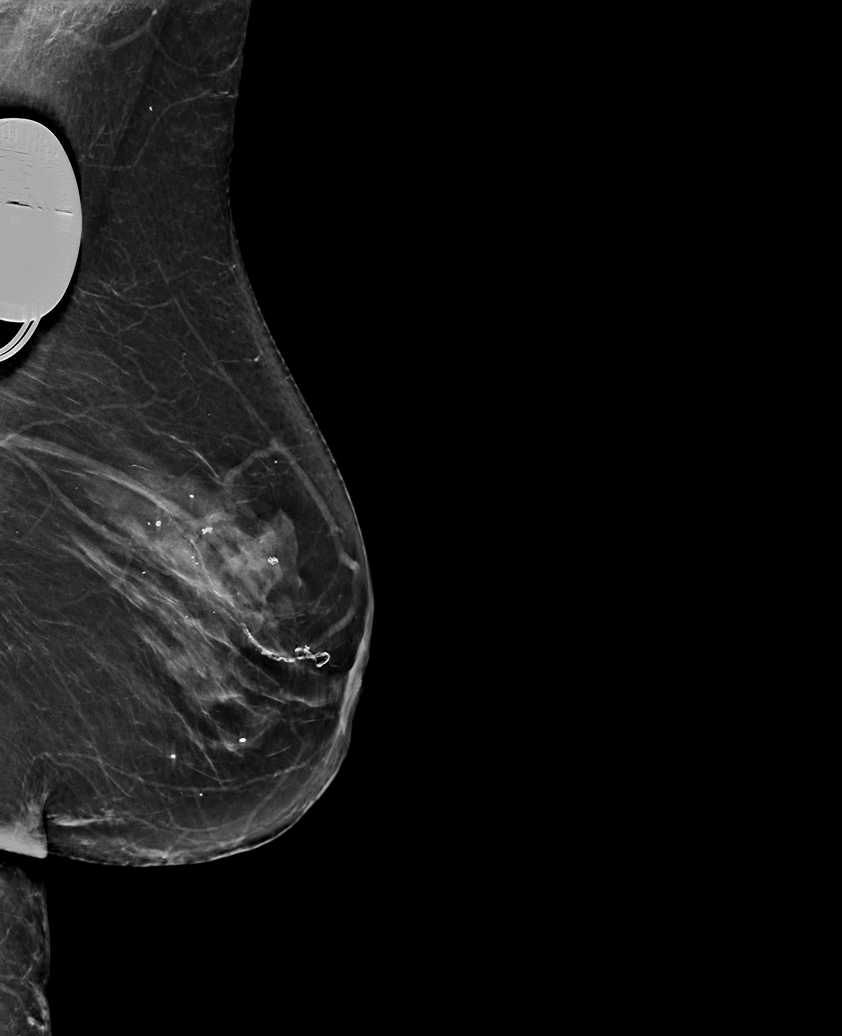

[L MLO synth-2D (2 of 2)]
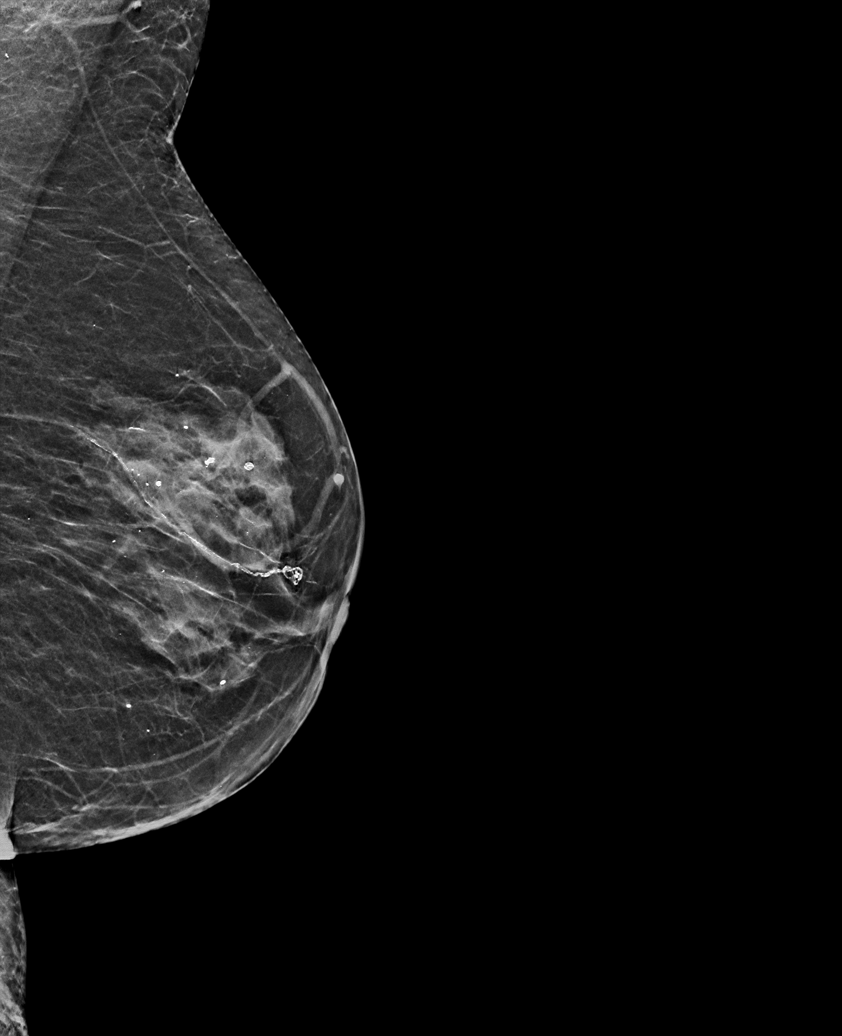

[R CC tomo · tomo slice 25/50.0]
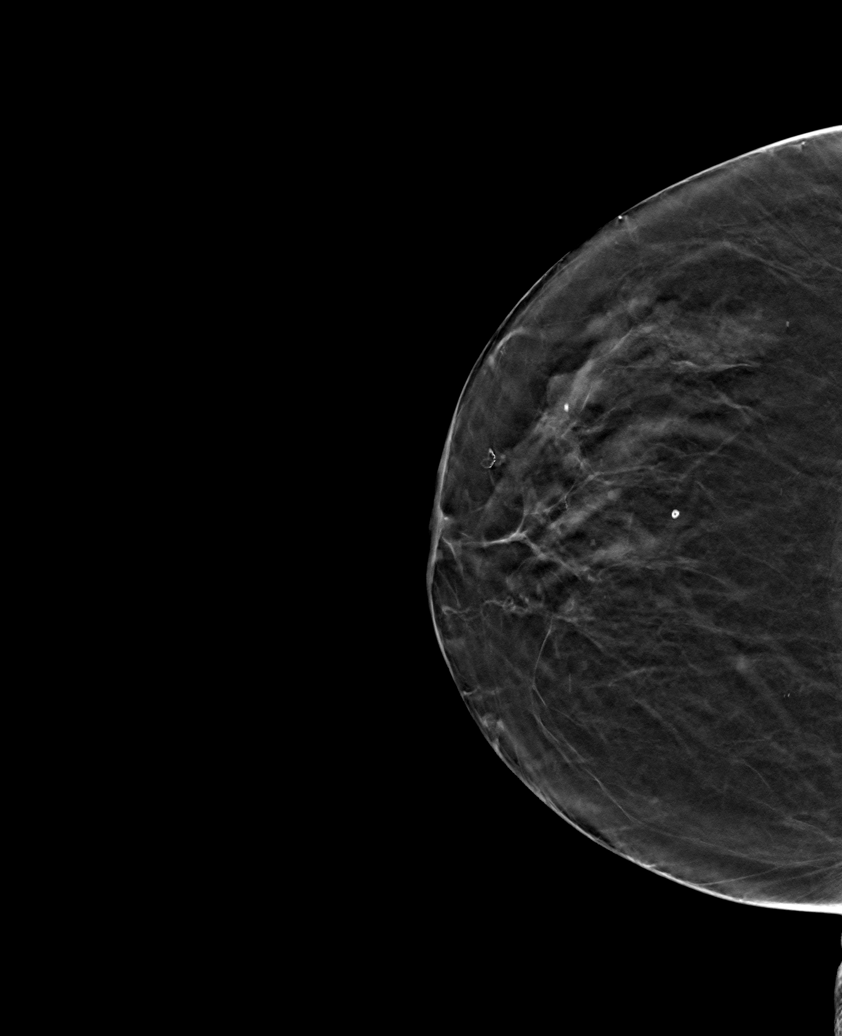

[6 of 30 positions shown; findings below may reference images not displayed]

ACR Breast Density Category c: The breast tissue is heterogeneously
dense, which may obscure small masses.
FINDINGS: There are no findings suspicious for malignancy.
IMPRESSION: No mammographic evidence of malignancy. A result letter of this
screening mammogram will be mailed directly to the patient.

RECOMMENDATION:
Screening mammogram in one year. (Code:[id number])

BI-RADS CATEGORY  1: Negative.

## 2021-08-30 ENCOUNTER — Other Ambulatory Visit: Payer: Self-pay | Admitting: Internal Medicine

## 2021-08-30 DIAGNOSIS — E118 Type 2 diabetes mellitus with unspecified complications: Secondary | ICD-10-CM

## 2021-08-31 NOTE — Telephone Encounter (Signed)
Requested Prescriptions  Pending Prescriptions Disp Refills  . glimepiride (AMARYL) 2 MG tablet [Pharmacy Med Name: GLIMEPIRIDE 2MG TABLETS] 180 tablet 0    Sig: TAKE 1 TABLET BY MOUTH TWICE DAILY WITH FOOD     Endocrinology:  Diabetes - Sulfonylureas Passed - 08/30/2021  7:20 PM      Passed - HBA1C is between 0 and 7.9 and within 180 days    Hgb A1c MFr Bld  Date Value Ref Range Status  05/06/2021 6.6 (H) 4.8 - 5.6 % Final    Comment:             Prediabetes: 5.7 - 6.4          Diabetes: >6.4          Glycemic control for adults with diabetes: <7.0          Passed - Valid encounter within last 6 months    Recent Outpatient Visits          3 months ago Essential (primary) hypertension   [facility]   8 months ago Type II diabetes mellitus with complication [facility])   [facility] Gle[facility], [facility]   11 months ago Type II diabetes mellitus with complication [facility])   [facility] Glean [facility], MD   1 year ago Essential (primary) hypertension   [facility] Gle[facility], [facility]   1 year ago Type II diabetes mellitus with complication [facility])   [facility] Glean Hess, MD      Future Appointments            In 1 week Glean Hess, MD [facility], [place]   In 8 months Glean Hess, MD [facility], [facility]           . metFORMIN (GLUCOPHAGE-XR) 500 MG 24 hr tablet [Pharmacy Med Name: METFORMIN ER 500MG 24HR TABS] 120 tablet 2    Sig: TAKE 2 TABLETS(1000 MG) BY MOUTH IN THE MORNING AND AT BEDTIME     Endocrinology:  Diabetes - Biguanides Failed - 08/30/2021  7:20 PM      Failed - Cr in normal range and within 360 days    Creatinine, Ser  Date Value Ref Range Status  05/06/2021 1.19 (H) 0.57 - 1.00 mg/dL Final         Failed - eGFR in normal range and within 360 days    GFR calc Af Amer  Date Value Ref Range Status  09/13/2020 66 >59 mL/min/1.73 Final    Comment:     **In accordance with recommendations from the NKF-ASN Task force,**   [facility] is in the process of updating its eGFR calculation to the   2021 CKD-EPI creatinine equation that estimates kidney function   without a race variable.    GFR calc non Af Amer  Date Value Ref Range Status  09/13/2020 57 (L) >59 mL/min/1.73 Final   eGFR  Date Value Ref Range Status  05/06/2021 48 (L) >59 mL/min/1.73 Final         Passed - HBA1C is between 0 and 7.9 and within 180 days    Hgb A1c MFr Bld  Date Value Ref Range Status  05/06/2021 6.6 (H) 4.8 - 5.6 % Final    Comment:             Prediabetes: 5.7 - 6.4          Diabetes: >6.4  Glycemic control for adults with diabetes: <7.0          Passed - Valid encounter within last 6 months    Recent Outpatient Visits          3 months ago Essential (primary) hypertension   [facility] Glean Hess, MD   8 months ago Type II diabetes mellitus with complication [facility])   [facility] Glean Hess, MD   11 months ago Type II diabetes mellitus with complication [facility])   [facility] Glean Hess, MD   1 year ago Essential (primary) hypertension   [facility] Glean [facility], MD   1 year ago Type II diabetes mellitus with complication [facility])   [facility] Glean Hess, MD      Future Appointments            In 1 week Army Melia Jesse Sans, MD [facility], [place]   In 8 months Glean Hess, MD [facility], [facility]           . metoprolol succinate (TOPROL-XL) 25 MG 24 hr tablet [Pharmacy Med Name: METOPROLOL ER SUCCINATE 25MG TABS] 90 tablet 0    Sig: TAKE 1 TABLET BY MOUTH EVERY DAY     Cardiovascular:  Beta Blockers Passed - 08/30/2021  7:20 PM      Passed - Last BP in normal range    BP Readings from Last 1 Encounters:  05/06/21 122/64         Passed - Last Heart Rate in normal range    Pulse Readings from Last 1 Encounters:  05/06/21 63          Passed - Valid encounter within last 6 months    Recent Outpatient Visits          3 months ago Essential (primary) hypertension   [facility] Glean [facility], MD   8 months ago Type II diabetes mellitus with complication [facility])   [facility] Glean Hess, MD   11 months ago Type II diabetes mellitus with complication [facility])   [facility] Glean Hess, MD   1 year ago Essential (primary) hypertension   [facility] Glean Hess, MD   1 year ago Type II diabetes mellitus with complication [facility])   [facility] Glean Hess, MD      Future Appointments            In 1 week Army Melia Jesse Sans, MD [facility], [place]   In 8 months Army Melia, Jesse Sans, MD [facility], [facility]

## 2021-09-10 ENCOUNTER — Ambulatory Visit (INDEPENDENT_AMBULATORY_CARE_PROVIDER_SITE_OTHER): Payer: Medicare Other | Admitting: Internal Medicine

## 2021-09-10 ENCOUNTER — Other Ambulatory Visit: Payer: Self-pay

## 2021-09-10 ENCOUNTER — Encounter: Payer: Self-pay | Admitting: Internal Medicine

## 2021-09-10 VITALS — BP 128/74 | HR 74 | Ht 68.0 in | Wt 182.4 lb

## 2021-09-10 DIAGNOSIS — Z23 Encounter for immunization: Secondary | ICD-10-CM | POA: Diagnosis not present

## 2021-09-10 DIAGNOSIS — I10 Essential (primary) hypertension: Secondary | ICD-10-CM | POA: Diagnosis not present

## 2021-09-10 DIAGNOSIS — E118 Type 2 diabetes mellitus with unspecified complications: Secondary | ICD-10-CM

## 2021-09-10 LAB — POCT GLYCOSYLATED HEMOGLOBIN (HGB A1C): Hemoglobin A1C: 6.3 % — AB (ref 4.0–5.6)

## 2021-09-10 NOTE — Progress Notes (Signed)
Date:  09/10/2021   Name:  Kimberly Salazar   DOB:  11-27-1945   MRN:  [id number]   Chief Complaint: Hypertension and Diabetes  Diabetes She presents for her follow-up diabetic visit. She has type 2 diabetes mellitus. Her disease course has been stable. Pertinent negatives for hypoglycemia include no headaches or tremors. Pertinent negatives for diabetes include no chest pain, no fatigue, no polydipsia and no polyuria. Pertinent negatives for diabetic complications include no CVA. Current diabetic treatments: ozempic, metformin, glimepiride. Her breakfast blood glucose is taken between 6-7 am. Her breakfast blood glucose range is generally 130-140 mg/dl. An ACE inhibitor/angiotensin II receptor blocker is being taken.  Hypertension This is a chronic problem. The problem is controlled. Pertinent negatives include no chest pain, headaches, palpitations or shortness of breath. Past treatments include beta blockers, angiotensin blockers and diuretics. There is no history of kidney disease, CAD/MI or CVA.   Lab Results  Component Value Date   CREATININE 1.19 (H) 05/06/2021   BUN 28 (H) 05/06/2021   NA 139 05/06/2021   K 5.0 05/06/2021   CL 101 05/06/2021   CO2 21 05/06/2021   Lab Results  Component Value Date   CHOL 121 05/06/2021   HDL 47 05/06/2021   LDLCALC 41 05/06/2021   TRIG 204 (H) 05/06/2021   CHOLHDL 2.6 05/06/2021   Lab Results  Component Value Date   TSH 2.720 05/06/2021   Lab Results  Component Value Date   HGBA1C 6.6 (H) 05/06/2021   Lab Results  Component Value Date   WBC 6.0 05/06/2021   HGB 13.5 05/06/2021   HCT 40.2 05/06/2021   MCV 93 05/06/2021   PLT 169 05/06/2021   Lab Results  Component Value Date   ALT 20 05/06/2021   AST 26 05/06/2021   ALKPHOS 53 05/06/2021   BILITOT 0.6 05/06/2021     Review of Systems  Constitutional:  Negative for appetite change, fatigue, fever and unexpected weight change.  HENT:  Negative for tinnitus and trouble  swallowing.   Eyes:  Negative for visual disturbance.  Respiratory:  Negative for cough, chest tightness and shortness of breath.   Cardiovascular:  Negative for chest pain, palpitations and leg swelling.  Gastrointestinal:  Negative for abdominal pain.  Endocrine: Negative for polydipsia and polyuria.  Genitourinary:  Negative for dysuria and hematuria.  Musculoskeletal:  Negative for arthralgias.  Neurological:  Negative for tremors, numbness and headaches.  Psychiatric/Behavioral:  Negative for dysphoric mood.    Patient Active Problem List   Diagnosis Date Noted   Ovarian failure 04/09/2017   Basal cell carcinoma, leg 07/15/2016   Abnormal finding on thallium stress test 05/17/2015   Hyperlipidemia associated with type 2 diabetes mellitus ([place]) 04/12/2015   Essential (primary) hypertension 04/12/2015   History of uterine cancer 04/12/2015   Type II diabetes mellitus with complication ([place]) 57/84/6962   Cardiac pacemaker in situ 10/11/2009    No Known Allergies  Past Surgical History:  Procedure Laterality Date   ABDOMINAL HYSTERECTOMY  1994   Uterine CA   APPENDECTOMY     CATARACT EXTRACTION Left 2011   LITHOTRIPSY  2005   PACEMAKER INSERTION  2010    Social History   Tobacco Use   Smoking status: Never   Smokeless tobacco: Never  Vaping Use   Vaping Use: Never used  Substance Use Topics   Alcohol use: No    Alcohol/week: 0.0 standard drinks   Drug use: No     Medication list has  been reviewed and updated.  Current Meds  Medication Sig   aspirin 81 MG chewable tablet Chew 1 tablet by mouth daily.   cholecalciferol (VITAMIN D3) 25 MCG (1000 UT) tablet Take 1,000 Units by mouth daily.   glimepiride (AMARYL) 2 MG tablet TAKE 1 TABLET BY MOUTH TWICE DAILY WITH FOOD   glucose blood (ONE TOUCH ULTRA TEST) test strip 1 each by Other route 2 (two) times daily.   metFORMIN (GLUCOPHAGE-XR) 500 MG 24 hr tablet TAKE 2 TABLETS(1000 MG) BY MOUTH IN THE MORNING AND AT  BEDTIME   metoprolol succinate (TOPROL-XL) 25 MG 24 hr tablet TAKE 1 TABLET BY MOUTH EVERY DAY   Multiple Vitamins-Minerals (MULTIVITAL) tablet Take 1 tablet by mouth daily.   olmesartan-hydrochlorothiazide (BENICAR HCT) 20-12.5 MG tablet TAKE 1 TABLET BY MOUTH DAILY   omega-3 acid ethyl esters (LOVAZA) 1 g capsule TAKE 4 CAPSULES BY MOUTH EVERY DAY   OZEMPIC, 0.25 OR 0.5 MG/DOSE, 2 MG/1.5ML SOPN INJECT 0.5 MG INTO THE SKIN ONCE A WEEK   simvastatin (ZOCOR) 80 MG tablet TAKE 1 TABLET(80 MG) BY MOUTH DAILY    PHQ 2/9 Scores 09/10/2021 05/06/2021 12/31/2020 10/01/2020  PHQ - 2 Score 0 0 0 0  PHQ- 9 Score 0 1 0 -    GAD 7 : Generalized Anxiety Score 09/10/2021 05/06/2021 12/31/2020 09/13/2020  Nervous, Anxious, on Edge 0 0 0 0  Control/stop worrying 0 0 0 0  Worry too much - different things 0 0 0 0  Trouble relaxing 0 0 0 0  Restless 0 0 0 0  Easily annoyed or irritable 0 0 0 0  Afraid - awful might happen 0 0 0 0  Total GAD 7 Score 0 0 0 0  Anxiety Difficulty Not difficult at all Not difficult at all - Not difficult at all    BP Readings from Last 3 Encounters:  09/10/21 128/74  05/06/21 122/64  12/31/20 138/68    Physical Exam Vitals and nursing note reviewed.  Constitutional:      General: She is not in acute distress.    Appearance: Normal appearance. She is well-developed.  HENT:     Head: Normocephalic and atraumatic.  Neck:     Vascular: No carotid bruit.  Cardiovascular:     Rate and Rhythm: Normal rate and regular rhythm.     Pulses: Normal pulses.  Pulmonary:     Effort: Pulmonary effort is normal. No respiratory distress.     Breath sounds: No wheezing or rhonchi.  Musculoskeletal:     Cervical back: Normal range of motion.     Right lower leg: No edema.     Left lower leg: No edema.  Lymphadenopathy:     Cervical: No cervical adenopathy.  Skin:    General: Skin is warm and dry.     Capillary Refill: Capillary refill takes less than 2 seconds.     Findings:  No rash.  Neurological:     General: No focal deficit present.     Mental Status: She is alert and oriented to person, place, and time.  Psychiatric:        Mood and Affect: Mood normal.        Behavior: Behavior normal.    Wt Readings from Last 3 Encounters:  09/10/21 182 lb 6.4 oz (82.7 kg)  05/06/21 179 lb (81.2 kg)  12/31/20 186 lb (84.4 kg)    BP 128/74   Pulse 74   Ht 5\' 8"  (1.727 m)   Wt  182 lb 6.4 oz (82.7 kg)   BMI 27.73 kg/m   Assessment and Plan: 1. Type II diabetes mellitus with complication (HCC) Clinically stable by exam and report without s/s of hypoglycemia. DM complicated by hypertension and dyslipidemia. Tolerating medications well without side effects or other concerns. Sample of Ozempic given - POCT glycosylated hemoglobin (Hb A1C)  2. Essential (primary) hypertension Clinically stable exam with well controlled BP. Tolerating medications without side effects at this time. Pt to continue current regimen and low sodium diet; benefits of regular exercise as able discussed.   Partially dictated using Editor, commissioning. Any errors are unintentional.  Halina Maidens, MD [facility]  09/10/2021

## 2021-10-02 ENCOUNTER — Ambulatory Visit (INDEPENDENT_AMBULATORY_CARE_PROVIDER_SITE_OTHER): Payer: Medicare Other

## 2021-10-02 ENCOUNTER — Other Ambulatory Visit: Payer: Self-pay | Admitting: Internal Medicine

## 2021-10-02 DIAGNOSIS — Z0000 Encounter for general adult medical examination without abnormal findings: Secondary | ICD-10-CM

## 2021-10-02 NOTE — Telephone Encounter (Signed)
Requested Prescriptions  Pending Prescriptions Disp Refills  . simvastatin (ZOCOR) 80 MG tablet [Pharmacy Med Name: SIMVASTATIN 80MG  TABLETS] 90 tablet 0    Sig: TAKE 1 TABLET(80 MG) BY MOUTH DAILY     Cardiovascular:  Antilipid - Statins Failed - 10/02/2021  6:14 AM      Failed - Triglycerides in normal range and within 360 days    Triglycerides  Date Value Ref Range Status  05/06/2021 204 (H) 0 - 149 mg/dL Final         Passed - Total Cholesterol in normal range and within 360 days    Cholesterol, Total  Date Value Ref Range Status  05/06/2021 121 100 - 199 mg/dL Final         Passed - LDL in normal range and within 360 days    LDL Chol Calc (NIH)  Date Value Ref Range Status  05/06/2021 41 0 - 99 mg/dL Final         Passed - HDL in normal range and within 360 days    HDL  Date Value Ref Range Status  05/06/2021 47 >39 mg/dL Final         Passed - Patient is not pregnant      Passed - Valid encounter within last 12 months    Recent Outpatient Visits          3 weeks ago Type II diabetes mellitus with complication [facility])   [facility] Glean Hess, MD   4 months ago Essential (primary) hypertension   [facility] Glean Hess, MD   9 months ago Type II diabetes mellitus with complication [facility])   [facility] Glean Hess, MD   1 year ago Type II diabetes mellitus with complication [facility])   [facility] Glean Hess, MD   1 year ago Essential (primary) hypertension   Stanley, Laura H, MD      Future Appointments            In 3 months Army Melia Jesse Sans, MD [facility], [place]   In 7 months Army Melia, Jesse Sans, MD [facility], [facility]

## 2021-10-02 NOTE — Patient Instructions (Signed)
Ms. Kimberly Salazar , Thank you for taking time to come for your Medicare Wellness Visit. I appreciate your ongoing commitment to your health goals. Please review the following plan we discussed and let me know if I can assist you in the future.   Screening recommendations/referrals: Colonoscopy: done 05/25/18. Repeat 04/2023 Mammogram: done 05/15/21 Bone Density: done 05/15/21 Recommended yearly ophthalmology/optometry visit for glaucoma screening and checkup Recommended yearly dental visit for hygiene and checkup  Vaccinations: Influenza vaccine: done 09/10/21 Pneumococcal vaccine: done 09/15/14 Tdap vaccine: due Shingles vaccine: Shingrix discussed. Please contact your pharmacy for coverage information.  Covid-19:done 12/21/20 & 01/18/21  Advanced directives: Please bring a copy of your health care power of attorney and living will to the office at your convenience.   Conditions/risks identified: Recommend increasing physical activity to at least 3 days per week   Next appointment: Follow up in one year for your annual wellness visit    Preventive Care 65 Years and Older, Female Preventive care refers to lifestyle choices and visits with your health care provider that can promote health and wellness. What does preventive care include? A yearly physical exam. This is also called an annual well check. Dental exams once or twice a year. Routine eye exams. Ask your health care provider how often you should have your eyes checked. Personal lifestyle choices, including: Daily care of your teeth and gums. Regular physical activity. Eating a healthy diet. Avoiding tobacco and drug use. Limiting alcohol use. Practicing safe sex. Taking low-dose aspirin every day. Taking vitamin and mineral supplements as recommended by your health care provider. What happens during an annual well check? The services and screenings done by your health care provider during your annual well check will depend on  your age, overall health, lifestyle risk factors, and family history of disease. Counseling  Your health care provider may ask you questions about your: Alcohol use. Tobacco use. Drug use. Emotional well-being. Home and relationship well-being. Sexual activity. Eating habits. History of falls. Memory and ability to understand (cognition). Work and work Statistician. Reproductive health. Screening  You may have the following tests or measurements: Height, weight, and BMI. Blood pressure. Lipid and cholesterol levels. These may be checked every 5 years, or more frequently if you are over 29 years old. Skin check. Lung cancer screening. You may have this screening every year starting at age 57 if you have a 30-pack-year history of smoking and currently smoke or have quit within the past 15 years. Fecal occult blood test (FOBT) of the stool. You may have this test every year starting at age 33. Flexible sigmoidoscopy or colonoscopy. You may have a sigmoidoscopy every 5 years or a colonoscopy every 10 years starting at age 95. Hepatitis C blood test. Hepatitis B blood test. Sexually transmitted disease (STD) testing. Diabetes screening. This is done by checking your blood sugar (glucose) after you have not eaten for a while (fasting). You may have this done every 1-3 years. Bone density scan. This is done to screen for osteoporosis. You may have this done starting at age 30. Mammogram. This may be done every 1-2 years. Talk to your health care provider about how often you should have regular mammograms. Talk with your health care provider about your test results, treatment options, and if necessary, the need for more tests. Vaccines  Your health care provider may recommend certain vaccines, such as: Influenza vaccine. This is recommended every year. Tetanus, diphtheria, and acellular pertussis (Tdap, Td) vaccine. You may need a Td  booster every 10 years. Zoster vaccine. You may need this  after age 52. Pneumococcal 13-valent conjugate (PCV13) vaccine. One dose is recommended after age 45. Pneumococcal polysaccharide (PPSV23) vaccine. One dose is recommended after age 64. Talk to your health care provider about which screenings and vaccines you need and how often you need them. This information is not intended to replace advice given to you by your health care provider. Make sure you discuss any questions you have with your health care provider. Document Released: 11/09/2015 Document Revised: 07/02/2016 Document Reviewed: 08/14/2015 Elsevier Interactive Patient Education  2017 [place] Prevention in the Home Falls can cause injuries. They can happen to people of all ages. There are many things you can do to make your home safe and to help prevent falls. What can I do on the outside of my home? Regularly fix the edges of walkways and driveways and fix any cracks. Remove anything that might make you trip as you walk through a door, such as a raised step or threshold. Trim any bushes or trees on the path to your home. Use bright outdoor lighting. Clear any walking paths of anything that might make someone trip, such as rocks or tools. Regularly check to see if handrails are loose or broken. Make sure that both sides of any steps have handrails. Any raised decks and porches should have guardrails on the edges. Have any leaves, snow, or ice cleared regularly. Use sand or salt on walking paths during winter. Clean up any spills in your garage right away. This includes oil or grease spills. What can I do in the bathroom? Use night lights. Install grab bars by the toilet and in the tub and shower. Do not use towel bars as grab bars. Use non-skid mats or decals in the tub or shower. If you need to sit down in the shower, use a plastic, non-slip stool. Keep the floor dry. Clean up any water that spills on the floor as soon as it happens. Remove soap buildup in the tub or  shower regularly. Attach bath mats securely with double-sided non-slip rug tape. Do not have throw rugs and other things on the floor that can make you trip. What can I do in the bedroom? Use night lights. Make sure that you have a light by your bed that is easy to reach. Do not use any sheets or blankets that are too big for your bed. They should not hang down onto the floor. Have a firm chair that has side arms. You can use this for support while you get dressed. Do not have throw rugs and other things on the floor that can make you trip. What can I do in the kitchen? Clean up any spills right away. Avoid walking on wet floors. Keep items that you use a lot in easy-to-reach places. If you need to reach something above you, use a strong step stool that has a grab bar. Keep electrical cords out of the way. Do not use floor polish or wax that makes floors slippery. If you must use wax, use non-skid floor wax. Do not have throw rugs and other things on the floor that can make you trip. What can I do with my stairs? Do not leave any items on the stairs. Make sure that there are handrails on both sides of the stairs and use them. Fix handrails that are broken or loose. Make sure that handrails are as long as the stairways. Check any carpeting  to make sure that it is firmly attached to the stairs. Fix any carpet that is loose or worn. Avoid having throw rugs at the top or bottom of the stairs. If you do have throw rugs, attach them to the floor with carpet tape. Make sure that you have a light switch at the top of the stairs and the bottom of the stairs. If you do not have them, ask someone to add them for you. What else can I do to help prevent falls? Wear shoes that: Do not have high heels. Have rubber bottoms. Are comfortable and fit you well. Are closed at the toe. Do not wear sandals. If you use a stepladder: Make sure that it is fully opened. Do not climb a closed stepladder. Make  sure that both sides of the stepladder are locked into place. Ask someone to hold it for you, if possible. Clearly mark and make sure that you can see: Any grab bars or handrails. First and last steps. Where the edge of each step is. Use tools that help you move around (mobility aids) if they are needed. These include: Canes. Walkers. Scooters. Crutches. Turn on the lights when you go into a dark area. Replace any light bulbs as soon as they burn out. Set up your furniture so you have a clear path. Avoid moving your furniture around. If any of your floors are uneven, fix them. If there are any pets around you, be aware of where they are. Review your medicines with your doctor. Some medicines can make you feel dizzy. This can increase your chance of falling. Ask your doctor what other things that you can do to help prevent falls. This information is not intended to replace advice given to you by your health care provider. Make sure you discuss any questions you have with your health care provider. Document Released: 08/09/2009 Document Revised: 03/20/2016 Document Reviewed: 11/17/2014 Elsevier Interactive Patient Education  2017 [employer].

## 2021-10-02 NOTE — Progress Notes (Signed)
Subjective:   Kimberly Salazar is a 75 y.o. female who presents for Medicare Annual (Subsequent) preventive examination.  Virtual Visit via Telephone Note  I connected with  Kimberly Salazar on 10/02/21 at  2:40 PM EST by telephone and verified that I am speaking with the correct person using two identifiers.  Location: Patient: home Provider: [facility] Persons participating in the virtual visit: Kimberly Salazar   I discussed the limitations, risks, security and privacy concerns of performing an evaluation and management service by telephone and the availability of in person appointments. The patient expressed understanding and agreed to proceed.  Interactive audio and video telecommunications were attempted between this nurse and patient, however failed, due to patient having technical difficulties OR patient did not have access to video capability.  We continued and completed visit with audio only.  Some vital signs may be absent or patient reported.   Kimberly Marker, LPN   Review of Systems     Cardiac Risk Factors include: advanced age (>42men, >37 women);dyslipidemia;diabetes mellitus;hypertension     Objective:    There were no vitals filed for this visit. There is no height or weight on file to calculate BMI.  Advanced Directives 10/02/2021 10/01/2020 09/28/2019 03/23/2018 11/19/2015  Does Patient Have a Medical Advance Directive? No No No No No  Would patient like information on creating a medical advance directive? No - Patient declined No - Patient declined Yes (MAU/Ambulatory/Procedural Areas - Information given) No - Patient declined No - patient declined information    Current Medications (verified) Outpatient Encounter Medications as of 10/02/2021  Medication Sig   aspirin 81 MG chewable tablet Chew 1 tablet by mouth daily.   cholecalciferol (VITAMIN D3) 25 MCG (1000 UT) tablet Take 1,000 Units by mouth daily.   glimepiride (AMARYL) 2 MG tablet TAKE 1 TABLET BY  MOUTH TWICE DAILY WITH FOOD   glucose blood (ONE TOUCH ULTRA TEST) test strip 1 each by Other route 2 (two) times daily.   metFORMIN (GLUCOPHAGE-XR) 500 MG 24 hr tablet TAKE 2 TABLETS(1000 MG) BY MOUTH IN THE MORNING AND AT BEDTIME   metoprolol succinate (TOPROL-XL) 25 MG 24 hr tablet TAKE 1 TABLET BY MOUTH EVERY DAY   Multiple Vitamins-Minerals (MULTIVITAL) tablet Take 1 tablet by mouth daily.   olmesartan-hydrochlorothiazide (BENICAR HCT) 20-12.5 MG tablet TAKE 1 TABLET BY MOUTH DAILY   omega-3 acid ethyl esters (LOVAZA) 1 g capsule TAKE 4 CAPSULES BY MOUTH EVERY DAY   OZEMPIC, 0.25 OR 0.5 MG/DOSE, 2 MG/1.5ML SOPN INJECT 0.5 MG INTO THE SKIN ONCE A WEEK   simvastatin (ZOCOR) 80 MG tablet TAKE 1 TABLET(80 MG) BY MOUTH DAILY   No facility-administered encounter medications on file as of 10/02/2021.    Allergies (verified) Patient has no known allergies.   History: Past Medical History:  Diagnosis Date   Diabetes mellitus without complication ([place])    Hyperlipidemia    Hypertension    Past Surgical History:  Procedure Laterality Date   ABDOMINAL HYSTERECTOMY  1994   Uterine CA   APPENDECTOMY     CATARACT EXTRACTION Left 2011   LITHOTRIPSY  2005   PACEMAKER INSERTION  2010   Family History  Problem Relation Age of Onset   Diabetes Mother    Heart failure Father    Breast cancer Neg Hx    Social History   Socioeconomic History   Marital status: Widowed    Spouse name: Not on file   Number of children: 2   Years of education:  Not on file   Highest education level: Some college, no degree  Occupational History   Not on file  Tobacco Use   Smoking status: Never   Smokeless tobacco: Never  Vaping Use   Vaping Use: Never used  Substance and Sexual Activity   Alcohol use: No    Alcohol/week: 0.0 standard drinks   Drug use: No   Sexual activity: Not on file  Other Topics Concern   Not on file  Social History Narrative   Pt's daughter lives with her   Social  Determinants of Health   Financial Resource Strain: Low Risk    Difficulty of Paying Living Expenses: Not very hard  Food Insecurity: No Food Insecurity   Worried About Charity fundraiser in the Last Year: Never true   [place] in the Last Year: Never true  Transportation Needs: No Transportation Needs   Lack of Transportation (Medical): No   Lack of Transportation (Non-Medical): No  Physical Activity: Inactive   Days of Exercise per Week: 0 days   Minutes of Exercise per Session: 0 min  Stress: No Stress Concern Present   Feeling of Stress : Not at all  Social Connections: Socially Isolated   Frequency of Communication with Friends and Family: More than three times a week   Frequency of Social Gatherings with Friends and Family: Three times a week   Attends Religious Services: Never   Active Member of Clubs or Organizations: No   Attends Archivist Meetings: Never   Marital Status: Widowed    Tobacco Counseling Counseling given: Not Answered   Clinical Intake:  Pre-visit preparation completed: Yes  Pain : No/denies pain     Nutritional Risks: None Diabetes: Yes CBG done?: No Did pt. bring in CBG monitor from home?: No  How often do you need to have someone help you when you read instructions, pamphlets, or other written materials from your doctor or pharmacy?: 1 - Never  Nutrition Risk Assessment:  Has the patient had any N/V/D within the last 2 months?  No  Does the patient have any non-healing wounds?  No  Has the patient had any unintentional weight loss or weight gain?  No   Diabetes:  Is the patient diabetic?  Yes  If diabetic, was a CBG obtained today?  No  Did the patient bring in their glucometer from home?  No  How often do you monitor your CBG's? Occasionally per patient.   Financial Strains and Diabetes Management:  Are you having any financial strains with the device, your supplies or your medication? No .  Does the patient  want to be seen by Chronic Care Management for management of their diabetes?  No  Would the patient like to be referred to a Nutritionist or for Diabetic Management?  No   Diabetic Exams:  Diabetic Eye Exam: Completed 01/22/21 negative retinopathy.   Diabetic Foot Exam: Completed 05/06/21.  Interpreter Needed?: No  Information entered by :: Kimberly Marker LPN   Activities of Daily Living In your present state of health, do you have any difficulty performing the following activities: 10/02/2021 09/10/2021  Hearing? N N  Vision? N N  Difficulty concentrating or making decisions? N N  Walking or climbing stairs? N N  Dressing or bathing? N N  Doing errands, shopping? N N  Preparing Food and eating ? N -  Using the Toilet? N -  In the past six months, have you accidently leaked urine? N -  Do you have problems with loss of bowel control? N -  Managing your Medications? N -  Managing your Finances? N -  Housekeeping or managing your Housekeeping? N -  Some recent data might be hidden    Patient Care Team: Glean Hess, MD as PCP - General (Internal Medicine) Luana Shu, DO as Referring Physician (Cardiology) Edwyna Ready, MD as Referring Physician (Cardiology) Magda Kiel Erlene Senters, MD as Referring Physician (Dermatology) [facility])  Indicate any recent Medical Services you may have received from other than [facility] providers in the past year (date may be approximate).     Assessment:   This is a routine wellness examination for Jackson.  Hearing/Vision screen Hearing Screening - Comments:: Pt denies hearing difficulty Vision Screening - Comments:: Annual vision screen[facility] at [facility] in [place] and exercise activities discussed: Current Exercise Habits: The patient does not participate in regular exercise at present, Exercise limited by: None identified   Goals Addressed             This Visit's Progress    Increase  physical activity   Not on track    Recommend increasing physical activity to at least 3 days per week       Depression Screen PHQ 2/9 Scores 10/02/2021 09/10/2021 05/06/2021 12/31/2020 10/01/2020 09/13/2020 05/04/2020  PHQ - 2 Score 0 0 0 0 0 0 0  PHQ- 9 Score - 0 1 0 - 0 0    Fall Risk Fall Risk  10/02/2021 09/10/2021 05/06/2021 12/31/2020 10/01/2020  Falls in the past year? 0 0 0 0 0  Number falls in past yr: 0 0 - - 0  Injury with Fall? 0 0 - - 0  Risk for fall due to : No Fall Risks No Fall Risks - - No Fall Risks  Follow up Falls prevention discussed Falls evaluation completed Falls evaluation completed Falls evaluation completed Falls prevention discussed    FALL RISK PREVENTION PERTAINING TO THE HOME:  Any stairs in or around the home? Yes  If so, are there any without handrails? Yes - front steps outside Home free of loose throw rugs in walkways, pet beds, electrical cords, etc? Yes  Adequate lighting in your home to reduce risk of falls? Yes   ASSISTIVE DEVICES UTILIZED TO PREVENT FALLS:  Life alert? No  Use of a cane, walker or w/c? No  Grab bars in the bathroom? No  Shower chair or bench in shower? No  Elevated toilet seat or a handicapped toilet? No   TIMED UP AND GO:  Was the test performed? No . Telephonic visit  Cognitive Function: Normal cognitive status assessed by direct observation by this Nurse Health Advisor. No abnormalities found.        6CIT Screen 09/28/2019  What Year? 0 points  What month? 0 points  What time? 0 points  Count back from 20 0 points  Months in reverse 0 points  Repeat phrase 0 points  Total Score 0    Immunizations Immunization History  Administered Date(s) Administered   Fluad Quad(high Dose 65+) 09/08/2019, 09/13/2020, 09/10/2021   Influenza, High Dose Seasonal PF 09/30/2018   Influenza,inj,Quad PF,6+ Mos 11/19/2015, 07/15/2016   Influenza-Unspecified 06/27/2017   Moderna Sars-Covid-2 Vaccination 12/22/2019, 01/19/2020    Pneumococcal Conjugate-13 09/15/2014   Pneumococcal Polysaccharide-23 06/02/2011   Tdap 06/02/2011   Zoster, Live 10/27/2006    TDAP status: Due, Education has been provided regarding the importance of this vaccine. Advised  may receive this vaccine at local pharmacy or Health Dept. Aware to provide a copy of the vaccination record if obtained from local pharmacy or Health Dept. Verbalized acceptance and understanding.  Flu Vaccine status: Up to date  Pneumococcal vaccine status: Up to date  Covid-19 vaccine status: Completed vaccines  Qualifies for Shingles Vaccine? Yes   Zostavax completed Yes   Shingrix Completed?: No.    Education has been provided regarding the importance of this vaccine. Patient has been advised to call insurance company to determine out of pocket expense if they have not yet received this vaccine. Advised may also receive vaccine at local pharmacy or Health Dept. Verbalized acceptance and understanding.  Screening Tests Health Maintenance  Topic Date Due   Zoster Vaccines- Shingrix (1 of 2) Never done   COVID-19 Vaccine (3 - Moderna risk series) 02/16/2020   TETANUS/TDAP  06/01/2021   OPHTHALMOLOGY EXAM  01/22/2022   HEMOGLOBIN A1C  03/10/2022   FOOT EXAM  05/06/2022   MAMMOGRAM  05/15/2022   Pneumonia Vaccine 79+ Years old  Completed   INFLUENZA VACCINE  Completed   DEXA SCAN  Completed   Hepatitis C Screening  Completed   HPV VACCINES  Aged Out   COLONOSCOPY (Pts 45-6yrs Insurance coverage will need to be confirmed)  Discontinued    Health Maintenance  Health Maintenance Due  Topic Date Due   Zoster Vaccines- Shingrix (1 of 2) Never done   COVID-19 Vaccine (3 - Moderna risk series) 02/16/2020   TETANUS/TDAP  06/01/2021    Colorectal cancer screening: Type of screening: Colonoscopy. Completed 05/25/18. Repeat every 5 years  Mammogram status: Completed 05/15/21. Repeat every year  Bone Density status: Completed 05/15/21. Results reflect: Bone  density results: NORMAL. Repeat every 2 years.  Lung Cancer Screening: (Low Dose CT Chest recommended if Age 21-80 years, 30 pack-year currently smoking OR have quit w/in 15years.) does not qualify.   Additional Screening:  Hepatitis C Screening: does qualify; Completed 11/19/15  Vision Screening: Recommended annual ophthalmology exams for early detection of glaucoma and other disorders of the eye. Is the patient up to date with their annual eye exam?  Yes  Who is the provider or what is the name of the office in which the patient attends annual eye exams? [facility] in [place].   Dental Screening: Recommended annual dental exams for proper oral hygiene  Community Resource Referral / Chronic Care Management: CRR required this visit?  No   CCM required this visit?  No      Plan:     I have personally reviewed and noted the following in the patient's chart:   Medical and social history Use of alcohol, tobacco or illicit drugs  Current medications and supplements including opioid prescriptions.  Functional ability and status Nutritional status Physical activity Advanced directives List of other physicians Hospitalizations, surgeries, and ER visits in previous 12 months Vitals Screenings to include cognitive, depression, and falls Referrals and appointments  In addition, I have reviewed and discussed with patient certain preventive protocols, quality metrics, and best practice recommendations. A written personalized care plan for preventive services as well as general preventive health recommendations were provided to patient.     Kimberly Marker, LPN   [id number]   Nurse Notes: none

## 2021-11-28 ENCOUNTER — Other Ambulatory Visit: Payer: Self-pay | Admitting: Internal Medicine

## 2021-11-28 NOTE — Telephone Encounter (Signed)
Requested medication (s) are due for refill today:   Yes  Requested medication (s) are on the active medication list:   Yes  Future visit scheduled:   Yes   Last ordered: 07/08/2021 1.5 ml, 2 refills  Returned due to labs not meeting protocol criteria.      Requested Prescriptions  Pending Prescriptions Disp Refills   OZEMPIC, 0.25 OR 0.5 MG/DOSE, 2 MG/1.5ML SOPN [Pharmacy Med Name: OZEMPIC 0.25 OR 0.5MG /DOS 1X2MG  PEN] 1.5 mL 2    Sig: INJECT 0.5MG  SUBCUTANEOUS ONCE A WEEK     Endocrinology:  Diabetes - GLP-1 Receptor Agonists - semaglutide Failed - 11/28/2021  8:59 AM      Failed - HBA1C in normal range and within 180 days    Hemoglobin A1C  Date Value Ref Range Status  09/10/2021 6.3 (A) 4.0 - 5.6 % Final   Hgb A1c MFr Bld  Date Value Ref Range Status  05/06/2021 6.6 (H) 4.8 - 5.6 % Final    Comment:             Prediabetes: 5.7 - 6.4          Diabetes: >6.4          Glycemic control for adults with diabetes: <7.0           Failed - Cr in normal range and within 360 days    Creatinine, Ser  Date Value Ref Range Status  05/06/2021 1.19 (H) 0.57 - 1.00 mg/dL Final          Passed - Valid encounter within last 6 months    Recent Outpatient Visits           2 months ago Type II diabetes mellitus with complication [facility])   [facility] Glean Hess, MD   6 months ago Essential (primary) hypertension   [facility] Glean Hess, MD   11 months ago Type II diabetes mellitus with complication [facility])   [facility] Glean Hess, MD   1 year ago Type II diabetes mellitus with complication [facility])   [facility] Glean Hess, MD   1 year ago Essential (primary) hypertension   [facility] Glean Hess, MD       Future Appointments             In 1 month Army Melia Jesse Sans, MD [facility] [place]   In 5 months Army Melia, Jesse Sans, MD [facility], [facility]

## 2021-12-03 ENCOUNTER — Other Ambulatory Visit: Payer: Self-pay | Admitting: Internal Medicine

## 2021-12-04 ENCOUNTER — Telehealth: Payer: Self-pay | Admitting: Internal Medicine

## 2021-12-04 ENCOUNTER — Telehealth: Payer: Self-pay

## 2021-12-04 NOTE — Telephone Encounter (Signed)
Patient called Kimberly Salazar states insurance is asking for PA for OZEMPIC, 0.25 OR 0.5 MG/DOSE, 2 MG/1.5ML .

## 2021-12-04 NOTE — Telephone Encounter (Signed)
Prior auth approved effective 12/04/2021 - 12/04/2022 Kimberly Salazar from [place] contact: [phone]

## 2021-12-04 NOTE — Telephone Encounter (Signed)
Requested Prescriptions  Pending Prescriptions Disp Refills   metoprolol succinate (TOPROL-XL) 25 MG 24 hr tablet [Pharmacy Med Name: METOPROLOL ER SUCCINATE 25MG  TABS] 90 tablet 0    Sig: TAKE 1 TABLET BY MOUTH EVERY DAY     Cardiovascular:  Beta Blockers Passed - 12/03/2021  5:27 PM      Passed - Last BP in normal range    BP Readings from Last 1 Encounters:  09/10/21 128/74         Passed - Last Heart Rate in normal range    Pulse Readings from Last 1 Encounters:  09/10/21 74         Passed - Valid encounter within last 6 months    Recent Outpatient Visits          2 months ago Type II diabetes mellitus with complication [facility])   [facility] Glean [facility], [facility]   7 months ago Essential (primary) hypertension   [facility]an [facility], MD   11 months ago Type II diabetes mellitus with complication [facility])   [facility], [facility]   1 year ago Type II diabetes mellitus with complication [facility])   [facility]   1 year ago Essential (primary) hypertension   [facility]an [facility], [facility]      Future Appointments            In 1 month Army Melia Jesse Sans, MD [facility], [place]   In 5 months Army Melia, Jesse Sans, MD [facility], [facility]

## 2021-12-04 NOTE — Telephone Encounter (Signed)
Completed PA on [email or web address] for (Key: [id number]) Ozempic (0.25 or 0.5 MG/DOSE) 2MG /1.5ML pen-injectors.  Awaiting outcome.

## 2021-12-04 NOTE — Telephone Encounter (Signed)
Completed PA on [email or web address] for (Key: [id number]) Ozempic (0.25 or 0.5 MG/DOSE) 2MG /1.5ML pen-injectors.   Awaiting outcome.

## 2021-12-05 ENCOUNTER — Other Ambulatory Visit: Payer: Self-pay | Admitting: Internal Medicine

## 2021-12-05 DIAGNOSIS — E118 Type 2 diabetes mellitus with unspecified complications: Secondary | ICD-10-CM

## 2021-12-05 NOTE — Telephone Encounter (Signed)
Requested Prescriptions  Pending Prescriptions Disp Refills   metFORMIN (GLUCOPHAGE-XR) 500 MG 24 hr tablet [Pharmacy Med Name: METFORMIN ER 500MG 24HR TABS] 120 tablet 0    Sig: TAKE 2 TABLETS(1000 MG) BY MOUTH IN THE MORNING AND AT BEDTIME     Endocrinology:  Diabetes - Biguanides Failed - 12/05/2021 10:45 AM      Failed - Cr in normal range and within 360 days    Creatinine, Ser  Date Value Ref Range Status  05/06/2021 1.19 (H) 0.57 - 1.00 mg/dL Final         Failed - eGFR in normal range and within 360 days    GFR calc Af Amer  Date Value Ref Range Status  09/13/2020 66 >59 mL/min/1.73 Final    Comment:    **In accordance with recommendations from the NKF-ASN Task force,**   [facility] is in the process of updating its eGFR calculation to the   2021 CKD-EPI creatinine equation that estimates kidney function   without a race variable.    GFR calc non Af Amer  Date Value Ref Range Status  09/13/2020 57 (L) >59 mL/min/1.73 Final   eGFR  Date Value Ref Range Status  05/06/2021 48 (L) >59 mL/min/1.73 Final         Failed - B12 Level in normal range and within 720 days    No results found for: VITAMINB12       Passed - HBA1C is between 0 and 7.9 and within 180 days    Hemoglobin A1C  Date Value Ref Range Status  09/10/2021 6.3 (A) 4.0 - 5.6 % Final   Hgb A1c MFr Bld  Date Value Ref Range Status  05/06/2021 6.6 (H) 4.8 - 5.6 % Final    Comment:             Prediabetes: 5.7 - 6.4          Diabetes: >6.4          Glycemic control for adults with diabetes: <7.0          Passed - Valid encounter within last 6 months    Recent Outpatient Visits          2 months ago Type II diabetes mellitus with complication [facility])   [facility]   7 months ago Essential (primary) hypertension   [facility], [facility]   11 months ago Type II diabetes mellitus with complication [facility])   [facility]   1  year ago Type II diabetes mellitus with complication [facility])   [facility] Glean [facility], [facility]   1 year ago Essential (primary) hypertension   [facility] Glean [facility], MD      Future Appointments            In 1 month Army Melia Jesse Sans, MD [facility], [place]   In 5 months Glean Hess, MD [facility], [place] within normal limits and completed in the last 12 months    WBC  Date Value Ref Range Status  05/06/2021 6.0 3.4 - 10.8 x10E3/uL Final  05/04/2020 7.7 4.0 - 10.5 K/uL Final   RBC  Date Value Ref Range Status  05/06/2021 4.31 3.77 - 5.28 x10E6/uL Final  05/04/2020 4.56 3.87 - 5.11 MIL/uL Final   Hemoglobin  Date Value Ref Range Status  05/06/2021  13.5 11.1 - 15.9 g/dL Final   Hematocrit  Date Value Ref Range Status  05/06/2021 40.2 34.0 - 46.6 % Final   MCHC  Date Value Ref Range Status  05/06/2021 33.6 31.5 - 35.7 g/dL Final  05/04/2020 35.9 30.0 - 36.0 g/dL Final   [facility]  Date Value Ref Range Status  05/06/2021 31.3 26.6 - 33.0 pg Final  05/04/2020 30.9 26.0 - 34.0 pg Final   MCV  Date Value Ref Range Status  05/06/2021 93 79 - 97 fL Final   No results found for: PLTCOUNTKUC, LABPLAT, POCPLA RDW  Date Value Ref Range Status  05/06/2021 11.9 11.7 - 15.4 % Final

## 2021-12-05 NOTE — Telephone Encounter (Signed)
Called and left Pt a VM informing her.

## 2021-12-05 NOTE — Telephone Encounter (Signed)
Requested medication (s) are due for refill today - no  Requested medication (s) are on the active medication list -yes  Future visit scheduled -yes  Last refill: today  Notes to clinic: Request 90 day supply- patient has upcoming appointment- fails new B12 screening- sent to office for review   Requested Prescriptions  Pending Prescriptions Disp Refills   metFORMIN (GLUCOPHAGE-XR) 500 MG 24 hr tablet [Pharmacy Med Name: METFORMIN ER 500MG 24HR TABS] 360 tablet     Sig: TAKE 2 TABLETS(1000 MG) BY MOUTH IN THE MORNING AND AT BEDTIME     Endocrinology:  Diabetes - Biguanides Failed - 12/05/2021 11:58 AM      Failed - Cr in normal range and within 360 days    Creatinine, Ser  Date Value Ref Range Status  05/06/2021 1.19 (H) 0.57 - 1.00 mg/dL Final          Failed - eGFR in normal range and within 360 days    GFR calc Af Amer  Date Value Ref Range Status  09/13/2020 66 >59 mL/min/1.73 Final    Comment:    **In accordance with recommendations from the NKF-ASN Task force,**   [facility] is in the process of updating its eGFR calculation to the   2021 CKD-EPI creatinine equation that estimates kidney function   without a race variable.    GFR calc non Af Amer  Date Value Ref Range Status  09/13/2020 57 (L) >59 mL/min/1.73 Final   eGFR  Date Value Ref Range Status  05/06/2021 48 (L) >59 mL/min/1.73 Final          Failed - B12 Level in normal range and within 720 days    No results found for: VITAMINB12        Passed - HBA1C is between 0 and 7.9 and within 180 days    Hemoglobin A1C  Date Value Ref Range Status  09/10/2021 6.3 (A) 4.0 - 5.6 % Final   Hgb A1c MFr Bld  Date Value Ref Range Status  05/06/2021 6.6 (H) 4.8 - 5.6 % Final    Comment:             Prediabetes: 5.7 - 6.4          Diabetes: >6.4          Glycemic control for adults with diabetes: <7.0           Passed - Valid encounter within last 6 months    Recent Outpatient Visits           2 months ago  Type II diabetes mellitus with complication [facility])   [facility]   7 months ago Essential (primary) hypertension   [facility]   11 months ago Type II diabetes mellitus with complication [facility])   [facility], [facility]   1 year ago Type II diabetes mellitus with complication [facility])   [facility]   1 year ago Essential (primary) hypertension   [facility], [facility]       Future Appointments             In 1 month Army Melia Jesse Sans, MD [facility], [place]   In 5 months Glean Hess, MD [facility], [place] within normal limits and completed in the last 12 months  WBC  Date Value Ref Range Status  05/06/2021 6.0 3.4 - 10.8 x10E3/uL Final  05/04/2020 7.7 4.0 - 10.5 K/uL Final   RBC  Date Value Ref Range Status  05/06/2021 4.31 3.77 - 5.28 x10E6/uL Final  05/04/2020 4.56 3.87 - 5.11 MIL/uL Final   Hemoglobin  Date Value Ref Range Status  05/06/2021 13.5 11.1 - 15.9 g/dL Final   Hematocrit  Date Value Ref Range Status  05/06/2021 40.2 34.0 - 46.6 % Final   MCHC  Date Value Ref Range Status  05/06/2021 33.6 31.5 - 35.7 g/dL Final  05/04/2020 35.9 30.0 - 36.0 g/dL Final   [facility]  Date Value Ref Range Status  05/06/2021 31.3 26.6 - 33.0 pg Final  05/04/2020 30.9 26.0 - 34.0 pg Final   MCV  Date Value Ref Range Status  05/06/2021 93 79 - 97 fL Final   No results found for: PLTCOUNTKUC, LABPLAT, POCPLA RDW  Date Value Ref Range Status  05/06/2021 11.9 11.7 - 15.4 % Final            Requested Prescriptions  Pending Prescriptions Disp Refills   metFORMIN (GLUCOPHAGE-XR) 500 MG 24 hr tablet [Pharmacy Med Name: METFORMIN ER 500MG 24HR TABS] 360 tablet     Sig: TAKE 2 TABLETS(1000 MG) BY MOUTH IN THE MORNING AND AT BEDTIME     Endocrinology:  Diabetes - Biguanides Failed - 12/05/2021 11:58  AM      Failed - Cr in normal range and within 360 days    Creatinine, Ser  Date Value Ref Range Status  05/06/2021 1.19 (H) 0.57 - 1.00 mg/dL Final          Failed - eGFR in normal range and within 360 days    GFR calc Af Amer  Date Value Ref Range Status  09/13/2020 66 >59 mL/min/1.73 Final    Comment:    **In accordance with recommendations from the NKF-ASN Task force,**   [facility] is in the process of updating its eGFR calculation to the   2021 CKD-EPI creatinine equation that estimates kidney function   without a race variable.    GFR calc non Af Amer  Date Value Ref Range Status  09/13/2020 57 (L) >59 mL/min/1.73 Final   eGFR  Date Value Ref Range Status  05/06/2021 48 (L) >59 mL/min/1.73 Final          Failed - B12 Level in normal range and within 720 days    No results found for: VITAMINB12        Passed - HBA1C is between 0 and 7.9 and within 180 days    Hemoglobin A1C  Date Value Ref Range Status  09/10/2021 6.3 (A) 4.0 - 5.6 % Final   Hgb A1c MFr Bld  Date Value Ref Range Status  05/06/2021 6.6 (H) 4.8 - 5.6 % Final    Comment:             Prediabetes: 5.7 - 6.4          Diabetes: >6.4          Glycemic control for adults with diabetes: <7.0           Passed - Valid encounter within last 6 months    Recent Outpatient Visits           2 months ago Type II diabetes mellitus with complication [facility])   [facility]an [facility], [facility]   7 months ago Essential (primary) hypertension   [place],  Jesse Sans, MD   11 months ago Type II diabetes mellitus with complication [facility])   [facility] Glean Hess, MD   1 year ago Type II diabetes mellitus with complication [facility])   [facility] Glean [facility], [facility]   1 year ago Essential (primary) hypertension   [facility] Glean Hess, MD       Future Appointments             In 1 month Army Melia Jesse Sans, MD [facility]    In 5 months Glean Hess, MD [facility], [facility]            Passed - CBC within normal limits and completed in the last 12 months    WBC  Date Value Ref Range Status  05/06/2021 6.0 3.4 - 10.8 x10E3/uL Final  05/04/2020 7.7 4.0 - 10.5 K/uL Final   RBC  Date Value Ref Range Status  05/06/2021 4.31 3.77 - 5.28 x10E6/uL Final  05/04/2020 4.56 3.87 - 5.11 MIL/uL Final   Hemoglobin  Date Value Ref Range Status  05/06/2021 13.5 11.1 - 15.9 g/dL Final   Hematocrit  Date Value Ref Range Status  05/06/2021 40.2 34.0 - 46.6 % Final   MCHC  Date Value Ref Range Status  05/06/2021 33.6 31.5 - 35.7 g/dL Final  05/04/2020 35.9 30.0 - 36.0 g/dL Final   [facility]  Date Value Ref Range Status  05/06/2021 31.3 26.6 - 33.0 pg Final  05/04/2020 30.9 26.0 - 34.0 pg Final   MCV  Date Value Ref Range Status  05/06/2021 93 79 - 97 fL Final   No results found for: PLTCOUNTKUC, LABPLAT, POCPLA RDW  Date Value Ref Range Status  05/06/2021 11.9 11.7 - 15.4 % Final

## 2021-12-18 ENCOUNTER — Other Ambulatory Visit: Payer: Self-pay | Admitting: Internal Medicine

## 2021-12-18 NOTE — Telephone Encounter (Signed)
Requested Prescriptions  Pending Prescriptions Disp Refills   glimepiride (AMARYL) 2 MG tablet [Pharmacy Med Name: GLIMEPIRIDE 2MG  TABLETS] 180 tablet 0    Sig: TAKE 1 TABLET BY MOUTH TWICE DAILY WITH FOOD     Endocrinology:  Diabetes - Sulfonylureas Failed - 12/18/2021 11:18 AM      Failed - Cr in normal range and within 360 days    Creatinine, Ser  Date Value Ref Range Status  05/06/2021 1.19 (H) 0.57 - 1.00 mg/dL Final         Passed - HBA1C is between 0 and 7.9 and within 180 days    Hemoglobin A1C  Date Value Ref Range Status  09/10/2021 6.3 (A) 4.0 - 5.6 % Final   Hgb A1c MFr Bld  Date Value Ref Range Status  05/06/2021 6.6 (H) 4.8 - 5.6 % Final    Comment:             Prediabetes: 5.7 - 6.4          Diabetes: >6.4          Glycemic control for adults with diabetes: <7.0          Passed - Valid encounter within last 6 months    Recent Outpatient Visits          3 months ago Type II diabetes mellitus with complication [facility])   [facility], MD   7 months ago Essential (primary) hypertension   [facility] Glean Hess, MD   11 months ago Type II diabetes mellitus with complication [facility])   [facility] Glean Hess, MD   1 year ago Type II diabetes mellitus with complication [facility])   [facility] Glean [facility], MD   1 year ago Essential (primary) hypertension   [place], Laura H, MD      Future Appointments            In 3 weeks Army Melia Jesse Sans, MD [facility], [place]   In 5 months Army Melia, Jesse Sans, MD [facility], [facility]

## 2022-01-08 ENCOUNTER — Ambulatory Visit (INDEPENDENT_AMBULATORY_CARE_PROVIDER_SITE_OTHER): Payer: Medicare Other | Admitting: Internal Medicine

## 2022-01-08 ENCOUNTER — Encounter: Payer: Self-pay | Admitting: Internal Medicine

## 2022-01-08 ENCOUNTER — Other Ambulatory Visit: Payer: Self-pay

## 2022-01-08 VITALS — BP 124/70 | HR 65 | Ht 68.0 in | Wt 188.8 lb

## 2022-01-08 DIAGNOSIS — Z1231 Encounter for screening mammogram for malignant neoplasm of breast: Secondary | ICD-10-CM | POA: Diagnosis not present

## 2022-01-08 DIAGNOSIS — E118 Type 2 diabetes mellitus with unspecified complications: Secondary | ICD-10-CM

## 2022-01-08 DIAGNOSIS — I10 Essential (primary) hypertension: Secondary | ICD-10-CM | POA: Diagnosis not present

## 2022-01-08 NOTE — Progress Notes (Signed)
? ? ?Date:  01/08/2022  ? ?Name:  Kimberly Salazar   DOB:  1946/10/26   MRN:  [id number] ? ? ?Chief Complaint: Diabetes and Hypertension ? ?Diabetes ?She presents for her follow-up diabetic visit. She has type 2 diabetes mellitus. Her disease course has been stable. Pertinent negatives for hypoglycemia include no headaches or tremors. Pertinent negatives for diabetes include no chest pain, no fatigue, no polydipsia and no polyuria. There are no hypoglycemic complications. Symptoms are stable. There are no diabetic complications. Pertinent negatives for diabetic complications include no CVA. Current diabetic treatments: ozempic, metformin and glimepiride. She is compliant with treatment all of the time. She participates in exercise three times a week. There is no compliance with monitoring of blood glucose. An ACE inhibitor/angiotensin II receptor blocker is being taken. Eye exam is current.  ?Hypertension ?This is a chronic problem. The problem is controlled. Pertinent negatives include no chest pain, headaches, palpitations or shortness of breath. Past treatments include angiotensin blockers. The current treatment provides significant improvement. Hypertensive end-organ damage includes kidney disease. There is no history of CAD/MI or CVA.  ? ?Lab Results  ?Component Value Date  ? NA 139 05/06/2021  ? K 5.0 05/06/2021  ? CO2 21 05/06/2021  ? GLUCOSE 223 (H) 05/06/2021  ? BUN 28 (H) 05/06/2021  ? CREATININE 1.19 (H) 05/06/2021  ? CALCIUM 9.9 05/06/2021  ? EGFR 48 (L) 05/06/2021  ? GFRNONAA 57 (L) 09/13/2020  ? ?Lab Results  ?Component Value Date  ? CHOL 121 05/06/2021  ? HDL 47 05/06/2021  ? [place] 41 05/06/2021  ? TRIG 204 (H) 05/06/2021  ? CHOLHDL 2.6 05/06/2021  ? ?Lab Results  ?Component Value Date  ? TSH 2.720 05/06/2021  ? ?Lab Results  ?Component Value Date  ? HGBA1C 6.3 (A) 09/10/2021  ? ?Lab Results  ?Component Value Date  ? WBC 6.0 05/06/2021  ? HGB 13.5 05/06/2021  ? HCT 40.2 05/06/2021  ? MCV 93 05/06/2021   ? PLT 169 05/06/2021  ? ?Lab Results  ?Component Value Date  ? ALT 20 05/06/2021  ? AST 26 05/06/2021  ? ALKPHOS 53 05/06/2021  ? BILITOT 0.6 05/06/2021  ? ?No results found for: 25OHVITD2, [place], VD25OH  ? ?Review of Systems  ?Constitutional:  Negative for appetite change, fatigue, fever and unexpected weight change.  ?HENT:  Negative for tinnitus and trouble swallowing.   ?Eyes:  Negative for visual disturbance.  ?Respiratory:  Negative for cough, chest tightness and shortness of breath.   ?Cardiovascular:  Negative for chest pain, palpitations and leg swelling.  ?Gastrointestinal:  Negative for abdominal pain.  ?Endocrine: Negative for polydipsia and polyuria.  ?Genitourinary:  Negative for dysuria and hematuria.  ?Musculoskeletal:  Negative for arthralgias.  ?Neurological:  Negative for tremors, numbness and headaches.  ?Psychiatric/Behavioral:  Negative for dysphoric mood.   ? ?Patient Active Problem List  ? Diagnosis Date Noted  ? Ovarian failure 04/09/2017  ? Basal cell carcinoma, leg 07/15/2016  ? Abnormal finding on thallium stress test 05/17/2015  ? Hyperlipidemia associated with type 2 diabetes mellitus ([place]) 04/12/2015  ? Essential (primary) hypertension 04/12/2015  ? History of uterine cancer 04/12/2015  ? Type II diabetes mellitus with complication ([place]) 43/15/4008  ? Cardiac pacemaker in situ 10/11/2009  ? ? ?No Known Allergies ? ?Past Surgical History:  ?Procedure Laterality Date  ? ABDOMINAL HYSTERECTOMY  1994  ? Uterine CA  ? APPENDECTOMY    ? CATARACT EXTRACTION Left 2011  ? LITHOTRIPSY  2005  ? PACEMAKER INSERTION  2010  ? ? ?Social History  ? ?Tobacco Use  ? Smoking status: Never  ? Smokeless tobacco: Never  ?Vaping Use  ? Vaping Use: Never used  ?Substance Use Topics  ? Alcohol use: No  ?  Alcohol/week: 0.0 standard drinks  ? Drug use: No  ? ? ? ?Medication list has been reviewed and updated. ? ?Current Meds  ?Medication Sig  ? aspirin 81 MG chewable tablet Chew 1 tablet by mouth daily.  ?  cholecalciferol (VITAMIN D3) 25 MCG (1000 UT) tablet Take 1,000 Units by mouth daily.  ? glimepiride (AMARYL) 2 MG tablet TAKE 1 TABLET BY MOUTH TWICE DAILY WITH FOOD  ? glucose blood (ONE TOUCH ULTRA TEST) test strip 1 each by Other route 2 (two) times daily.  ? metFORMIN (GLUCOPHAGE-XR) 500 MG 24 hr tablet Take 2 tablets (1,000 mg total) by mouth in the morning and at bedtime.  ? metoprolol succinate (TOPROL-XL) 25 MG 24 hr tablet TAKE 1 TABLET BY MOUTH EVERY DAY  ? Multiple Vitamins-Minerals (MULTIVITAL) tablet Take 1 tablet by mouth daily.  ? olmesartan-hydrochlorothiazide (BENICAR HCT) 20-12.5 MG tablet TAKE 1 TABLET BY MOUTH DAILY  ? omega-3 acid ethyl esters (LOVAZA) 1 g capsule TAKE 4 CAPSULES BY MOUTH EVERY DAY  ? OZEMPIC, 0.25 OR 0.5 MG/DOSE, 2 MG/1.5ML SOPN INJECT 0.5MG SUBCUTANEOUS ONCE A WEEK  ? simvastatin (ZOCOR) 80 MG tablet TAKE 1 TABLET(80 MG) BY MOUTH DAILY  ? ? ?PHQ 2/9 Scores 01/08/2022 10/02/2021 09/10/2021 05/06/2021  ?PHQ - 2 Score 0 0 0 0  ?PHQ- 9 Score 0 - 0 1  ? ? ?GAD 7 : Generalized Anxiety Score 01/08/2022 09/10/2021 05/06/2021 12/31/2020  ?Nervous, Anxious, on Edge 0 0 0 0  ?Control/stop worrying 0 0 0 0  ?Worry too much - different things 0 0 0 0  ?Trouble relaxing 0 0 0 0  ?Restless 0 0 0 0  ?Easily annoyed or irritable 0 0 0 0  ?Afraid - awful might happen 0 0 0 0  ?Total GAD 7 Score 0 0 0 0  ?Anxiety Difficulty Not difficult at all Not difficult at all Not difficult at all -  ? ? ?BP Readings from Last 3 Encounters:  ?01/08/22 124/70  ?09/10/21 128/74  ?05/06/21 122/64  ? ? ?Physical Exam ?Vitals and nursing note reviewed.  ?Constitutional:   ?   General: She is not in acute distress. ?   Appearance: She is well-developed.  ?HENT:  ?   Head: Normocephalic and atraumatic.  ?Neck:  ?   Vascular: No carotid bruit.  ?Cardiovascular:  ?   Rate and Rhythm: Normal rate and regular rhythm.  ?   Heart sounds: No murmur heard. ?Pulmonary:  ?   Effort: Pulmonary effort is normal. No respiratory  distress.  ?   Breath sounds: No wheezing or rhonchi.  ?Musculoskeletal:  ?   Cervical back: Normal range of motion.  ?   Right lower leg: No edema.  ?   Left lower leg: No edema.  ?Lymphadenopathy:  ?   Cervical: No cervical adenopathy.  ?Skin: ?   General: Skin is warm and dry.  ?   Capillary Refill: Capillary refill takes less than 2 seconds.  ?   Findings: No rash.  ?Neurological:  ?   General: No focal deficit present.  ?   Mental Status: She is alert and oriented to person, place, and time.  ?Psychiatric:     ?   Mood and Affect: Mood normal.     ?  Behavior: Behavior normal.  ? ? ?Wt Readings from Last 3 Encounters:  ?01/08/22 188 lb 12.8 oz (85.6 kg)  ?09/10/21 182 lb 6.4 oz (82.7 kg)  ?05/06/21 179 lb (81.2 kg)  ? ? ?BP 124/70   Pulse 65   Ht 5' 8"  (1.727 m)   Wt 188 lb 12.8 oz (85.6 kg)   SpO2 95%   BMI 28.71 kg/m?  ? ?Assessment and Plan: ?1. Type II diabetes mellitus with complication ([place]) ?Clinically stable by exam and report without s/s of hypoglycemia. ?DM complicated by hypertension and dyslipidemia. ?Tolerating medications well without side effects or other concerns. ?If A1C is much improved, consider reducing Ozempic to 0.25 mg ?- Basic metabolic panel ?- Hemoglobin A1c ? ?2. Essential (primary) hypertension ?Clinically stable exam with well controlled BP. ?Tolerating medications without side effects at this time. ?Pt to continue current regimen and low sodium diet; benefits of regular exercise as able discussed. ? ?3. Encounter for screening mammogram for breast cancer ?Schedule in July prior to CPX ?- MM 3D SCREEN BREAST BILATERAL ? ? ?Partially dictated using Editor, commissioning. Any errors are unintentional. ? ?Halina Maidens, MD ?[facility] ?[facility] ? ?01/08/2022 ? ? ? ? ? ?

## 2022-01-09 LAB — BASIC METABOLIC PANEL
BUN/Creatinine Ratio: 17 (ref 12–28)
BUN: 18 mg/dL (ref 8–27)
CO2: 22 mmol/L (ref 20–29)
Calcium: 10.3 mg/dL (ref 8.7–10.3)
Chloride: 101 mmol/L (ref 96–106)
Creatinine, Ser: 1.07 mg/dL — ABNORMAL HIGH (ref 0.57–1.00)
Glucose: 236 mg/dL — ABNORMAL HIGH (ref 70–99)
Potassium: 4.7 mmol/L (ref 3.5–5.2)
Sodium: 140 mmol/L (ref 134–144)
eGFR: 54 mL/min/{1.73_m2} — ABNORMAL LOW (ref >59)

## 2022-01-09 LAB — HEMOGLOBIN A1C
Est. average glucose Bld gHb Est-mCnc: 154 mg/dL
Hgb A1c MFr Bld: 7 % — ABNORMAL HIGH (ref 4.8–5.6)

## 2022-01-13 ENCOUNTER — Other Ambulatory Visit: Payer: Self-pay | Admitting: Internal Medicine

## 2022-01-15 NOTE — Telephone Encounter (Signed)
Requested Prescriptions  ?Pending Prescriptions Disp Refills  ?? simvastatin (ZOCOR) 80 MG tablet [Pharmacy Med Name: SIMVASTATIN '80MG'$  TABLETS] 90 tablet 1  ?  Sig: TAKE 1 TABLET(80 MG) BY MOUTH DAILY  ?  ? Cardiovascular:  Antilipid - Statins Failed - 01/13/2022 12:17 PM  ?  ?  Failed - Lipid Panel in normal range within the last 12 months  ?  Cholesterol, Total  ?Date Value Ref Range Status  ?05/06/2021 121 100 - 199 mg/dL Final  ? ?LDL Chol Calc (NIH)  ?Date Value Ref Range Status  ?05/06/2021 41 0 - 99 mg/dL Final  ? ?HDL  ?Date Value Ref Range Status  ?05/06/2021 47 >39 mg/dL Final  ? ?Triglycerides  ?Date Value Ref Range Status  ?05/06/2021 204 (H) 0 - 149 mg/dL Final  ? ?  ?  ?  Passed - Patient is not pregnant  ?  ?  Passed - Valid encounter within last 12 months  ?  Recent Outpatient Visits   ?      ? 1 week ago Type II diabetes mellitus with complication ([place])  ? [facility] Gle[facility], [facility]  ? 4 months ago Type II diabetes mellitus with complication [facility])  ? [facility], [facility]  ? 8 months ago Essential (primary) hypertension  ? [facility], [facility]  ? 1 year ago Type II diabetes mellitus with complication [facility])  ? [facility] Glean Hess, MD  ? 1 year ago Type II diabetes mellitus with complication [facility])  ? [facility] Glean Hess, MD  ?  ?  ?Future Appointments   ?        ? In 4 months Army Melia Jesse Sans, MD [facility] [place]  ?  ? ?  ?  ?  ? ?

## 2022-02-28 ENCOUNTER — Other Ambulatory Visit: Payer: Self-pay | Admitting: Internal Medicine

## 2022-02-28 DIAGNOSIS — I10 Essential (primary) hypertension: Secondary | ICD-10-CM

## 2022-02-28 NOTE — Telephone Encounter (Signed)
Requested Prescriptions  ?Pending Prescriptions Disp Refills  ?? olmesartan-hydrochlorothiazide (BENICAR HCT) 20-12.5 MG tablet [Pharmacy Med Name: OLMESARTAN MEDOX/HCTZ 20-12.5MG TAB] 90 tablet 1  ?  Sig: TAKE 1 TABLET BY MOUTH DAILY  ?  ? Cardiovascular: ARB + Diuretic Combos Failed - 02/28/2022  6:15 AM  ?  ?  Failed - Cr in normal range and within 180 days  ?  Creatinine, Ser  ?Date Value Ref Range Status  ?01/08/2022 1.07 (H) 0.57 - 1.00 mg/dL Final  ?   ?  ?  Passed - K in normal range and within 180 days  ?  Potassium  ?Date Value Ref Range Status  ?01/08/2022 4.7 3.5 - 5.2 mmol/L Final  ?   ?  ?  Passed - Na in normal range and within 180 days  ?  Sodium  ?Date Value Ref Range Status  ?01/08/2022 140 134 - 144 mmol/L Final  ?   ?  ?  Passed - eGFR is 10 or above and within 180 days  ?  GFR calc Af Amer  ?Date Value Ref Range Status  ?09/13/2020 66 >59 mL/min/1.73 Final  ?  Comment:  ?  **In accordance with recommendations from the NKF-ASN Task force,** ?  [facility] is in the process of updating its eGFR calculation to the ?  2021 CKD-EPI creatinine equation that estimates kidney function ?  without a race variable. ?  ? ?GFR calc non Af Amer  ?Date Value Ref Range Status  ?09/13/2020 57 (L) >59 mL/min/1.73 Final  ? ?eGFR  ?Date Value Ref Range Status  ?01/08/2022 54 (L) >59 mL/min/1.73 Final  ?   ?  ?  Passed - Patient is not pregnant  ?  ?  Passed - Last BP in normal range  ?  BP Readings from Last 1 Encounters:  ?01/08/22 124/70  ?   ?  ?  Passed - Valid encounter within last 6 months  ?  Recent Outpatient Visits   ?      ? 1 month ago Type II diabetes mellitus with complication ([facility])  ? [facility] Glean [facility], MD  ? 5 months ago Type II diabetes mellitus with complication [facility])  ? [facility], [facility]  ? 9 months ago Essential (primary) hypertension  ? [facility] Glean [facility], [facility]  ? 1 year ago Type II diabetes mellitus with complication [facility])  ? [facility]an [facility], MD  ? 1 year ago Type II diabetes mellitus with complication [facility])  ? [facility], [facility]  ?  ?  ?Future Appointments   ?        ? In 3 months Glean Hess, MD [facility], [facility]  ?  ? ?  ?  ?  ?? metoprolol succinate (TOPROL-XL) 25 MG 24 hr tablet [Pharmacy Med Name: METOPROLOL ER SUCCINATE 25MG TABS] 90 tablet 0  ?  Sig: TAKE 1 TABLET BY MOUTH EVERY DAY  ?  ? Cardiovascular:  Beta Blockers Passed - 02/28/2022  6:15 AM  ?  ?  Passed - Last BP in normal range  ?  BP Readings from Last 1 Encounters:  ?01/08/22 124/70  ?   ?  ?  Passed - Last Heart Rate in normal range  ?  Pulse Readings from Last 1 Encounters:  ?01/08/22 65  ?   ?  ?  Passed - Valid encounter within last 6 months  ?  Recent Outpatient Visits   ?      ? 1 month ago Type II diabetes mellitus with complication ([place])  ? [facility] Glean Hess, MD  ? 5 months ago Type II diabetes mellitus with complication [place])  ? [facility] Gle[facility], MD  ? 9 months ago Essential (primary) hypertension  ? [facility] Glean Hess, MD  ? 1 year ago Type II diabetes mellitus with complication [facility])  ? [facility] Glean [facility], [facility]  ? 1 year ago Type II diabetes mellitus with complication [facility])  ? [facility] Gle[facility], MD  ?  ?  ?Future Appointments   ?        ? In 3 months Army Melia Jesse Sans, MD [facility], [facility]  ?  ? ?  ?  ?  ?? glimepiride (AMARYL) 2 MG tablet [Pharmacy Med Name: GLIMEPIRIDE 2MG TABLETS] 180 tablet 0  ?  Sig: TAKE 1 TABLET BY MOUTH TWICE DAILY WITH FOOD  ?  ? Endocrinology:  Diabetes - Sulfonylureas Failed - 02/28/2022  6:15 AM  ?  ?  Failed - Cr in normal range and within 360 days  ?  Creatinine, Ser  ?Date Value Ref Range Status  ?01/08/2022 1.07 (H) 0.57 - 1.00 mg/dL Final  ?   ?  ?  Passed - HBA1C is between 0 and 7.9 and within 180 days  ?  Hgb A1c MFr Bld  ?Date Value Ref Range Status   ?01/08/2022 7.0 (H) 4.8 - 5.6 % Final  ?  Comment:  ?           Prediabetes: 5.7 - 6.4 ?         Diabetes: >6.4 ?         Glycemic control for adults with diabetes: <7.0 ?  ?   ?  ?  Passed - Valid encounter within last 6 months  ?  Recent Outpatient Visits   ?      ? 1 month ago Type II diabetes mellitus with complication ([place])  ? [facility] Glean Hess, MD  ? 5 months ago Type II diabetes mellitus with complication [facility])  ? [facility] Glean Hess, MD  ? 9 months ago Essential (primary) hypertension  ? [facility] Glean Hess, MD  ? 1 year ago Type II diabetes mellitus with complication [facility])  ? [facility], MD  ? 1 year ago Type II diabetes mellitus with complication [facility])  ? [facility] Gle[facility], MD  ?  ?  ?Future Appointments   ?        ? In 3 months Army Melia Jesse Sans, MD [facility]  ?  ? ?  ?  ?  ? ? ?

## 2022-03-04 ENCOUNTER — Other Ambulatory Visit: Payer: Self-pay | Admitting: Internal Medicine

## 2022-03-05 NOTE — Telephone Encounter (Signed)
Requested medication (s) are due for refill today:   Yes ? ?Requested medication (s) are on the active medication list:   Yes ? ?Future visit scheduled:   Yes 06/10/2022 ? ? ?Last ordered: 11/28/2021 1.5 ml, 2 refills ? ?Returned because no protocol assigned to this medication  ? ?Requested Prescriptions  ?Pending Prescriptions Disp Refills  ? OZEMPIC, 0.25 OR 0.5 MG/DOSE, 2 MG/3ML SOPN [Pharmacy Med Name: OZEMPIC 0.25 OR 0.'5MG'$ /DOS1X'2MG'$  3ML] 3 mL   ?  Sig: INJECT 0.'5MG'$  UNDER THE SKIN ONCE A WEEK  ?  ? There is no refill protocol information for this order  ?  ? ?

## 2022-04-04 ENCOUNTER — Other Ambulatory Visit: Payer: Self-pay | Admitting: Internal Medicine

## 2022-04-07 NOTE — Telephone Encounter (Signed)
Requested medication (s) are due for refill today:   Provider to review  Requested medication (s) are on the active medication list:   Yes  Future visit scheduled:   Yes 06/10/2022   Last ordered: 03/05/2022 3 ml, 0 refills  No protocol for this medication attached to this request   Requested Prescriptions  Pending Prescriptions Disp Refills   OZEMPIC, 0.25 OR 0.5 MG/DOSE, 2 MG/3ML SOPN [Pharmacy Med Name: OZEMPIC 0.25 OR 0.'5MG'$ /DOS1X'2MG'$  3ML] 3 mL 0    Sig: INJECT 0.'5MG'$  SUBCUTANEOUS ONCE A WEEK     There is no refill protocol information for this order

## 2022-05-02 ENCOUNTER — Other Ambulatory Visit: Payer: Self-pay | Admitting: Internal Medicine

## 2022-05-02 NOTE — Telephone Encounter (Signed)
Requested medication (s) are due for refill today:   Yes  Requested medication (s) are on the active medication list:   Yes  Future visit scheduled:   06/10/2022   Last ordered: 04/07/2022 3 ml, 0 refills  Returned because no protocol assigned to this medication   Requested Prescriptions  Pending Prescriptions Disp Refills   OZEMPIC, 0.25 OR 0.5 MG/DOSE, 2 MG/3ML SOPN [Pharmacy Med Name: OZEMPIC 0.25 OR 0.'5MG'$ /DOS1X'2MG'$  3ML] 3 mL 0    Sig: INJECT 0.'5MG'$  UNDER THE SKIN ONCE A WEEK     There is no refill protocol information for this order

## 2022-05-08 ENCOUNTER — Encounter: Payer: Medicare Other | Admitting: Internal Medicine

## 2022-05-12 ENCOUNTER — Other Ambulatory Visit: Payer: Self-pay | Admitting: Internal Medicine

## 2022-05-13 NOTE — Telephone Encounter (Signed)
Duplicate request. Requested Prescriptions  Pending Prescriptions Disp Refills  . OZEMPIC, 0.25 OR 0.5 MG/DOSE, 2 MG/3ML SOPN [Pharmacy Med Name: OZEMPIC 0.25 OR 0.'5MG'$ /DOS1X'2MG'$  3ML] 3 mL 0    Sig: INJECT 0.'5MG'$  UNDER THE SKIN ONE DAY A WEEK     There is no refill protocol information for this order

## 2022-05-29 ENCOUNTER — Other Ambulatory Visit: Payer: Self-pay | Admitting: Internal Medicine

## 2022-05-29 NOTE — Telephone Encounter (Signed)
Requested medication (s) are due for refill today: yes  Requested medication (s) are on the active medication list: yes  Last refill:  06/04/21  Future visit scheduled: in 1 week  Notes to clinic:  overdue lab work >1 year   Requested Prescriptions  Pending Prescriptions Disp Refills   omega-3 acid ethyl esters (LOVAZA) 1 g capsule [Pharmacy Med Name: OMEGA-3-ACID 1GM CAPSULES (RX)] 120 capsule 11    Sig: TAKE [place] DAY     Endocrinology:  Nutritional Agents - omega-3 acid ethyl esters Failed - 05/29/2022  8:58 AM      Failed - Lipid Panel in normal range within the last 12 months    Cholesterol, Total  Date Value Ref Range Status  05/06/2021 121 100 - 199 mg/dL Final   LDL Chol Calc (NIH)  Date Value Ref Range Status  05/06/2021 41 0 - 99 mg/dL Final   HDL  Date Value Ref Range Status  05/06/2021 47 >39 mg/dL Final   Triglycerides  Date Value Ref Range Status  05/06/2021 204 (H) 0 - 149 mg/dL Final         Passed - Valid encounter within last 12 months    Recent Outpatient Visits           4 months ago Type II diabetes mellitus with complication [facility])   [facility], [facility]   8 months ago Type II diabetes mellitus with complication [facility])   [facility]   1 year ago Essential (primary) hypertension   [facility], [facility]   1 year ago Type II diabetes mellitus with complication [facility])   [facility], [facility]   1 year ago Type II diabetes mellitus with complication [facility])   [facility] Glean [facility], [facility]       Future Appointments             In 1 week Army Melia Jesse Sans, MD [facility], [facility]

## 2022-05-29 NOTE — Telephone Encounter (Signed)
Requested medication (s) are due for refill today: yes  Requested medication (s) are on the active medication list: yes  Last refill:  Omega 06/04/21 #120/11, Zocor 01/15/22 #90/1  Future visit scheduled: yes  Notes to clinic:  Unable to refill per protocol due to failed labs, no updated results.      Requested Prescriptions  Pending Prescriptions Disp Refills   omega-3 acid ethyl esters (LOVAZA) 1 g capsule [Pharmacy Med Name: OMEGA-3-ACID 1GM CAPSULES (RX)] 120 capsule 11    Sig: TAKE [place] DAY     Endocrinology:  Nutritional Agents - omega-3 acid ethyl esters Failed - 05/29/2022  6:15 AM      Failed - Lipid Panel in normal range within the last 12 months    Cholesterol, Total  Date Value Ref Range Status  05/06/2021 121 100 - 199 mg/dL Final   LDL Chol Calc (NIH)  Date Value Ref Range Status  05/06/2021 41 0 - 99 mg/dL Final   HDL  Date Value Ref Range Status  05/06/2021 47 >39 mg/dL Final   Triglycerides  Date Value Ref Range Status  05/06/2021 204 (H) 0 - 149 mg/dL Final         Passed - Valid encounter within last 12 months    Recent Outpatient Visits           4 months ago Type II diabetes mellitus with complication [facility])   [facility], [facility]   8 months ago Type II diabetes mellitus with complication [facility])   [facility], [facility]   1 year ago Essential (primary) hypertension   [facility]an [facility], [facility]   1 year ago Type II diabetes mellitus with complication [facility])   [facility] Glean [facility], MD   1 year ago Type II diabetes mellitus with complication [facility]   [place], Laura H, MD       Future Appointments             In 1 week Glean Hess, MD [facility], [facility]             simvastatin (ZOCOR) 80 MG tablet [Pharmacy Med Name: SIMVASTATIN '80MG'$  TABLETS] 90 tablet 1    Sig: TAKE 1 TABLET(80 MG) BY MOUTH DAILY      Cardiovascular:  Antilipid - Statins Failed - 05/29/2022  6:15 AM      Failed - Lipid Panel in normal range within the last 12 months    Cholesterol, Total  Date Value Ref Range Status  05/06/2021 121 100 - 199 mg/dL Final   LDL Chol Calc (NIH)  Date Value Ref Range Status  05/06/2021 41 0 - 99 mg/dL Final   HDL  Date Value Ref Range Status  05/06/2021 47 >39 mg/dL Final   Triglycerides  Date Value Ref Range Status  05/06/2021 204 (H) 0 - 149 mg/dL Final         Passed - Patient is not pregnant      Passed - Valid encounter within last 12 months    Recent Outpatient Visits           4 months ago Type II diabetes mellitus with complication [facility])   [facility] Glean [facility], [facility]   8 months ago Type II diabetes mellitus with complication [facility])   [facility] Gle[facility], MD   1 year ago Essential (primary) hypertension  [facility] Glean Hess, MD   1 year ago Type II diabetes mellitus with complication [facility])   [facility] Glean Hess, MD   1 year ago Type II diabetes mellitus with complication [facility])   [facility] Glean Hess, MD       Future Appointments             In 1 week Army Melia Jesse Sans, MD [facility], [facility]

## 2022-06-03 ENCOUNTER — Other Ambulatory Visit: Payer: Self-pay | Admitting: Internal Medicine

## 2022-06-03 DIAGNOSIS — E118 Type 2 diabetes mellitus with unspecified complications: Secondary | ICD-10-CM

## 2022-06-03 NOTE — Telephone Encounter (Signed)
Requested medication (s) are due for refill today: Simvastatin a little early, Metformin on time  Requested medication (s) are on the active medication list: yes  Last refill:  Simvastatin 01/15/22 #90 with 1 RF, Metformin 12/05/21 #360 with 1 RF  Future visit scheduled: yes 06/10/22, seen 01/08/22  Notes to clinic:  Failed protocol of labs within 12 months, lipids and cbc from 05/06/2021, has upcoming appt, please assess.      Requested Prescriptions  Pending Prescriptions Disp Refills   simvastatin (ZOCOR) 80 MG tablet [Pharmacy Med Name: SIMVASTATIN 80MG TABLETS] 90 tablet 1    Sig: TAKE 1 TABLET(80 MG) BY MOUTH DAILY     Cardiovascular:  Antilipid - Statins Failed - 06/03/2022 10:45 AM      Failed - Lipid Panel in normal range within the last 12 months    Cholesterol, Total  Date Value Ref Range Status  05/06/2021 121 100 - 199 mg/dL Final   LDL Chol Calc (NIH)  Date Value Ref Range Status  05/06/2021 41 0 - 99 mg/dL Final   HDL  Date Value Ref Range Status  05/06/2021 47 >39 mg/dL Final   Triglycerides  Date Value Ref Range Status  05/06/2021 204 (H) 0 - 149 mg/dL Final         Passed - Patient is not pregnant      Passed - Valid encounter within last 12 months    Recent Outpatient Visits           4 months ago Type II diabetes mellitus with complication [facility])   [facility], [facility]   8 months ago Type II diabetes mellitus with complication [facility])   [facility]an [facility], [facility]   1 year ago Essential (primary) hypertension   [facility] Gle[facility], MD   1 year ago Type II diabetes mellitus with complication [facility])   [facility] Glean Hess, MD   1 year ago Type II diabetes mellitus with complication [facility])   [facility] Glean [facility], MD       Future Appointments             In 1 week Glean Hess, MD [facility], [facility]             metFORMIN (GLUCOPHAGE-XR)  500 MG 24 hr tablet [Pharmacy Med Name: METFORMIN ER 500MG 24HR TABS] 360 tablet 1    Sig: TAKE 2 TABLETS(1000 MG) BY MOUTH IN THE MORNING AND AT BEDTIME     Endocrinology:  Diabetes - Biguanides Failed - 06/03/2022 10:45 AM      Failed - Cr in normal range and within 360 days    Creatinine, Ser  Date Value Ref Range Status  01/08/2022 1.07 (H) 0.57 - 1.00 mg/dL Final         Failed - eGFR in normal range and within 360 days    GFR calc Af Amer  Date Value Ref Range Status  09/13/2020 66 >59 mL/min/1.73 Final    Comment:    **In accordance with recommendations from the NKF-ASN Task force,**   [facility] is in the process of updating its eGFR calculation to the   2021 CKD-EPI creatinine equation that estimates kidney function   without a race variable.    GFR calc non Af Amer  Date Value Ref Range Status  09/13/2020 57 (L) >59 mL/min/1.73 Final   eGFR  Date Value Ref Range Status  01/08/2022 54 (L) >  59 mL/min/1.73 Final         Failed - B12 Level in normal range and within 720 days    No results found for: "VITAMINB12"       Failed - CBC within normal limits and completed in the last 12 months    WBC  Date Value Ref Range Status  05/06/2021 6.0 3.4 - 10.8 x10E3/uL Final  05/04/2020 7.7 4.0 - 10.5 K/uL Final   RBC  Date Value Ref Range Status  05/06/2021 4.31 3.77 - 5.28 x10E6/uL Final  05/04/2020 4.56 3.87 - 5.11 MIL/uL Final   Hemoglobin  Date Value Ref Range Status  05/06/2021 13.5 11.1 - 15.9 g/dL Final   Hematocrit  Date Value Ref Range Status  05/06/2021 40.2 34.0 - 46.6 % Final   MCHC  Date Value Ref Range Status  05/06/2021 33.6 31.5 - 35.7 g/dL Final  05/04/2020 35.9 30.0 - 36.0 g/dL Final   [facility]  Date Value Ref Range Status  05/06/2021 31.3 26.6 - 33.0 pg Final  05/04/2020 30.9 26.0 - 34.0 pg Final   MCV  Date Value Ref Range Status  05/06/2021 93 79 - 97 fL Final   No results found for: "PLTCOUNTKUC", "LABPLAT", "POCPLA" RDW  Date Value Ref  Range Status  05/06/2021 11.9 11.7 - 15.4 % Final         Passed - HBA1C is between 0 and 7.9 and within 180 days    Hgb A1c MFr Bld  Date Value Ref Range Status  01/08/2022 7.0 (H) 4.8 - 5.6 % Final    Comment:             Prediabetes: 5.7 - 6.4          Diabetes: >6.4          Glycemic control for adults with diabetes: <7.0          Passed - Valid encounter within last 6 months    Recent Outpatient Visits           4 months ago Type II diabetes mellitus with complication [facility])   [facility] Glean [facility], [facility]   8 months ago Type II diabetes mellitus with complication [facility])   [facility] Glean Hess, MD   1 year ago Essential (primary) hypertension   [facility] Glean [facility], MD   1 year ago Type II diabetes mellitus with complication [facility])   [facility] Glean Hess, MD   1 year ago Type II diabetes mellitus with complication [facility])   [facility] Glean Hess, MD       Future Appointments             In 1 week Glean Hess, MD [facility], [facility]

## 2022-06-05 ENCOUNTER — Other Ambulatory Visit: Payer: Self-pay | Admitting: Internal Medicine

## 2022-06-06 NOTE — Telephone Encounter (Signed)
Refilled 01/15/2022 #90 1 refill - confirmed by same pharmacy. Requested Prescriptions  Pending Prescriptions Disp Refills  . simvastatin (ZOCOR) 80 MG tablet [Pharmacy Med Name: SIMVASTATIN '80MG'$  TABLETS] 90 tablet 1    Sig: TAKE 1 TABLET(80 MG) BY MOUTH DAILY     Cardiovascular:  Antilipid - Statins Failed - 06/05/2022  7:34 PM      Failed - Lipid Panel in normal range within the last 12 months    Cholesterol, Total  Date Value Ref Range Status  05/06/2021 121 100 - 199 mg/dL Final   LDL Chol Calc (NIH)  Date Value Ref Range Status  05/06/2021 41 0 - 99 mg/dL Final   HDL  Date Value Ref Range Status  05/06/2021 47 >39 mg/dL Final   Triglycerides  Date Value Ref Range Status  05/06/2021 204 (H) 0 - 149 mg/dL Final         Passed - Patient is not pregnant      Passed - Valid encounter within last 12 months    Recent Outpatient Visits          4 months ago Type II diabetes mellitus with complication [facility])   [facility] Glean [facility], MD   8 months ago Type II diabetes mellitus with complication [facility])   [facility], [facility]   1 year ago Essential (primary) hypertension   [facility] Glean Hess, MD   1 year ago Type II diabetes mellitus with complication [facility])   [facility] Glean Hess, MD   1 year ago Type II diabetes mellitus with complication [facility])   [facility] Glean Hess, MD      Future Appointments            In 4 days Glean Hess, MD [facility], [facility]

## 2022-06-10 ENCOUNTER — Ambulatory Visit (INDEPENDENT_AMBULATORY_CARE_PROVIDER_SITE_OTHER): Payer: Medicare Other | Admitting: Internal Medicine

## 2022-06-10 ENCOUNTER — Encounter: Payer: Self-pay | Admitting: Internal Medicine

## 2022-06-10 VITALS — BP 124/62 | HR 59 | Ht 68.0 in | Wt 179.0 lb

## 2022-06-10 DIAGNOSIS — E118 Type 2 diabetes mellitus with unspecified complications: Secondary | ICD-10-CM

## 2022-06-10 DIAGNOSIS — E785 Hyperlipidemia, unspecified: Secondary | ICD-10-CM

## 2022-06-10 DIAGNOSIS — E1169 Type 2 diabetes mellitus with other specified complication: Secondary | ICD-10-CM

## 2022-06-10 DIAGNOSIS — Z1231 Encounter for screening mammogram for malignant neoplasm of breast: Secondary | ICD-10-CM | POA: Diagnosis not present

## 2022-06-10 DIAGNOSIS — I10 Essential (primary) hypertension: Secondary | ICD-10-CM

## 2022-06-10 DIAGNOSIS — C44712 Basal cell carcinoma of skin of right lower limb, including hip: Secondary | ICD-10-CM

## 2022-06-10 MED ORDER — SIMVASTATIN 80 MG PO TABS: ORAL_TABLET | ORAL | 1 refills | Status: DC

## 2022-06-10 NOTE — Progress Notes (Signed)
Date:  06/10/2022   Name:  Kimberly Salazar   DOB:  Nov 04, 1945   MRN:  [id number]   Chief Complaint: Annual Exam (Breast exam no pap ) Kimberly Salazar is a 76 y.o. female who presents today for her Complete Annual Exam. She feels well. She reports exercising none. She reports she is sleeping well. Breast complaints none.  She is working on her diet and has lost 10 lbs.  Mammogram: 04/2021 DEXA: 04/2021 Normal Pap smear: discontinued Colonoscopy: 04/2018 2 TA - repeat 5 yrs  Health Maintenance Due  Topic Date Due   Zoster Vaccines- Shingrix (1 of 2) Never done   TETANUS/TDAP  06/01/2021   OPHTHALMOLOGY EXAM  01/22/2022   MAMMOGRAM  05/15/2022   INFLUENZA VACCINE  05/27/2022    Immunization History  Administered Date(s) Administered   Fluad Quad(high Dose 65+) 09/08/2019, 09/13/2020, 09/10/2021   Influenza, High Dose Seasonal PF 09/30/2018   Influenza,inj,Quad PF,6+ Mos 11/19/2015, 07/15/2016   Influenza-Unspecified 06/27/2017   Moderna Sars-Covid-2 Vaccination 12/22/2019, 01/19/2020   Pneumococcal Conjugate-13 09/15/2014   Pneumococcal Polysaccharide-23 06/02/2011   Tdap 06/02/2011   Zoster, Live 10/27/2006    Hypertension This is a chronic problem. The problem is controlled. Pertinent negatives include no chest pain, headaches, palpitations or shortness of breath. Past treatments include beta blockers, angiotensin blockers and diuretics. The current treatment provides significant improvement. Hypertensive end-organ damage includes kidney disease. There is no history of CAD/MI or CVA.  Diabetes She presents for her follow-up diabetic visit. She has type 2 diabetes mellitus. Her disease course has been stable. Pertinent negatives for hypoglycemia include no dizziness, headaches, nervousness/anxiousness or tremors. Pertinent negatives for diabetes include no chest pain, no fatigue, no polydipsia and no polyuria. Pertinent negatives for diabetic complications include no CVA. Current  diabetic treatment includes oral agent (dual therapy) (metformin, glimepiride, Ozempic). She is compliant with treatment all of the time. An ACE inhibitor/angiotensin II receptor blocker is being taken. She does not see a podiatrist.Eye exam is not current.  Hyperlipidemia This is a chronic problem. The problem is controlled. Pertinent negatives include no chest pain or shortness of breath. Current antihyperlipidemic treatment includes statins. The current treatment provides significant improvement of lipids.    Lab Results  Component Value Date   NA 140 01/08/2022   K 4.7 01/08/2022   CO2 22 01/08/2022   GLUCOSE 236 (H) 01/08/2022   BUN 18 01/08/2022   CREATININE 1.07 (H) 01/08/2022   CALCIUM 10.3 01/08/2022   EGFR 54 (L) 01/08/2022   GFRNONAA 57 (L) 09/13/2020   Lab Results  Component Value Date   CHOL 121 05/06/2021   HDL 47 05/06/2021   LDLCALC 41 05/06/2021   TRIG 204 (H) 05/06/2021   CHOLHDL 2.6 05/06/2021   Lab Results  Component Value Date   TSH 2.720 05/06/2021   Lab Results  Component Value Date   HGBA1C 7.0 (H) 01/08/2022   Lab Results  Component Value Date   WBC 6.0 05/06/2021   HGB 13.5 05/06/2021   HCT 40.2 05/06/2021   MCV 93 05/06/2021   PLT 169 05/06/2021   Lab Results  Component Value Date   ALT 20 05/06/2021   AST 26 05/06/2021   ALKPHOS 53 05/06/2021   BILITOT 0.6 05/06/2021   No results found for: "25OHVITD2", "25OHVITD3", "VD25OH"   Review of Systems  Constitutional:  Negative for chills, fatigue and fever.  HENT:  Negative for congestion, hearing loss, tinnitus, trouble swallowing and voice change.   Eyes:  Negative  for visual disturbance.  Respiratory:  Negative for cough, chest tightness, shortness of breath and wheezing.   Cardiovascular:  Negative for chest pain, palpitations and leg swelling.  Gastrointestinal:  Negative for abdominal pain, constipation, diarrhea and vomiting.  Endocrine: Negative for polydipsia and polyuria.   Genitourinary:  Negative for dysuria, frequency, genital sores, vaginal bleeding and vaginal discharge.  Musculoskeletal:  Negative for arthralgias, gait problem and joint swelling.  Skin:  Negative for color change and rash.  Neurological:  Negative for dizziness, tremors, light-headedness and headaches.  Hematological:  Negative for adenopathy. Does not bruise/bleed easily.  Psychiatric/Behavioral:  Negative for dysphoric mood and sleep disturbance. The patient is not nervous/anxious.     Patient Active Problem List   Diagnosis Date Noted   Ovarian failure 04/09/2017   Basal cell carcinoma, leg 07/15/2016   Abnormal finding on thallium stress test 05/17/2015   Hyperlipidemia associated with type 2 diabetes mellitus ([place]) 04/12/2015   Essential (primary) hypertension 04/12/2015   History of uterine cancer 04/12/2015   Type II diabetes mellitus with complication ([place]) 02/40/9735   Cardiac pacemaker in situ 10/11/2009    No Known Allergies  Past Surgical History:  Procedure Laterality Date   ABDOMINAL HYSTERECTOMY  1994   Uterine CA   APPENDECTOMY     CATARACT EXTRACTION Left 2011   LITHOTRIPSY  2005   PACEMAKER INSERTION  2010    Social History   Tobacco Use   Smoking status: Never   Smokeless tobacco: Never  Vaping Use   Vaping Use: Never used  Substance Use Topics   Alcohol use: No    Alcohol/week: 0.0 standard drinks of alcohol   Drug use: No     Medication list has been reviewed and updated.  Current Meds  Medication Sig   aspirin 81 MG chewable tablet Chew 1 tablet by mouth daily.   cholecalciferol (VITAMIN D3) 25 MCG (1000 UT) tablet Take 1,000 Units by mouth daily.   glimepiride (AMARYL) 2 MG tablet TAKE 1 TABLET BY MOUTH TWICE DAILY WITH FOOD   glucose blood (ONE TOUCH ULTRA TEST) test strip 1 each by Other route 2 (two) times daily.   metFORMIN (GLUCOPHAGE-XR) 500 MG 24 hr tablet TAKE 2 TABLETS(1000 MG) BY MOUTH IN THE MORNING AND AT BEDTIME    metoprolol succinate (TOPROL-XL) 25 MG 24 hr tablet TAKE 1 TABLET BY MOUTH EVERY DAY   Multiple Vitamins-Minerals (MULTIVITAL) tablet Take 1 tablet by mouth daily.   olmesartan-hydrochlorothiazide (BENICAR HCT) 20-12.5 MG tablet TAKE 1 TABLET BY MOUTH DAILY   omega-3 acid ethyl esters (LOVAZA) 1 g capsule TAKE 4 CAPSULES BY MOUTH EVERY DAY   OZEMPIC, 0.25 OR 0.5 MG/DOSE, 2 MG/3ML SOPN INJECT 0.5MG UNDER THE SKIN ONCE A WEEK   simvastatin (ZOCOR) 80 MG tablet TAKE 1 TABLET(80 MG) BY MOUTH DAILY       06/10/2022   10:41 AM 01/08/2022    9:52 AM 09/10/2021    9:54 AM 05/06/2021    8:49 AM  GAD 7 : Generalized Anxiety Score  Nervous, Anxious, on Edge 0 0 0 0  Control/stop worrying 0 0 0 0  Worry too much - different things 0 0 0 0  Trouble relaxing 0 0 0 0  Restless 0 0 0 0  Easily annoyed or irritable 0 0 0 0  Afraid - awful might happen 0 0 0 0  Total GAD 7 Score 0 0 0 0  Anxiety Difficulty Not difficult at all Not difficult at all Not difficult  at all Not difficult at all       06/10/2022   10:41 AM 01/08/2022    9:52 AM 10/02/2021    2:47 PM  Depression screen PHQ 2/9  Decreased Interest 0 0 0  Down, Depressed, Hopeless 0 0 0  PHQ - 2 Score 0 0 0  Altered sleeping 0 0   Tired, decreased energy 0 0   Change in appetite 0 0   Feeling bad or failure about yourself  0 0   Trouble concentrating 0 0   Moving slowly or fidgety/restless 0 0   Suicidal thoughts 0 0   PHQ-9 Score 0 0   Difficult doing work/chores Not difficult at all Not difficult at all     BP Readings from Last 3 Encounters:  06/10/22 124/62  01/08/22 124/70  09/10/21 128/74    Physical Exam Vitals and nursing note reviewed.  Constitutional:      General: She is not in acute distress.    Appearance: She is well-developed.  HENT:     Head: Normocephalic and atraumatic.     Right Ear: Tympanic membrane and ear canal normal.     Left Ear: Tympanic membrane and ear canal normal.     Nose:     Right Sinus:  No maxillary sinus tenderness.     Left Sinus: No maxillary sinus tenderness.  Eyes:     General: No scleral icterus.       Right eye: No discharge.        Left eye: No discharge.     Conjunctiva/sclera: Conjunctivae normal.  Neck:     Thyroid: No thyromegaly.     Vascular: No carotid bruit.  Cardiovascular:     Rate and Rhythm: Normal rate and regular rhythm.     Pulses: Normal pulses.     Heart sounds: Normal heart sounds.  Pulmonary:     Effort: Pulmonary effort is normal. No respiratory distress.     Breath sounds: No wheezing.  Chest:  Breasts:    Right: No mass, nipple discharge, skin change or tenderness.     Left: No mass, nipple discharge, skin change or tenderness.  Abdominal:     General: Bowel sounds are normal.     Palpations: Abdomen is soft.     Tenderness: There is no abdominal tenderness.  Musculoskeletal:     Cervical back: Normal range of motion. No erythema.     Right lower leg: No edema.     Left lower leg: No edema.  Lymphadenopathy:     Cervical: No cervical adenopathy.  Skin:    General: Skin is warm and dry.     Findings: Wound (punch biopsy right lower leg healing) present. No rash.  Neurological:     Mental Status: She is alert and oriented to person, place, and time.     Cranial Nerves: No cranial nerve deficit.     Sensory: No sensory deficit.     Deep Tendon Reflexes: Reflexes are normal and symmetric.  Psychiatric:        Attention and Perception: Attention normal.        Mood and Affect: Mood normal.     Wt Readings from Last 3 Encounters:  06/10/22 179 lb (81.2 kg)  01/08/22 188 lb 12.8 oz (85.6 kg)  09/10/21 182 lb 6.4 oz (82.7 kg)    BP 124/62   Pulse (!) 59   Ht _0  (1.727 m)   Wt 179 lb (81.2 kg)   SpO2  98%   BMI 27.22 kg/m   Assessment and Plan: 1. Essential (primary) hypertension Clinically stable exam with well controlled BP. Tolerating medications without side effects at this time. Pt to continue current regimen  and low sodium diet; benefits of regular exercise as able discussed. - CBC with Differential/Platelet - TSH  2. Encounter for screening mammogram for breast cancer Schedule at [place]  3. Hyperlipidemia associated with type 2 diabetes mellitus ([place]) Tolerating statin medication without side effects at this time LDL is at goal of < 70 on current dose Continue same therapy without change at this time.  Has been out of medications for three days so values may be higher. - Lipid panel - simvastatin (ZOCOR) 80 MG tablet; TAKE 1 TABLET(80 MG) BY MOUTH DAILY  Dispense: 90 tablet; Refill: 1  4. Type II diabetes mellitus with complication (HCC) Clinically stable by exam and report without s/s of hypoglycemia. DM complicated by hypertension and dyslipidemia. Tolerating medications well without side effects or other concerns. Would like to come off of Ozempic due to cost. Reminded to schedule DM Eye exam. - Comprehensive metabolic panel - Hemoglobin A1c - Microalbumin / creatinine urine ratio  5. Basal cell carcinoma (BCC) of right lower extremity Being followed by Dr. Magda Kiel in [facility].   Partially dictated using [employer]. Any errors are unintentional.  Halina Maidens, MD [facility]  06/10/2022

## 2022-06-11 ENCOUNTER — Other Ambulatory Visit: Payer: Self-pay | Admitting: Internal Medicine

## 2022-06-11 LAB — LIPID PANEL
Chol/HDL Ratio: 3.3 ratio (ref 0.0–4.4)
Cholesterol, Total: 173 mg/dL (ref 100–199)
HDL: 52 mg/dL (ref >39)
LDL Chol Calc (NIH): 86 mg/dL (ref 0–99)
Triglycerides: 206 mg/dL — ABNORMAL HIGH (ref 0–149)
VLDL Cholesterol Cal: 35 mg/dL (ref 5–40)

## 2022-06-11 LAB — COMPREHENSIVE METABOLIC PANEL
ALT: 22 IU/L (ref 0–32)
AST: 26 IU/L (ref 0–40)
Albumin/Globulin Ratio: 2 (ref 1.2–2.2)
Albumin: 4.5 g/dL (ref 3.8–4.8)
Alkaline Phosphatase: 54 IU/L (ref 44–121)
BUN/Creatinine Ratio: 21 (ref 12–28)
BUN: 22 mg/dL (ref 8–27)
Bilirubin Total: 0.6 mg/dL (ref 0.0–1.2)
CO2: 19 mmol/L — ABNORMAL LOW (ref 20–29)
Calcium: 10.4 mg/dL — ABNORMAL HIGH (ref 8.7–10.3)
Chloride: 99 mmol/L (ref 96–106)
Creatinine, Ser: 1.03 mg/dL — ABNORMAL HIGH (ref 0.57–1.00)
Globulin, Total: 2.2 g/dL (ref 1.5–4.5)
Glucose: 214 mg/dL — ABNORMAL HIGH (ref 70–99)
Potassium: 5 mmol/L (ref 3.5–5.2)
Sodium: 140 mmol/L (ref 134–144)
Total Protein: 6.7 g/dL (ref 6.0–8.5)
eGFR: 56 mL/min/{1.73_m2} — ABNORMAL LOW (ref >59)

## 2022-06-11 LAB — TSH: TSH: 3.24 u[IU]/mL (ref 0.450–4.500)

## 2022-06-11 LAB — CBC WITH DIFFERENTIAL/PLATELET
Basophils Absolute: 0 10*3/uL (ref 0.0–0.2)
Basos: 1 %
EOS (ABSOLUTE): 0.1 10*3/uL (ref 0.0–0.4)
Eos: 1 %
Hematocrit: 43 % (ref 34.0–46.6)
Hemoglobin: 14 g/dL (ref 11.1–15.9)
Immature Grans (Abs): 0 10*3/uL (ref 0.0–0.1)
Immature Granulocytes: 0 %
Lymphocytes Absolute: 1.6 10*3/uL (ref 0.7–3.1)
Lymphs: 22 %
MCH: 29.8 pg (ref 26.6–33.0)
MCHC: 32.6 g/dL (ref 31.5–35.7)
MCV: 92 fL (ref 79–97)
Monocytes Absolute: 0.4 10*3/uL (ref 0.1–0.9)
Monocytes: 5 %
Neutrophils Absolute: 5.2 10*3/uL (ref 1.4–7.0)
Neutrophils: 71 %
Platelets: 201 10*3/uL (ref 150–450)
RBC: 4.7 x10E6/uL (ref 3.77–5.28)
RDW: 12.5 % (ref 11.7–15.4)
WBC: 7.3 10*3/uL (ref 3.4–10.8)

## 2022-06-11 LAB — MICROALBUMIN / CREATININE URINE RATIO
Creatinine, Urine: 272.6 mg/dL
Microalb/Creat Ratio: 66 mg/g creat — ABNORMAL HIGH (ref 0–29)
Microalbumin, Urine: 179.2 ug/mL

## 2022-06-11 LAB — HEMOGLOBIN A1C
Est. average glucose Bld gHb Est-mCnc: 143 mg/dL
Hgb A1c MFr Bld: 6.6 % — ABNORMAL HIGH (ref 4.8–5.6)

## 2022-06-11 NOTE — Telephone Encounter (Signed)
Requested Prescriptions  Pending Prescriptions Disp Refills  . OZEMPIC, 0.25 OR 0.5 MG/DOSE, 2 MG/3ML SOPN [Pharmacy Med Name: OZEMPIC 0.25 OR 0.'5MG'$ /DOS1X'2MG'$  3ML] 3 mL 1    Sig: INJECT 0.'5MG'$  UNDER THE SKIN ONE DAY A WEEK     Endocrinology:  Diabetes - GLP-1 Receptor Agonists - semaglutide Failed - 06/11/2022 10:17 AM      Failed - HBA1C in normal range and within 180 days    Hgb A1c MFr Bld  Date Value Ref Range Status  01/08/2022 7.0 (H) 4.8 - 5.6 % Final    Comment:             Prediabetes: 5.7 - 6.4          Diabetes: >6.4          Glycemic control for adults with diabetes: <7.0          Failed - Cr in normal range and within 360 days    Creatinine, Ser  Date Value Ref Range Status  01/08/2022 1.07 (H) 0.57 - 1.00 mg/dL Final         Passed - Valid encounter within last 6 months    Recent Outpatient Visits          Yesterday Essential (primary) hypertension   [place] [facility] at [facility], Jesse Sans, MD   5 months ago Type II diabetes mellitus with complication [facility])   [place] [facility] and [facility] Medicine at [facility], Jesse Sans, MD   9 months ago Type II diabetes mellitus with complication [facility])   [place] [facility] and [facility] Medicine at [facility], Jesse Sans, MD   1 year ago Essential (primary) hypertension   [place] [facility] and Sports Medicine at [facility], Jesse Sans, MD   1 year ago Type II diabetes mellitus with complication [facility])   [place] [facility] and Sports Medicine at [facility], Jesse Sans, MD      Future Appointments            In 4 months Army Melia, Jesse Sans, MD [place] and Sports Medicine at [facility]

## 2022-08-11 ENCOUNTER — Other Ambulatory Visit: Payer: Self-pay | Admitting: Internal Medicine

## 2022-08-11 DIAGNOSIS — I10 Essential (primary) hypertension: Secondary | ICD-10-CM

## 2022-08-12 NOTE — Telephone Encounter (Signed)
Requested Prescriptions  Pending Prescriptions Disp Refills  . OZEMPIC, 0.25 OR 0.5 MG/DOSE, 2 MG/3ML SOPN [Pharmacy Med Name: OZEMPIC 0.25 OR 0.5MG/DOS1X2MG 3ML] 3 mL 1    Sig: INJECT 0.5 MG UNDER THE SKIN ONE DAY A WEEK     Endocrinology:  Diabetes - GLP-1 Receptor Agonists - semaglutide Failed - 08/11/2022  1:20 PM      Failed - HBA1C in normal range and within 180 days    Hgb A1c MFr Bld  Date Value Ref Range Status  06/10/2022 6.6 (H) 4.8 - 5.6 % Final    Comment:             Prediabetes: 5.7 - 6.4          Diabetes: >6.4          Glycemic control for adults with diabetes: <7.0          Failed - Cr in normal range and within 360 days    Creatinine, Ser  Date Value Ref Range Status  06/10/2022 1.03 (H) 0.57 - 1.00 mg/dL Final         Passed - Valid encounter within last 6 months    Recent Outpatient Visits          2 months ago Essential (primary) hypertension   [place] [facility] at [facility], Jesse Sans, MD   7 months ago Type II diabetes mellitus with complication [facility])   [place] [facility] and [facility] Medicine at [facility], Jesse Sans, MD   11 months ago Type II diabetes mellitus with complication [facility])   [place] and Sports Medicine at [facility], Jesse Sans, MD   1 year ago Essential (primary) hypertension   [place] [facility] and [facility] Medicine at [facility], Jesse Sans, MD   1 year ago Type II diabetes mellitus with complication [facility])   [place] [facility] and Sports Medicine at [facility], Jesse Sans, MD      Future Appointments            In 1 month Army Melia, Jesse Sans, MD [facility] Primary Care and Sports Medicine at [facility]           . olmesartan-hydrochlorothiazide (BENICAR HCT) 20-12.5 MG tablet [Pharmacy Med Name: OLMESARTAN MEDOX/HCTZ 20-12.5MG TAB] 90 tablet 1    Sig: TAKE 1 TABLET BY MOUTH DAILY      Cardiovascular: ARB + Diuretic Combos Failed - 08/11/2022  1:20 PM      Failed - Cr in normal range and within 180 days    Creatinine, Ser  Date Value Ref Range Status  06/10/2022 1.03 (H) 0.57 - 1.00 mg/dL Final         Passed - K in normal range and within 180 days    Potassium  Date Value Ref Range Status  06/10/2022 5.0 3.5 - 5.2 mmol/L Final         Passed - Na in normal range and within 180 days    Sodium  Date Value Ref Range Status  06/10/2022 140 134 - 144 mmol/L Final         Passed - eGFR is 10 or above and within 180 days    GFR calc Af Amer  Date Value Ref Range Status  09/13/2020 66 >59 mL/min/1.73 Final    Comment:    **In accordance with recommendations from the NKF-ASN Task force,**   [facility] is in  the process of updating its eGFR calculation to the   2021 CKD-EPI creatinine equation that estimates kidney function   without a race variable.    GFR calc non Af Amer  Date Value Ref Range Status  09/13/2020 57 (L) >59 mL/min/1.73 Final   eGFR  Date Value Ref Range Status  06/10/2022 56 (L) >59 mL/min/1.73 Final         Passed - Patient is not pregnant      Passed - Last BP in normal range    BP Readings from Last 1 Encounters:  06/10/22 124/62         Passed - Valid encounter within last 6 months    Recent Outpatient Visits          2 months ago Essential (primary) hypertension   [place] [facility] at [facility], Jesse Sans, MD   7 months ago Type II diabetes mellitus with complication [facility])   [place] [facility] and Sports Medicine at [facility], Jesse Sans, MD   11 months ago Type II diabetes mellitus with complication [facility])   [place] [facility] Care and Sports Medicine at [facility], Jesse Sans, MD   1 year ago Essential (primary) hypertension   [place] Primary Care and Sports Medicine at [facility], Jesse Sans, MD   1 year ago Type II diabetes mellitus with  complication [facility])    Primary Care and Sports Medicine at [facility], Jesse Sans, MD      Future Appointments            In 1 month Army Melia, Jesse Sans, MD [place] and Sports Medicine at [facility]

## 2022-08-25 ENCOUNTER — Other Ambulatory Visit: Payer: Self-pay | Admitting: Internal Medicine

## 2022-08-26 ENCOUNTER — Other Ambulatory Visit: Payer: Self-pay | Admitting: Internal Medicine

## 2022-08-26 NOTE — Telephone Encounter (Signed)
Requested Prescriptions  Pending Prescriptions Disp Refills  . glimepiride (AMARYL) 2 MG tablet [Pharmacy Med Name: GLIMEPIRIDE '2MG'$  TABLETS] 180 tablet 1    Sig: TAKE 1 TABLET BY MOUTH TWICE DAILY WITH FOOD     Endocrinology:  Diabetes - Sulfonylureas Failed - 08/26/2022  6:15 AM      Failed - Cr in normal range and within 360 days    Creatinine, Ser  Date Value Ref Range Status  06/10/2022 1.03 (H) 0.57 - 1.00 mg/dL Final         Passed - HBA1C is between 0 and 7.9 and within 180 days    Hgb A1c MFr Bld  Date Value Ref Range Status  06/10/2022 6.6 (H) 4.8 - 5.6 % Final    Comment:             Prediabetes: 5.7 - 6.4          Diabetes: >6.4          Glycemic control for adults with diabetes: <7.0          Passed - Valid encounter within last 6 months    Recent Outpatient Visits          2 months ago Essential (primary) hypertension   [place] [facility] at [facility], Jesse Sans, MD   7 months ago Type II diabetes mellitus with complication [facility])   [place] [facility] and Sports Medicine at [facility], Jesse Sans, MD   11 months ago Type II diabetes mellitus with complication [facility])   [place] and Sports Medicine at [facility], Jesse Sans, MD   1 year ago Essential (primary) hypertension   [place] [facility] Care and Sports Medicine at [facility], Jesse Sans, MD   1 year ago Type II diabetes mellitus with complication [facility])   [place] and Sports Medicine at [facility], Jesse Sans, MD      Future Appointments            In 1 month Glean Hess, MD [facility] Primary Care and Sports Medicine at [facility], [facility]           . metoprolol succinate (TOPROL-XL) 25 MG 24 hr tablet [Pharmacy Med Name: METOPROLOL ER SUCCINATE '25MG'$  TABS] 90 tablet 1    Sig: TAKE 1 TABLET BY MOUTH EVERY DAY     Cardiovascular:  Beta Blockers Passed - 08/26/2022  6:15  AM      Passed - Last BP in normal range    BP Readings from Last 1 Encounters:  06/10/22 124/62         Passed - Last Heart Rate in normal range    Pulse Readings from Last 1 Encounters:  06/10/22 (!) 9         Passed - Valid encounter within last 6 months    Recent Outpatient Visits          2 months ago Essential (primary) hypertension   [place] [facility] Medicine at [facility], Jesse Sans, MD   7 months ago Type II diabetes mellitus with complication [facility])   [place] Primary Care and Sports Medicine at [facility], Jesse Sans, MD   11 months ago Type II diabetes mellitus with complication [facility])   [place] and Sports Medicine at [facility], Jesse Sans, MD   1 year ago Essential (primary) hypertension  [place] Primary Care and Sports Medicine at [facility], Jesse Sans, MD   1 year ago Type II diabetes mellitus with complication [facility])   [place] Primary Care and Sports Medicine at [facility], Jesse Sans, MD      Future Appointments            In 1 month Army Melia, Jesse Sans, MD [facility] Primary Care and Sports Medicine at [facility], [facility]

## 2022-08-26 NOTE — Telephone Encounter (Signed)
Requested Prescriptions  Pending Prescriptions Disp Refills  . omega-3 acid ethyl esters (LOVAZA) 1 g capsule [Pharmacy Med Name: OMEGA-3-ACID 1GM CAPSULES (RX)] 120 capsule 2    Sig: TAKE [place] DAY     Endocrinology:  Nutritional Agents - omega-3 acid ethyl esters Failed - 08/25/2022 12:46 PM      Failed - Lipid Panel in normal range within the last 12 months    Cholesterol, Total  Date Value Ref Range Status  06/10/2022 173 100 - 199 mg/dL Final   LDL Chol Calc (NIH)  Date Value Ref Range Status  06/10/2022 86 0 - 99 mg/dL Final   HDL  Date Value Ref Range Status  06/10/2022 52 >39 mg/dL Final   Triglycerides  Date Value Ref Range Status  06/10/2022 206 (H) 0 - 149 mg/dL Final         Passed - Valid encounter within last 12 months    Recent Outpatient Visits          2 months ago Essential (primary) hypertension   [place] [facility] and [facility] Medicine at [facility], Jesse Sans, MD   7 months ago Type II diabetes mellitus with complication [facility])   [place] [facility] Care and Sports Medicine at [facility], Jesse Sans, MD   11 months ago Type II diabetes mellitus with complication [facility])   [place] Primary Care and Sports Medicine at [facility], Jesse Sans, MD   1 year ago Essential (primary) hypertension   [place] [facility] Care and Sports Medicine at [facility], Jesse Sans, MD   1 year ago Type II diabetes mellitus with complication [facility])   [place] [facility] Care and Sports Medicine at [facility], Jesse Sans, MD      Future Appointments            In 1 month Army Melia, Jesse Sans, MD [facility] Primary Care and Sports Medicine at [facility], [facility]

## 2022-09-11 ENCOUNTER — Ambulatory Visit
Admission: RE | Admit: 2022-09-11 | Discharge: 2022-09-11 | Disposition: A | Discharge status: Home / Self Care | Payer: Medicare Other | Source: Ambulatory Visit | Attending: Internal Medicine | Admitting: Internal Medicine

## 2022-09-11 DIAGNOSIS — Z1231 Encounter for screening mammogram for malignant neoplasm of breast: Secondary | ICD-10-CM | POA: Diagnosis present

## 2022-10-06 ENCOUNTER — Ambulatory Visit: Payer: Medicare Other

## 2022-10-10 ENCOUNTER — Ambulatory Visit (INDEPENDENT_AMBULATORY_CARE_PROVIDER_SITE_OTHER): Payer: Medicare Other | Admitting: Internal Medicine

## 2022-10-10 ENCOUNTER — Encounter: Payer: Self-pay | Admitting: Internal Medicine

## 2022-10-10 ENCOUNTER — Ambulatory Visit: Payer: Medicare Other

## 2022-10-10 VITALS — BP 114/64 | HR 64 | Ht 68.0 in | Wt 185.0 lb

## 2022-10-10 DIAGNOSIS — E118 Type 2 diabetes mellitus with unspecified complications: Secondary | ICD-10-CM

## 2022-10-10 DIAGNOSIS — Z23 Encounter for immunization: Secondary | ICD-10-CM | POA: Diagnosis not present

## 2022-10-10 DIAGNOSIS — I10 Essential (primary) hypertension: Secondary | ICD-10-CM

## 2022-10-10 LAB — POCT GLYCOSYLATED HEMOGLOBIN (HGB A1C): Hemoglobin A1C: 7.2 % — AB (ref 4.0–5.6)

## 2022-10-10 NOTE — Progress Notes (Signed)
Date:  10/10/2022   Name:  Kimberly Salazar   DOB:  07-Mar-1946   MRN:  [id number]   Chief Complaint: No chief complaint on file.  Diabetes She presents for her follow-up diabetic visit. She has type 2 diabetes mellitus. Her disease course has been stable. Pertinent negatives for hypoglycemia include no headaches or tremors. Pertinent negatives for diabetes include no chest pain, no fatigue, no polydipsia and no polyuria. Current diabetic treatments: metformin, glimepiride, ozempic (out for past 2 months) An ACE inhibitor/angiotensin II receptor blocker is being taken. Eye exam is not current.  Hypertension This is a chronic problem. The problem is controlled. Pertinent negatives include no chest pain, headaches, palpitations or shortness of breath. Past treatments include angiotensin blockers, beta blockers and diuretics. The current treatment provides significant improvement.    Lab Results  Component Value Date   NA 140 06/10/2022   K 5.0 06/10/2022   CO2 19 (L) 06/10/2022   GLUCOSE 214 (H) 06/10/2022   BUN 22 06/10/2022   CREATININE 1.03 (H) 06/10/2022   CALCIUM 10.4 (H) 06/10/2022   EGFR 56 (L) 06/10/2022   GFRNONAA 57 (L) 09/13/2020   Lab Results  Component Value Date   CHOL 173 06/10/2022   HDL 52 06/10/2022   LDLCALC 86 06/10/2022   TRIG 206 (H) 06/10/2022   CHOLHDL 3.3 06/10/2022   Lab Results  Component Value Date   TSH 3.240 06/10/2022   Lab Results  Component Value Date   HGBA1C 6.6 (H) 06/10/2022   Lab Results  Component Value Date   WBC 7.3 06/10/2022   HGB 14.0 06/10/2022   HCT 43.0 06/10/2022   MCV 92 06/10/2022   PLT 201 06/10/2022   Lab Results  Component Value Date   ALT 22 06/10/2022   AST 26 06/10/2022   ALKPHOS 54 06/10/2022   BILITOT 0.6 06/10/2022   No results found for: "25OHVITD2", "25OHVITD3", "VD25OH"   Review of Systems  Constitutional:  Negative for appetite change, fatigue, fever and unexpected weight change.  HENT:  Positive  for tinnitus (roaring in ears and some hearing loss). Negative for trouble swallowing.   Eyes:  Negative for visual disturbance.  Respiratory:  Negative for cough, chest tightness and shortness of breath.   Cardiovascular:  Negative for chest pain, palpitations and leg swelling.  Gastrointestinal:  Negative for abdominal pain.  Endocrine: Negative for polydipsia and polyuria.  Genitourinary:  Negative for dysuria and hematuria.  Musculoskeletal:  Negative for arthralgias.  Neurological:  Negative for tremors, numbness and headaches.  Psychiatric/Behavioral:  Negative for dysphoric mood.     Patient Active Problem List   Diagnosis Date Noted   Ovarian failure 04/09/2017   Basal cell carcinoma, leg 07/15/2016   Abnormal finding on thallium stress test 05/17/2015   Hyperlipidemia associated with type 2 diabetes mellitus ([place]) 04/12/2015   Essential (primary) hypertension 04/12/2015   History of uterine cancer 04/12/2015   Type II diabetes mellitus with complication ([place]) 25/49/8264   Cardiac pacemaker in situ 10/11/2009    No Known Allergies  Past Surgical History:  Procedure Laterality Date   ABDOMINAL HYSTERECTOMY  1994   Uterine CA   APPENDECTOMY     CATARACT EXTRACTION Left 2011   LITHOTRIPSY  2005   PACEMAKER INSERTION  2010    Social History   Tobacco Use   Smoking status: Never   Smokeless tobacco: Never  Vaping Use   Vaping Use: Never used  Substance Use Topics   Alcohol use: No  Alcohol/week: 0.0 standard drinks of alcohol   Drug use: No     Medication list has been reviewed and updated.  Current Meds  Medication Sig   aspirin 81 MG chewable tablet Chew 1 tablet by mouth daily.   cholecalciferol (VITAMIN D3) 25 MCG (1000 UT) tablet Take 1,000 Units by mouth daily.   glimepiride (AMARYL) 2 MG tablet TAKE 1 TABLET BY MOUTH TWICE DAILY WITH FOOD   glucose blood (ONE TOUCH ULTRA TEST) test strip 1 each by Other route 2 (two) times daily.   metFORMIN  (GLUCOPHAGE-XR) 500 MG 24 hr tablet TAKE 2 TABLETS(1000 MG) BY MOUTH IN THE MORNING AND AT BEDTIME   metoprolol succinate (TOPROL-XL) 25 MG 24 hr tablet TAKE 1 TABLET BY MOUTH EVERY DAY   Multiple Vitamins-Minerals (MULTIVITAL) tablet Take 1 tablet by mouth daily.   olmesartan-hydrochlorothiazide (BENICAR HCT) 20-12.5 MG tablet TAKE 1 TABLET BY MOUTH DAILY   omega-3 acid ethyl esters (LOVAZA) 1 g capsule TAKE 4 CAPSULES BY MOUTH EVERY DAY       10/10/2022    9:55 AM 06/10/2022   10:41 AM 01/08/2022    9:52 AM 09/10/2021    9:54 AM  GAD 7 : Generalized Anxiety Score  Nervous, Anxious, on Edge 0 0 0 0  Control/stop worrying 0 0 0 0  Worry too much - different things 0 0 0 0  Trouble relaxing 0 0 0 0  Restless 0 0 0 0  Easily annoyed or irritable 0 0 0 0  Afraid - awful might happen 0 0 0 0  Total GAD 7 Score 0 0 0 0  Anxiety Difficulty Not difficult at all Not difficult at all Not difficult at all Not difficult at all       10/10/2022    9:55 AM 06/10/2022   10:41 AM 01/08/2022    9:52 AM  Depression screen PHQ 2/9  Decreased Interest 0 0 0  Down, Depressed, Hopeless 0 0 0  PHQ - 2 Score 0 0 0  Altered sleeping 0 0 0  Tired, decreased energy 0 0 0  Change in appetite 0 0 0  Feeling bad or failure about yourself  0 0 0  Trouble concentrating 0 0 0  Moving slowly or fidgety/restless 0 0 0  Suicidal thoughts 0 0 0  PHQ-9 Score 0 0 0  Difficult doing work/chores Not difficult at all Not difficult at all Not difficult at all    BP Readings from Last 3 Encounters:  10/10/22 114/64  06/10/22 124/62  01/08/22 124/70    Physical Exam Vitals and nursing note reviewed.  Constitutional:      General: She is not in acute distress.    Appearance: She is well-developed.  HENT:     Head: Normocephalic and atraumatic.  Neck:     Vascular: No carotid bruit.  Cardiovascular:     Rate and Rhythm: Normal rate and regular rhythm. Occasional Extrasystoles are present.    Heart  sounds: No murmur heard. Pulmonary:     Effort: Pulmonary effort is normal. No respiratory distress.     Breath sounds: No wheezing or rhonchi.  Musculoskeletal:     Cervical back: Normal range of motion.     Right lower leg: No edema.     Left lower leg: No edema.  Skin:    General: Skin is warm and dry.     Capillary Refill: Capillary refill takes less than 2 seconds.     Findings: No rash.  Neurological:     General: No focal deficit present.     Mental Status: She is alert and oriented to person, place, and time.  Psychiatric:        Mood and Affect: Mood normal.        Behavior: Behavior normal.     Wt Readings from Last 3 Encounters:  10/10/22 185 lb (83.9 kg)  06/10/22 179 lb (81.2 kg)  01/08/22 188 lb 12.8 oz (85.6 kg)    BP 114/64   Pulse 64   Ht _0  (1.727 m)   Wt 185 lb (83.9 kg)   SpO2 97%   BMI 28.13 kg/m   Assessment and Plan: 1. Type II diabetes mellitus with complication (HCC) Clinically stable by exam and report without s/s of hypoglycemia. DM complicated by hypertension and dyslipidemia. Tolerating medications well.  However, has been unable to get Ozempic since mid October.  A1C is good though slightly higher. Continue current medications; resume ozempic when available  - POCT glycosylated hemoglobin (Hb A1C) = 7.2  2. Essential (primary) hypertension Clinically stable exam with well controlled BP. Tolerating medications without side effects at this time. Pt to continue current regimen and low sodium diet; benefits of regular exercise as able discussed.  3. Need for immunization against influenza - Flu Vaccine QUAD High Dose(Fluad)   Partially dictated using Editor, commissioning. Any errors are unintentional.  Halina Maidens, MD [facility]  10/10/2022

## 2022-10-25 ENCOUNTER — Other Ambulatory Visit: Payer: Self-pay | Admitting: Internal Medicine

## 2022-10-25 NOTE — Telephone Encounter (Signed)
Requested Prescriptions  Pending Prescriptions Disp Refills   omega-3 acid ethyl esters (LOVAZA) 1 g capsule [Pharmacy Med Name: OMEGA-3-ACID 1GM CAPSULES (RX)] 360 capsule 2    Sig: TAKE [place] DAY     Endocrinology:  Nutritional Agents - omega-3 acid ethyl esters Failed - 10/25/2022  6:16 AM      Failed - Lipid Panel in normal range within the last 12 months    Cholesterol, Total  Date Value Ref Range Status  06/10/2022 173 100 - 199 mg/dL Final   LDL Chol Calc (NIH)  Date Value Ref Range Status  06/10/2022 86 0 - 99 mg/dL Final   HDL  Date Value Ref Range Status  06/10/2022 52 >39 mg/dL Final   Triglycerides  Date Value Ref Range Status  06/10/2022 206 (H) 0 - 149 mg/dL Final         Passed - Valid encounter within last 12 months    Recent Outpatient Visits           2 weeks ago Type II diabetes mellitus with complication ([place])   [place] [facility] at [facility], Jesse Sans, MD   4 months ago Essential (primary) hypertension   [place] [facility] at [facility], Jesse Sans, MD   9 months ago Type II diabetes mellitus with complication [facility])   [place] [facility] Care and Sports Medicine at [facility], Jesse Sans, MD   1 year ago Type II diabetes mellitus with complication [facility])   [place] [facility] at [facility], Jesse Sans, MD   1 year ago Essential (primary) hypertension   [place] [facility] Medicine at [facility], Jesse Sans, MD       Future Appointments             In 3 months Army Melia, Jesse Sans, MD [facility] Primary Care and Sports Medicine at [facility], [facility]   In 7 months Army Melia, Jesse Sans, MD [place] [facility] and Sports Medicine at [facility], [facility]

## 2022-11-06 ENCOUNTER — Telehealth: Payer: Self-pay | Admitting: Internal Medicine

## 2022-11-06 NOTE — Telephone Encounter (Signed)
Copied from [place] [phone]. Topic: Medicare AWV >> Nov 06, 2022 10:08 AM Devoria Glassing wrote: Reason for CRM: Called patient to reschedule her AWV.  Mailbox full.  khc

## 2022-11-19 ENCOUNTER — Other Ambulatory Visit: Payer: Self-pay | Admitting: Internal Medicine

## 2022-11-19 DIAGNOSIS — E1169 Type 2 diabetes mellitus with other specified complication: Secondary | ICD-10-CM

## 2022-11-24 ENCOUNTER — Telehealth: Payer: Self-pay | Admitting: Internal Medicine

## 2022-11-24 NOTE — Telephone Encounter (Signed)
Copied from [place] [phone]. Topic: Medicare AWV >> Nov 24, 2022 11:44 AM Devoria Glassing wrote: Reason for CRM: Left message tor patient to schedule Medicare Annual Wellness Visit (AWV) with [place], [place]  Appointment can be an offiice/telephone or virtual visit;  Please call [phone] ask for Juliann Pulse.

## 2022-12-02 ENCOUNTER — Other Ambulatory Visit: Payer: Self-pay | Admitting: Internal Medicine

## 2022-12-02 DIAGNOSIS — E118 Type 2 diabetes mellitus with unspecified complications: Secondary | ICD-10-CM

## 2022-12-18 ENCOUNTER — Telehealth: Payer: Self-pay | Admitting: Internal Medicine

## 2022-12-18 NOTE — Telephone Encounter (Signed)
Copied from [place] [phone]. Topic: Medicare AWV >> Dec 18, 2022  1:35 PM Devoria Glassing wrote: Reason for CRM: Called patient to schedule Medicare Annual Wellness Visit (AWV). Left message for patient to call back and schedule Medicare Annual Wellness Visit (AWV).  Last date of AWV: 10/02/2021   Please schedule an appointment at any time with Kirke Shaggy, [facility]    If any questions, please contact me.  Thank you ,  Sherol Dade; [place] Direct Dial: [phone]

## 2023-01-08 ENCOUNTER — Telehealth: Payer: Self-pay | Admitting: Internal Medicine

## 2023-01-08 NOTE — Telephone Encounter (Signed)
Contacted [name] to schedule their annual wellness visit. Appointment made for 02/12/2023.  Sherol Dade; [facility] Direct Dial: [phone]

## 2023-02-05 ENCOUNTER — Other Ambulatory Visit: Payer: Self-pay | Admitting: Internal Medicine

## 2023-02-05 NOTE — Telephone Encounter (Signed)
Requested Prescriptions  Pending Prescriptions Disp Refills   glimepiride (AMARYL) 2 MG tablet [Pharmacy Med Name: GLIMEPIRIDE 2MG  TABLETS] 180 tablet 0    Sig: TAKE 1 TABLET BY MOUTH TWICE DAILY WITH FOOD     Endocrinology:  Diabetes - Sulfonylureas Failed - 02/05/2023  6:16 AM      Failed - Cr in normal range and within 360 days    Creatinine, Ser  Date Value Ref Range Status  06/10/2022 1.03 (H) 0.57 - 1.00 mg/dL Final         Passed - HBA1C is between 0 and 7.9 and within 180 days    Hemoglobin A1C  Date Value Ref Range Status  10/10/2022 7.2 (A) 4.0 - 5.6 % Final   Hgb A1c MFr Bld  Date Value Ref Range Status  06/10/2022 6.6 (H) 4.8 - 5.6 % Final    Comment:             Prediabetes: 5.7 - 6.4          Diabetes: >6.4          Glycemic control for adults with diabetes: <7.0          Passed - Valid encounter within last 6 months    Recent Outpatient Visits           3 months ago Type II diabetes mellitus with complication [facility])   [place] [facility] at [facility], Nyoka Cowden, MD   8 months ago Essential (primary) hypertension   [place] [facility] Medicine at [facility], Nyoka Cowden, MD   1 year ago Type II diabetes mellitus with complication [facility])   [place] [facility] Care & Sports Medicine at [facility], Nyoka Cowden, MD   1 year ago Type II diabetes mellitus with complication [facility])   [place] [facility] & Sports Medicine at [facility], Nyoka Cowden, MD   1 year ago Essential (primary) hypertension   [place] Primary Care & Sports Medicine at [facility], Nyoka Cowden, MD       Future Appointments             In 4 days Judithann Graves Nyoka Cowden, MD [facility] at [facility], [facility]   In 4 months Judithann Graves, Nyoka Cowden, MD [facility] Primary Care & Sports Medicine at [facility], [facility]             metoprolol succinate (TOPROL-XL) 25 MG 24  hr tablet [Pharmacy Med Name: METOPROLOL ER SUCCINATE 25MG  TABS] 90 tablet 0    Sig: TAKE 1 TABLET BY MOUTH EVERY DAY     Cardiovascular:  Beta Blockers Passed - 02/05/2023  6:16 AM      Passed - Last BP in normal range    BP Readings from Last 1 Encounters:  10/10/22 114/64         Passed - Last Heart Rate in normal range    Pulse Readings from Last 1 Encounters:  10/10/22 64         Passed - Valid encounter within last 6 months    Recent Outpatient Visits           3 months ago Type II diabetes mellitus with complication [facility])   [place] [facility] at [facility], Nyoka Cowden, MD   8 months ago Essential (primary) hypertension    Primary [facility] & Sports Medicine at  [facility], [facility]   1 year ago Type II diabetes mellitus with complication [facility])   [place] [facility] & Sports Medicine at [facility], Nyoka Cowden, MD   1 year ago Type II diabetes mellitus with complication [facility])   [place] [facility] Care & Sports Medicine at [facility], Nyoka Cowden, MD   1 year ago Essential (primary) hypertension   [place] Primary Care & Sports Medicine at [facility], Nyoka Cowden, MD       Future Appointments             In 4 days Judithann Graves Nyoka Cowden, MD [facility] Primary Care [facility] at [facility], [facility]   In 4 months Judithann Graves, Nyoka Cowden, MD [facility] Primary Care & Sports Medicine at [facility], [facility]

## 2023-02-09 ENCOUNTER — Ambulatory Visit (INDEPENDENT_AMBULATORY_CARE_PROVIDER_SITE_OTHER): Payer: Medicare Other

## 2023-02-09 ENCOUNTER — Ambulatory Visit (INDEPENDENT_AMBULATORY_CARE_PROVIDER_SITE_OTHER): Payer: Medicare Other | Admitting: Internal Medicine

## 2023-02-09 ENCOUNTER — Encounter: Payer: Self-pay | Admitting: Internal Medicine

## 2023-02-09 VITALS — BP 128/68 | HR 64 | Ht 68.0 in | Wt 188.0 lb

## 2023-02-09 VITALS — BP 128/66 | HR 64 | Ht 68.0 in | Wt 188.0 lb

## 2023-02-09 DIAGNOSIS — E1169 Type 2 diabetes mellitus with other specified complication: Secondary | ICD-10-CM | POA: Diagnosis not present

## 2023-02-09 DIAGNOSIS — E785 Hyperlipidemia, unspecified: Secondary | ICD-10-CM | POA: Diagnosis not present

## 2023-02-09 DIAGNOSIS — E118 Type 2 diabetes mellitus with unspecified complications: Secondary | ICD-10-CM | POA: Diagnosis not present

## 2023-02-09 DIAGNOSIS — I495 Sick sinus syndrome: Secondary | ICD-10-CM

## 2023-02-09 DIAGNOSIS — Z0000 Encounter for general adult medical examination without abnormal findings: Secondary | ICD-10-CM

## 2023-02-09 NOTE — Progress Notes (Signed)
Subjective:   Kimberly Salazar is a 77 y.o. female who presents for Medicare Annual (Subsequent) preventive examination.  Review of Systems    Pt was in the office today       Objective:    Today's Vitals   02/09/23 1010  BP: 128/66  Pulse: 64  SpO2: 99%  Weight: 188 lb (85.3 kg)  Height:  (1.727 m)   Body mass index is 28.59 kg/m.     02/09/2023   10:12 AM 10/02/2021    2:49 PM 10/01/2020    2:47 PM 09/28/2019    3:34 PM 03/23/2018    8:26 AM 11/19/2015    8:36 AM  Advanced Directives  Does Patient Have a Medical Advance Directive? No No No No No No  Would patient like information on creating a medical advance directive?  No - Patient declined No - Patient declined Yes (MAU/Ambulatory/Procedural Areas - Information given) No - Patient declined No - patient declined information    Current Medications (verified) Outpatient Encounter Medications as of 02/09/2023  Medication Sig   aspirin 81 MG chewable tablet Chew 1 tablet by mouth daily.   cholecalciferol (VITAMIN D3) 25 MCG (1000 UT) tablet Take 1,000 Units by mouth daily.   glimepiride (AMARYL) 2 MG tablet TAKE 1 TABLET BY MOUTH TWICE DAILY WITH FOOD   glucose blood (ONE TOUCH ULTRA TEST) test strip 1 each by Other route 2 (two) times daily.   metFORMIN (GLUCOPHAGE-XR) 500 MG 24 hr tablet TAKE 2 TABLETS(1000 MG) BY MOUTH IN THE MORNING AND AT BEDTIME   metoprolol succinate (TOPROL-XL) 25 MG 24 hr tablet TAKE 1 TABLET BY MOUTH EVERY DAY   Multiple Vitamins-Minerals (MULTIVITAL) tablet Take 1 tablet by mouth daily.   Multiple Vitamins-Minerals (ZINC PO) Take by mouth daily.   olmesartan-hydrochlorothiazide (BENICAR HCT) 20-12.5 MG tablet TAKE 1 TABLET BY MOUTH DAILY   omega-3 acid ethyl esters (LOVAZA) 1 g capsule TAKE 4 CAPSULES BY MOUTH EVERY DAY   simvastatin (ZOCOR) 80 MG tablet TAKE 1 TABLET(80 MG) BY MOUTH DAILY   No facility-administered encounter medications on file as of 02/09/2023.    Allergies  (verified) Patient has no known allergies.   History: Past Medical History:  Diagnosis Date   Diabetes mellitus without complication    Hyperlipidemia    Hypertension    Past Surgical History:  Procedure Laterality Date   ABDOMINAL HYSTERECTOMY  1994   Uterine CA   APPENDECTOMY     CATARACT EXTRACTION Left 2011   LITHOTRIPSY  2005   PACEMAKER INSERTION  2010   Family History  Problem Relation Age of Onset   Diabetes Mother    Heart failure Father    Breast cancer Neg Hx    Social History   Socioeconomic History   Marital status: Widowed    Spouse name: Not on file   Number of children: 2   Years of education: Not on file   Highest education level: Some college, no degree  Occupational History   Not on file  Tobacco Use   Smoking status: Never   Smokeless tobacco: Never  Vaping Use   Vaping Use: Never used  Substance and Sexual Activity   Alcohol use: No    Alcohol/week: 0.0 standard drinks of alcohol   Drug use: No   Sexual activity: Not on file  Other Topics Concern   Not on file  Social History Narrative   Pt's daughter lives with her   Social Determinants of Health  Financial Resource Strain: Low Risk  (02/09/2023)   Overall Financial Resource Strain (CARDIA)    Difficulty of Paying Living Expenses: Not hard at all  Food Insecurity: No Food Insecurity (02/09/2023)   Hunger Vital Sign    Worried About Running Out of Food in the Last Year: Never true    Ran Out of Food in the Last Year: Never true  Transportation Needs: No Transportation Needs (02/09/2023)   PRAPARE - Administrator, Civil Service (Medical): No    Lack of Transportation (Non-Medical): No  Physical Activity: Inactive (10/02/2021)   Exercise Vital Sign    Days of Exercise per Week: 0 days    Minutes of Exercise per Session: 0 min  Stress: No Stress Concern Present (10/02/2021)   [employer] of Occupational Health - Occupational Stress Questionnaire    Feeling of Stress  : Not at all  Social Connections: Socially Isolated (10/02/2021)   Social Connection and Isolation Panel [NHANES]    Frequency of Communication with Friends and Family: More than three times a week    Frequency of Social Gatherings with Friends and Family: Three times a week    Attends Religious Services: Never    Active Member of Clubs or Organizations: No    Attends Banker Meetings: Never    Marital Status: Widowed    Tobacco Counseling Counseling given: Not Answered   Clinical Intake:  Pre-visit preparation completed: Yes  Pain : No/denies pain     Diabetes: Yes  How often do you need to have someone help you when you read instructions, pamphlets, or other written materials from your doctor or pharmacy?: 1 - Never  Diabetic?Yes  Interpreter Needed?: No      Activities of Daily Living     No data to display          Patient Care Team: Reubin Milan, MD as PCP - General (Internal Medicine) Adriana Reams, DO as Referring Physician (Cardiology) Etta Quill, MD as Referring Physician (Cardiology) Dellia Cloud Judeth Cornfield, MD as Referring Physician (Dermatology) [facility] (Optometry)  Indicate any recent Medical Services you may have received from other than [facility] providers in the past year (date may be approximate).     Assessment:   This is a routine wellness examination for [place].  Hearing/Vision screen Hearing Screening - Comments:: No hearing problems  Dietary issues and exercise activities discussed:     Goals Addressed   None   Depression Screen    02/09/2023   10:01 AM 10/10/2022    9:55 AM 06/10/2022   10:41 AM 01/08/2022    9:52 AM 10/02/2021    2:47 PM 09/10/2021    9:54 AM 05/06/2021    8:48 AM  PHQ 2/9 Scores  PHQ - 2 Score 0 0 0 0 0 0 0  PHQ- 9 Score 1 0 0 0  0 1    Fall Risk    02/09/2023   10:01 AM 10/10/2022    9:55 AM 06/10/2022   10:41 AM 01/08/2022    9:52 AM 10/02/2021    2:50 PM  Fall Risk    Falls in the past year? 0 0 0 0 0  Number falls in past yr: 0 0 0 0 0  Injury with Fall? 0 0 0 0 0  Risk for fall due to : No Fall Risks History of fall(s) No Fall Risks No Fall Risks No Fall Risks  Follow up Falls evaluation completed Falls evaluation  completed Falls evaluation completed Falls evaluation completed Falls prevention discussed    FALL RISK PREVENTION PERTAINING TO THE HOME:  Any stairs in or around the home? No  If so, are there any without handrails? No  Home free of loose throw rugs in walkways, pet beds, electrical cords, etc? Yes  Adequate lighting in your home to reduce risk of falls? Yes   ASSISTIVE DEVICES UTILIZED TO PREVENT FALLS:  Life alert? No  Use of a cane, walker or w/c? No  Grab bars in the bathroom? No  Shower chair or bench in shower? No  Elevated toilet seat or a handicapped toilet? No   TIMED UP AND GO:  Was the test performed? No .  Length of time to ambulate 10 feet: n/a sec.   Gait steady and fast without use of assistive device  Cognitive Function:        02/09/2023   10:12 AM 09/28/2019    3:37 PM  6CIT Screen  What Year? 0 points 0 points  What month? 0 points 0 points  What time? 0 points 0 points  Count back from 20 0 points 0 points  Months in reverse 0 points 0 points  Repeat phrase 0 points 0 points  Total Score 0 points 0 points    Immunizations Immunization History  Administered Date(s) Administered   Fluad Quad(high Dose 65+) 09/08/2019, 09/13/2020, 09/10/2021, 10/10/2022   Influenza, High Dose Seasonal PF 09/30/2018   Influenza,inj,Quad PF,6+ Mos 11/19/2015, 07/15/2016   Influenza-Unspecified 06/27/2017   Moderna Sars-Covid-2 Vaccination 12/22/2019, 01/19/2020   Pneumococcal Conjugate-13 09/15/2014   Pneumococcal Polysaccharide-23 06/02/2011   Tdap 06/02/2011   Zoster, Live 10/27/2006    TDAP status: Due, Education has been provided regarding the importance of this vaccine. Advised may receive this vaccine  at local pharmacy or Health Dept. Aware to provide a copy of the vaccination record if obtained from local pharmacy or Health Dept. Verbalized acceptance and understanding.  Flu Vaccine status: Up to date  Pneumococcal vaccine status: Up to date  Covid-19 vaccine status: Completed vaccines  Qualifies for Shingles Vaccine? Yes   Zostavax completed No   Shingrix Completed?: No.    Education has been provided regarding the importance of this vaccine. Patient has been advised to call insurance company to determine out of pocket expense if they have not yet received this vaccine. Advised may also receive vaccine at local pharmacy or Health Dept. Verbalized acceptance and understanding.  Screening Tests Health Maintenance  Topic Date Due   Zoster Vaccines- Shingrix (1 of 2) Never done   DTaP/Tdap/Td (2 - Td or Tdap) 06/01/2021   OPHTHALMOLOGY EXAM  01/22/2022   HEMOGLOBIN A1C  04/11/2023   COLONOSCOPY (Pts 45-54yrs Insurance coverage will need to be confirmed)  05/26/2023   INFLUENZA VACCINE  05/28/2023   Diabetic kidney evaluation - eGFR measurement  06/11/2023   Diabetic kidney evaluation - Urine ACR  06/11/2023   FOOT EXAM  06/11/2023   MAMMOGRAM  09/12/2023   Medicare Annual Wellness (AWV)  02/09/2024   Pneumonia Vaccine 75+ Years old  Completed   DEXA SCAN  Completed   Hepatitis C Screening  Completed   HPV VACCINES  Aged Out   COVID-19 Vaccine  Discontinued    Health Maintenance  Health Maintenance Due  Topic Date Due   Zoster Vaccines- Shingrix (1 of 2) Never done   DTaP/Tdap/Td (2 - Td or Tdap) 06/01/2021   OPHTHALMOLOGY EXAM  01/22/2022    Colorectal cancer screening: Type of  screening: Colonoscopy. Completed 05/25/2018. Repeat every 5 years  Mammogram status: Completed 09/11/2022. Repeat every year  Bone Density status: Completed 05/15/2021. Results reflect: Bone density results: NORMAL. Repeat every 2-3 years.  Lung Cancer Screening: (Low Dose CT Chest recommended  if Age 56-80 years, 30 pack-year currently smoking OR have quit w/in 15years.) does not qualify.   Lung Cancer Screening Referral: no  Additional Screening:  Hepatitis C Screening: does qualify; Completed 11/19/2015  Vision Screening: Recommended annual ophthalmology exams for early detection of glaucoma and other disorders of the eye. Is the patient up to date with their annual eye exam?  Yes  Who is the provider or what is the name of the office in which the patient attends annual eye exams? [facility] If pt is not established with a provider, would they like to be referred to a provider to establish care? No .   Dental Screening: Recommended annual dental exams for proper oral hygiene  Community Resource Referral / Chronic Care Management: CRR required this visit?  No   CCM required this visit?  No      Plan:     I have personally reviewed and noted the following in the patient's chart:   Medical and social history Use of alcohol, tobacco or illicit drugs  Current medications and supplements including opioid prescriptions. Patient is not currently taking opioid prescriptions. Functional ability and status Nutritional status Physical activity Advanced directives List of other physicians Hospitalizations, surgeries, and ER visits in previous 12 months Vitals Screenings to include cognitive, depression, and falls Referrals and appointments  In addition, I have reviewed and discussed with patient certain preventive protocols, quality metrics, and best practice recommendations. A written personalized care plan for preventive services as well as general preventive health recommendations were provided to patient.     Eulis Canner Artesha Wemhoff, CMA   02/09/2023   Nurse Notes: none

## 2023-02-09 NOTE — Assessment & Plan Note (Addendum)
Clinically stable without s/s of hypoglycemia.  Not checking FSBS at home. Tolerating glipizide and metformin well without side effects or other concerns. Has been off Ozempic since October and would consider restarting if needed. Lab Results  Component Value Date   HGBA1C 7.2 (A) 10/10/2022

## 2023-02-09 NOTE — Assessment & Plan Note (Signed)
Tolerating statin medications without concerns LDL is  Lab Results  Component Value Date   LDLCALC 86 06/10/2022   with a goal of < 70. Current dose will be adjusted if needed.

## 2023-02-09 NOTE — Assessment & Plan Note (Signed)
Followed closely by Cardiology s/p pacemaker Doing well without chest pain or shortness of breath

## 2023-02-09 NOTE — Progress Notes (Signed)
Date:  02/09/2023   Name:  Kimberly Salazar   DOB:  [birth year]   MRN:  [id number]   Chief Complaint: Hypertension and Diabetes  Diabetes She presents for her follow-up diabetic visit. She has type 2 diabetes mellitus. Her disease course has been stable. Pertinent negatives for hypoglycemia include no headaches or tremors. Pertinent negatives for diabetes include no chest pain, no fatigue, no polydipsia and no polyuria. Current diabetic treatment includes oral agent (dual therapy) (glimepiride and metformin/ no Ozempic since October). She is compliant with treatment all of the time. An ACE inhibitor/angiotensin II receptor blocker is being taken. Eye exam is not current.  Hypertension This is a chronic problem. The problem is controlled. Pertinent negatives include no chest pain, headaches, palpitations or shortness of breath.  Hyperlipidemia This is a chronic problem. The problem is uncontrolled. Recent lipid tests were reviewed and are high. Pertinent negatives include no chest pain or shortness of breath. Current antihyperlipidemic treatment includes statins. The current treatment provides moderate improvement of lipids.    Lab Results  Component Value Date   NA 140 06/10/2022   K 5.0 06/10/2022   CO2 19 (L) 06/10/2022   GLUCOSE 214 (H) 06/10/2022   BUN 22 06/10/2022   CREATININE 1.03 (H) 06/10/2022   CALCIUM 10.4 (H) 06/10/2022   EGFR 56 (L) 06/10/2022   GFRNONAA 57 (L) 09/13/2020   Lab Results  Component Value Date   CHOL 173 06/10/2022   HDL 52 06/10/2022   LDLCALC 86 06/10/2022   TRIG 206 (H) 06/10/2022   CHOLHDL 3.3 06/10/2022   Lab Results  Component Value Date   TSH 3.240 06/10/2022   Lab Results  Component Value Date   HGBA1C 7.2 (A) 10/10/2022   Lab Results  Component Value Date   WBC 7.3 06/10/2022   HGB 14.0 06/10/2022   HCT 43.0 06/10/2022   MCV 92 06/10/2022   PLT 201 06/10/2022   Lab Results  Component Value Date   ALT 22 06/10/2022   AST 26  06/10/2022   ALKPHOS 54 06/10/2022   BILITOT 0.6 06/10/2022   No results found for: "25OHVITD2", "25OHVITD3", "VD25OH"   Review of Systems  Constitutional:  Negative for appetite change, fatigue, fever and unexpected weight change.  HENT:  Negative for tinnitus and trouble swallowing.   Eyes:  Positive for visual disturbance (film over left eye - seeing specialist).  Respiratory:  Negative for cough, chest tightness and shortness of breath.   Cardiovascular:  Negative for chest pain, palpitations and leg swelling.  Gastrointestinal:  Negative for abdominal pain.  Endocrine: Negative for polydipsia and polyuria.  Genitourinary:  Negative for dysuria and hematuria.  Musculoskeletal:  Negative for arthralgias.  Neurological:  Negative for tremors, numbness and headaches.  Psychiatric/Behavioral:  Negative for dysphoric mood.     Patient Active Problem List   Diagnosis Date Noted   Sick sinus syndrome 02/09/2023   Ovarian failure 04/09/2017   Basal cell carcinoma, leg 07/15/2016   Abnormal finding on thallium stress test 05/17/2015   Hyperlipidemia associated with type 2 diabetes mellitus (HCC) 04/12/2015   Essential (primary) hypertension 04/12/2015   History of uterine cancer 04/12/2015   Type II diabetes mellitus with complication 04/12/2015   Cardiac pacemaker in situ 10/11/2009    No Known Allergies  Past Surgical History:  Procedure Laterality Date   ABDOMINAL HYSTERECTOMY  1994   Uterine CA   APPENDECTOMY     CATARACT EXTRACTION Left 2011   LITHOTRIPSY  2005  PACEMAKER INSERTION  2010    Social History   Tobacco Use   Smoking status: Never   Smokeless tobacco: Never  Vaping Use   Vaping Use: Never used  Substance Use Topics   Alcohol use: No    Alcohol/week: 0.0 standard drinks of alcohol   Drug use: No     Medication list has been reviewed and updated.  Current Meds  Medication Sig   aspirin 81 MG chewable tablet Chew 1 tablet by mouth daily.    cholecalciferol (VITAMIN D3) 25 MCG (1000 UT) tablet Take 1,000 Units by mouth daily.   glimepiride (AMARYL) 2 MG tablet TAKE 1 TABLET BY MOUTH TWICE DAILY WITH FOOD   glucose blood (ONE TOUCH ULTRA TEST) test strip 1 each by Other route 2 (two) times daily.   metFORMIN (GLUCOPHAGE-XR) 500 MG 24 hr tablet TAKE 2 TABLETS(1000 MG) BY MOUTH IN THE MORNING AND AT BEDTIME   metoprolol succinate (TOPROL-XL) 25 MG 24 hr tablet TAKE 1 TABLET BY MOUTH EVERY DAY   Multiple Vitamins-Minerals (MULTIVITAL) tablet Take 1 tablet by mouth daily.   Multiple Vitamins-Minerals (ZINC PO) Take by mouth daily.   olmesartan-hydrochlorothiazide (BENICAR HCT) 20-12.5 MG tablet TAKE 1 TABLET BY MOUTH DAILY   omega-3 acid ethyl esters (LOVAZA) 1 g capsule TAKE 4 CAPSULES BY MOUTH EVERY DAY   simvastatin (ZOCOR) 80 MG tablet TAKE 1 TABLET(80 MG) BY MOUTH DAILY       02/09/2023   10:01 AM 10/10/2022    9:55 AM 06/10/2022   10:41 AM 01/08/2022    9:52 AM  GAD 7 : Generalized Anxiety Score  Nervous, Anxious, on Edge 0 0 0 0  Control/stop worrying 0 0 0 0  Worry too much - different things 0 0 0 0  Trouble relaxing 0 0 0 0  Restless 0 0 0 0  Easily annoyed or irritable 0 0 0 0  Afraid - awful might happen 0 0 0 0  Total GAD 7 Score 0 0 0 0  Anxiety Difficulty Not difficult at all Not difficult at all Not difficult at all Not difficult at all       02/09/2023   10:01 AM 10/10/2022    9:55 AM 06/10/2022   10:41 AM  Depression screen PHQ 2/9  Decreased Interest 0 0 0  Down, Depressed, Hopeless 0 0 0  PHQ - 2 Score 0 0 0  Altered sleeping 0 0 0  Tired, decreased energy 1 0 0  Change in appetite 0 0 0  Feeling bad or failure about yourself  0 0 0  Trouble concentrating 0 0 0  Moving slowly or fidgety/restless 0 0 0  Suicidal thoughts 0 0 0  PHQ-9 Score 1 0 0  Difficult doing work/chores Not difficult at all Not difficult at all Not difficult at all    BP Readings from Last 3 Encounters:  02/09/23 128/68   10/10/22 114/64  06/10/22 124/62    Physical Exam Vitals and nursing note reviewed.  Constitutional:      General: She is not in acute distress.    Appearance: Normal appearance. She is well-developed.  HENT:     Head: Normocephalic and atraumatic.  Neck:     Vascular: No carotid bruit.  Cardiovascular:     Rate and Rhythm: Normal rate and regular rhythm.  Pulmonary:     Effort: Pulmonary effort is normal. No respiratory distress.     Breath sounds: No wheezing or rhonchi.  Musculoskeletal:  Cervical back: Normal range of motion.     Right lower leg: No edema.     Left lower leg: No edema.  Feet:     Comments: Callous on left second toe Lymphadenopathy:     Cervical: No cervical adenopathy.  Skin:    General: Skin is warm and dry.     Capillary Refill: Capillary refill takes less than 2 seconds.     Findings: No rash.  Neurological:     Mental Status: She is alert and oriented to person, place, and time.  Psychiatric:        Mood and Affect: Mood normal.        Behavior: Behavior normal.     Wt Readings from Last 3 Encounters:  02/09/23 188 lb (85.3 kg)  10/10/22 185 lb (83.9 kg)  06/10/22 179 lb (81.2 kg)    BP 128/68   Pulse 64   Ht  (1.727 m)   Wt 188 lb (85.3 kg)   SpO2 97%   BMI 28.59 kg/m   Assessment and Plan:  Problem List Items Addressed This Visit       Cardiovascular and Mediastinum   Sick sinus syndrome    Followed closely by Cardiology s/p pacemaker Doing well without chest pain or shortness of breath          Endocrine   Hyperlipidemia associated with type 2 diabetes mellitus (HCC) (Chronic)    Tolerating statin medications without concerns LDL is  Lab Results  Component Value Date   LDLCALC 86 06/10/2022  with a goal of < 70. Current dose will be adjusted if needed.       Type II diabetes mellitus with complication - Primary (Chronic)    Clinically stable without s/s of hypoglycemia.  Not checking FSBS at  home. Tolerating glipizide and metformin well without side effects or other concerns. Has been off Ozempic since October and would consider restarting if needed. Lab Results  Component Value Date   HGBA1C 7.2 (A) 10/10/2022        Relevant Orders   Comprehensive metabolic panel   Hemoglobin A1c    Return in about 4 months (around 06/11/2023).   MAW visit completed today by CMA.  Partially dictated using Dragon software, any errors are not intentional.  Reubin Milan, MD [facility] [place], [place]

## 2023-02-10 ENCOUNTER — Other Ambulatory Visit: Payer: Self-pay | Admitting: Internal Medicine

## 2023-02-10 DIAGNOSIS — E118 Type 2 diabetes mellitus with unspecified complications: Secondary | ICD-10-CM

## 2023-02-10 LAB — COMPREHENSIVE METABOLIC PANEL
ALT: 24 IU/L (ref 0–32)
AST: 27 IU/L (ref 0–40)
Albumin/Globulin Ratio: 2 (ref 1.2–2.2)
Albumin: 4.6 g/dL (ref 3.8–4.8)
Alkaline Phosphatase: 60 IU/L (ref 44–121)
BUN/Creatinine Ratio: 29 — ABNORMAL HIGH (ref 12–28)
BUN: 30 mg/dL — ABNORMAL HIGH (ref 8–27)
Bilirubin Total: 0.6 mg/dL (ref 0.0–1.2)
CO2: 19 mmol/L — ABNORMAL LOW (ref 20–29)
Calcium: 10.1 mg/dL (ref 8.7–10.3)
Chloride: 103 mmol/L (ref 96–106)
Creatinine, Ser: 1.02 mg/dL — ABNORMAL HIGH (ref 0.57–1.00)
Globulin, Total: 2.3 g/dL (ref 1.5–4.5)
Glucose: 226 mg/dL — ABNORMAL HIGH (ref 70–99)
Potassium: 4.9 mmol/L (ref 3.5–5.2)
Sodium: 140 mmol/L (ref 134–144)
Total Protein: 6.9 g/dL (ref 6.0–8.5)
eGFR: 57 mL/min/{1.73_m2} — ABNORMAL LOW (ref >59)

## 2023-02-10 LAB — HEMOGLOBIN A1C
Est. average glucose Bld gHb Est-mCnc: 183 mg/dL
Hgb A1c MFr Bld: 8 % — ABNORMAL HIGH (ref 4.8–5.6)

## 2023-02-10 MED ORDER — OZEMPIC (0.25 OR 0.5 MG/DOSE) 2 MG/3ML ~~LOC~~ SOPN: 0.5000 mg | PEN_INJECTOR | SUBCUTANEOUS | 0 refills | Status: DC

## 2023-03-24 ENCOUNTER — Other Ambulatory Visit: Payer: Self-pay | Admitting: Internal Medicine

## 2023-03-24 DIAGNOSIS — I10 Essential (primary) hypertension: Secondary | ICD-10-CM

## 2023-03-31 ENCOUNTER — Other Ambulatory Visit: Payer: Self-pay | Admitting: Internal Medicine

## 2023-03-31 DIAGNOSIS — E118 Type 2 diabetes mellitus with unspecified complications: Secondary | ICD-10-CM

## 2023-03-31 MED ORDER — SEMAGLUTIDE (1 MG/DOSE) 4 MG/3ML ~~LOC~~ SOPN
1.0000 mg | PEN_INJECTOR | SUBCUTANEOUS | 2 refills | Status: DC
Start: 2023-03-31 — End: 2023-10-12

## 2023-04-27 HISTORY — PX: OTHER SURGICAL HISTORY: SHX169

## 2023-05-07 ENCOUNTER — Other Ambulatory Visit: Payer: Self-pay | Admitting: Internal Medicine

## 2023-05-07 DIAGNOSIS — E1169 Type 2 diabetes mellitus with other specified complication: Secondary | ICD-10-CM

## 2023-06-11 ENCOUNTER — Encounter: Payer: Self-pay | Admitting: Internal Medicine

## 2023-06-11 ENCOUNTER — Ambulatory Visit: Payer: Medicare Other | Admitting: Internal Medicine

## 2023-06-11 VITALS — BP 104/62 | HR 62 | Ht 68.0 in | Wt 171.8 lb

## 2023-06-11 DIAGNOSIS — Z23 Encounter for immunization: Secondary | ICD-10-CM

## 2023-06-11 DIAGNOSIS — I10 Essential (primary) hypertension: Secondary | ICD-10-CM

## 2023-06-11 DIAGNOSIS — E785 Hyperlipidemia, unspecified: Secondary | ICD-10-CM

## 2023-06-11 DIAGNOSIS — Z1231 Encounter for screening mammogram for malignant neoplasm of breast: Secondary | ICD-10-CM

## 2023-06-11 DIAGNOSIS — E1169 Type 2 diabetes mellitus with other specified complication: Secondary | ICD-10-CM | POA: Diagnosis not present

## 2023-06-11 DIAGNOSIS — Z1211 Encounter for screening for malignant neoplasm of colon: Secondary | ICD-10-CM

## 2023-06-11 DIAGNOSIS — Z7985 Long-term (current) use of injectable non-insulin antidiabetic drugs: Secondary | ICD-10-CM

## 2023-06-11 DIAGNOSIS — E118 Type 2 diabetes mellitus with unspecified complications: Secondary | ICD-10-CM

## 2023-06-11 NOTE — Assessment & Plan Note (Signed)
Normal exam with stable BP on Benicar hct and metoprolol. No concerns or side effects to current medication. No change in regimen; continue low sodium diet.

## 2023-06-11 NOTE — Assessment & Plan Note (Addendum)
Blood sugars stable without hypoglycemic symptoms or events. Current regimen is metformin, glipizide and ozempic. Changes made last visit are resuming ozempic 1 mg however dose it too high - has lost 17 lbs and feels weak due to loss of appetite. Will cut the dose back to ~ 0.5 mg weekly Lab Results  Component Value Date   HGBA1C 8.0 (H) 02/09/2023

## 2023-06-11 NOTE — Progress Notes (Signed)
Date:  06/11/2023   Name:  Kimberly Salazar   DOB:  12-16-1945   MRN:  [id number]   Chief Complaint: Annual Exam Kimberly Salazar is a 77 y.o. female who presents today for her Complete Annual Exam. She feels well. She reports exercising / walking. She reports she is sleeping well. Breast complaints  -none.  Mammogram: 08/2022 DEXA: 04/2021 normal Colonoscopy: 04/2018 repeat 5 yrs  Health Maintenance Due  Topic Date Due   Zoster Vaccines- Shingrix (1 of 2) 05/17/1965   DTaP/Tdap/Td (2 - Td or Tdap) 06/01/2021   OPHTHALMOLOGY EXAM  01/22/2022   Colonoscopy  05/26/2023   Diabetic kidney evaluation - Urine ACR  06/11/2023   INFLUENZA VACCINE  05/28/2023    Immunization History  Administered Date(s) Administered   Fluad Quad(high Dose 65+) 09/08/2019, 09/13/2020, 09/10/2021, 10/10/2022   Influenza, High Dose Seasonal PF 09/30/2018   Influenza,inj,Quad PF,6+ Mos 11/19/2015, 07/15/2016   Influenza-Unspecified 06/27/2017   Moderna Sars-Covid-2 Vaccination 12/22/2019, 01/19/2020   PNEUMOCOCCAL CONJUGATE-20 06/11/2023   Pneumococcal Conjugate-13 09/15/2014   Pneumococcal Polysaccharide-23 06/02/2011   Tdap 06/02/2011   Zoster, Live 10/27/2006    Hypertension Pertinent negatives include no chest pain, headaches, palpitations or shortness of breath.  Diabetes Pertinent negatives for hypoglycemia include no dizziness, headaches, nervousness/anxiousness or tremors. Pertinent negatives for diabetes include no chest pain, no fatigue, no polydipsia and no polyuria.  Hyperlipidemia Pertinent negatives include no chest pain or shortness of breath.    Lab Results  Component Value Date   NA 140 02/09/2023   K 4.9 02/09/2023   CO2 19 (L) 02/09/2023   GLUCOSE 226 (H) 02/09/2023   BUN 30 (H) 02/09/2023   CREATININE 1.02 (H) 02/09/2023   CALCIUM 10.1 02/09/2023   EGFR 57 (L) 02/09/2023   GFRNONAA 57 (L) 09/13/2020   Lab Results  Component Value Date   CHOL 173 06/10/2022   HDL 52  06/10/2022   LDLCALC 86 06/10/2022   TRIG 206 (H) 06/10/2022   CHOLHDL 3.3 06/10/2022   Lab Results  Component Value Date   TSH 3.240 06/10/2022   Lab Results  Component Value Date   HGBA1C 8.0 (H) 02/09/2023   Lab Results  Component Value Date   WBC 7.3 06/10/2022   HGB 14.0 06/10/2022   HCT 43.0 06/10/2022   MCV 92 06/10/2022   PLT 201 06/10/2022   Lab Results  Component Value Date   ALT 24 02/09/2023   AST 27 02/09/2023   ALKPHOS 60 02/09/2023   BILITOT 0.6 02/09/2023   No results found for: "25OHVITD2", "25OHVITD3", "VD25OH"   Review of Systems  Constitutional:  Negative for chills, fatigue and fever.  HENT:  Negative for congestion, hearing loss, tinnitus, trouble swallowing and voice change.   Eyes:  Negative for visual disturbance.  Respiratory:  Negative for cough, chest tightness, shortness of breath and wheezing.   Cardiovascular:  Negative for chest pain, palpitations and leg swelling.  Gastrointestinal:  Negative for abdominal pain, constipation, diarrhea and vomiting.  Endocrine: Negative for polydipsia and polyuria.  Genitourinary:  Negative for dysuria, frequency, genital sores, vaginal bleeding and vaginal discharge.  Musculoskeletal:  Negative for arthralgias, gait problem and joint swelling.  Skin:  Negative for color change and rash.  Neurological:  Negative for dizziness, tremors, light-headedness and headaches.  Hematological:  Negative for adenopathy. Bruises/bleeds easily.  Psychiatric/Behavioral:  Negative for dysphoric mood and sleep disturbance. The patient is not nervous/anxious.     Patient Active Problem List   Diagnosis Date Noted  Sick sinus syndrome (HCC) 02/09/2023   Ovarian failure 04/09/2017   Basal cell carcinoma, leg 07/15/2016   Abnormal finding on thallium stress test 05/17/2015   Hyperlipidemia associated with type 2 diabetes mellitus (HCC) 04/12/2015   Essential (primary) hypertension 04/12/2015   History of uterine  cancer 04/12/2015   Type II diabetes mellitus with complication (HCC) 04/12/2015   Cardiac pacemaker in situ 10/11/2009    No Known Allergies  Past Surgical History:  Procedure Laterality Date   ABDOMINAL HYSTERECTOMY  1994   Uterine CA   APPENDECTOMY     CATARACT EXTRACTION Left 2011   LITHOTRIPSY  2005   PACEMAKER INSERTION  2010    Social History   Tobacco Use   Smoking status: Never   Smokeless tobacco: Never  Vaping Use   Vaping status: Never Used  Substance Use Topics   Alcohol use: No    Alcohol/week: 0.0 standard drinks of alcohol   Drug use: No     Medication list has been reviewed and updated.  Current Meds  Medication Sig   aspirin 81 MG chewable tablet Chew 1 tablet by mouth daily.   cholecalciferol (VITAMIN D3) 25 MCG (1000 UT) tablet Take 1,000 Units by mouth daily.   glimepiride (AMARYL) 2 MG tablet TAKE 1 TABLET BY MOUTH TWICE DAILY WITH FOOD   glucose blood (ONE TOUCH ULTRA TEST) test strip 1 each by Other route 2 (two) times daily.   metFORMIN (GLUCOPHAGE-XR) 500 MG 24 hr tablet TAKE 2 TABLETS(1000 MG) BY MOUTH IN THE MORNING AND AT BEDTIME   metoprolol succinate (TOPROL-XL) 25 MG 24 hr tablet TAKE 1 TABLET BY MOUTH EVERY DAY   Multiple Vitamins-Minerals (MULTIVITAL) tablet Take 1 tablet by mouth daily.   Multiple Vitamins-Minerals (ZINC PO) Take by mouth daily.   olmesartan-hydrochlorothiazide (BENICAR HCT) 20-12.5 MG tablet TAKE 1 TABLET BY MOUTH DAILY   omega-3 acid ethyl esters (LOVAZA) 1 g capsule TAKE 4 CAPSULES BY MOUTH EVERY DAY   Semaglutide, 1 MG/DOSE, 4 MG/3ML SOPN Inject 1 mg into the skin once a week.   simvastatin (ZOCOR) 80 MG tablet TAKE 1 TABLET(80 MG) BY MOUTH DAILY       06/11/2023    9:31 AM 02/09/2023   10:01 AM 10/10/2022    9:55 AM 06/10/2022   10:41 AM  GAD 7 : Generalized Anxiety Score  Nervous, Anxious, on Edge 0 0 0 0  Control/stop worrying 0 0 0 0  Worry too much - different things 0 0 0 0  Trouble relaxing 0 0 0 0   Restless 0 0 0 0  Easily annoyed or irritable 0 0 0 0  Afraid - awful might happen 0 0 0 0  Total GAD 7 Score 0 0 0 0  Anxiety Difficulty Not difficult at all Not difficult at all Not difficult at all Not difficult at all       06/11/2023    9:31 AM 02/09/2023   10:01 AM 10/10/2022    9:55 AM  Depression screen PHQ 2/9  Decreased Interest 0 0 0  Down, Depressed, Hopeless 0 0 0  PHQ - 2 Score 0 0 0  Altered sleeping 0 0 0  Tired, decreased energy 0 1 0  Change in appetite 0 0 0  Feeling bad or failure about yourself  0 0 0  Trouble concentrating 0 0 0  Moving slowly or fidgety/restless 0 0 0  Suicidal thoughts 0 0 0  PHQ-9 Score 0 1 0  Difficult doing  work/chores Not difficult at all Not difficult at all Not difficult at all    BP Readings from Last 3 Encounters:  06/11/23 104/62  02/09/23 128/66  02/09/23 128/68    Physical Exam Vitals and nursing note reviewed.  Constitutional:      General: She is not in acute distress.    Appearance: She is well-developed.  HENT:     Head: Normocephalic and atraumatic.     Right Ear: Tympanic membrane and ear canal normal.     Left Ear: Tympanic membrane and ear canal normal.     Nose:     Right Sinus: No maxillary sinus tenderness.     Left Sinus: No maxillary sinus tenderness.  Eyes:     General: No scleral icterus.       Right eye: No discharge.        Left eye: No discharge.     Conjunctiva/sclera: Conjunctivae normal.  Neck:     Thyroid: No thyromegaly.     Vascular: No carotid bruit.  Cardiovascular:     Rate and Rhythm: Normal rate and regular rhythm.     Pulses: Normal pulses.     Heart sounds: Normal heart sounds.  Pulmonary:     Effort: Pulmonary effort is normal. No respiratory distress.     Breath sounds: No wheezing.  Chest:  Breasts:    Right: No mass, nipple discharge, skin change or tenderness.     Left: No mass, nipple discharge, skin change or tenderness.  Abdominal:     General: Bowel sounds are  normal.     Palpations: Abdomen is soft.     Tenderness: There is no abdominal tenderness.  Musculoskeletal:     Cervical back: Normal range of motion. No erythema.     Right lower leg: No edema.     Left lower leg: No edema.  Lymphadenopathy:     Cervical: No cervical adenopathy.  Skin:    General: Skin is warm and dry.     Findings: No rash.  Neurological:     Mental Status: She is alert and oriented to person, place, and time.     Cranial Nerves: No cranial nerve deficit.     Sensory: No sensory deficit.     Deep Tendon Reflexes: Reflexes are normal and symmetric.  Psychiatric:        Attention and Perception: Attention normal.        Mood and Affect: Mood normal.        Behavior: Behavior normal.    Diabetic Foot Exam - Simple   Simple Foot Form Diabetic Foot exam was performed with the following findings: Yes 06/11/2023 10:11 AM  Visual Inspection No deformities, no ulcerations, no other skin breakdown bilaterally: Yes Sensation Testing Intact to touch and monofilament testing bilaterally: Yes Pulse Check Posterior Tibialis and Dorsalis pulse intact bilaterally: Yes Comments      Wt Readings from Last 3 Encounters:  06/11/23 171 lb 12.8 oz (77.9 kg)  02/09/23 188 lb (85.3 kg)  02/09/23 188 lb (85.3 kg)    BP 104/62   Pulse 62   Ht 5\' 8"  (1.727 m)   Wt 171 lb 12.8 oz (77.9 kg)   SpO2 97%   BMI 26.12 kg/m   Assessment and Plan:  Problem List Items Addressed This Visit       Unprioritized   Type II diabetes mellitus with complication (HCC) - Primary (Chronic)    Blood sugars stable without hypoglycemic symptoms or events. Current regimen  is metformin, glipizide and ozempic. Changes made last visit are resuming ozempic 1 mg however dose it too high - has lost 17 lbs and feels weak due to loss of appetite. Will cut the dose back to ~ 0.5 mg weekly Lab Results  Component Value Date   HGBA1C 8.0 (H) 02/09/2023         Relevant Orders   Comprehensive  metabolic panel   Hemoglobin A1c   Microalbumin / creatinine urine ratio   Hyperlipidemia associated with type 2 diabetes mellitus (HCC) (Chronic)    LDL is  Lab Results  Component Value Date   LDLCALC 86 06/10/2022   Currently being treated with simvastatin and lovaza with good compliance and no concerns.       Relevant Orders   Lipid panel   Essential (primary) hypertension (Chronic)    Normal exam with stable BP on Benicar hct and metoprolol. No concerns or side effects to current medication. No change in regimen; continue low sodium diet.       Relevant Orders   CBC with Differential/Platelet   TSH   Other Visit Diagnoses     Encounter for screening mammogram for breast cancer       Relevant Orders   MM 3D SCREENING MAMMOGRAM BILATERAL BREAST   Colon cancer screening       Relevant Orders   Ambulatory referral to Gastroenterology   Need for vaccination for pneumococcus       Relevant Orders   Pneumococcal conjugate vaccine 20-valent (Completed)       Return in about 4 months (around 10/11/2023) for DM, HTN.    Reubin Milan, MD [facility] [place]

## 2023-06-11 NOTE — Patient Instructions (Addendum)
Call [facility] to schedule your mammogram at [phone].  Cut the Ozempic dose in half weekly.  Let me know if your symptoms do not improve.

## 2023-06-11 NOTE — Assessment & Plan Note (Signed)
LDL is  Lab Results  Component Value Date   LDLCALC 86 06/10/2022   Currently being treated with simvastatin and lovaza with good compliance and no concerns.

## 2023-06-12 ENCOUNTER — Other Ambulatory Visit: Payer: Self-pay | Admitting: Internal Medicine

## 2023-06-12 DIAGNOSIS — E118 Type 2 diabetes mellitus with unspecified complications: Secondary | ICD-10-CM

## 2023-06-12 LAB — CBC WITH DIFFERENTIAL/PLATELET
Basophils Absolute: 0 10*3/uL (ref 0.0–0.2)
Basos: 1 %
EOS (ABSOLUTE): 0.1 10*3/uL (ref 0.0–0.4)
Eos: 1 %
Hematocrit: 40.5 % (ref 34.0–46.6)
Hemoglobin: 13.7 g/dL (ref 11.1–15.9)
Immature Grans (Abs): 0 10*3/uL (ref 0.0–0.1)
Immature Granulocytes: 0 %
Lymphocytes Absolute: 1.7 10*3/uL (ref 0.7–3.1)
Lymphs: 23 %
MCH: 30.9 pg (ref 26.6–33.0)
MCHC: 33.8 g/dL (ref 31.5–35.7)
MCV: 91 fL (ref 79–97)
Monocytes Absolute: 0.4 10*3/uL (ref 0.1–0.9)
Monocytes: 5 %
Neutrophils Absolute: 5.2 10*3/uL (ref 1.4–7.0)
Neutrophils: 70 %
Platelets: 181 10*3/uL (ref 150–450)
RBC: 4.43 x10E6/uL (ref 3.77–5.28)
RDW: 12.5 % (ref 11.7–15.4)
WBC: 7.4 10*3/uL (ref 3.4–10.8)

## 2023-06-12 LAB — COMPREHENSIVE METABOLIC PANEL
ALT: 25 IU/L (ref 0–32)
AST: 25 IU/L (ref 0–40)
Albumin: 4.7 g/dL (ref 3.8–4.8)
Alkaline Phosphatase: 54 IU/L (ref 44–121)
BUN/Creatinine Ratio: 15 (ref 12–28)
BUN: 16 mg/dL (ref 8–27)
Bilirubin Total: 0.6 mg/dL (ref 0.0–1.2)
CO2: 22 mmol/L (ref 20–29)
Calcium: 10.5 mg/dL — ABNORMAL HIGH (ref 8.7–10.3)
Chloride: 101 mmol/L (ref 96–106)
Creatinine, Ser: 1.08 mg/dL — ABNORMAL HIGH (ref 0.57–1.00)
Globulin, Total: 2.2 g/dL (ref 1.5–4.5)
Glucose: 192 mg/dL — ABNORMAL HIGH (ref 70–99)
Potassium: 4.4 mmol/L (ref 3.5–5.2)
Sodium: 141 mmol/L (ref 134–144)
Total Protein: 6.9 g/dL (ref 6.0–8.5)
eGFR: 53 mL/min/{1.73_m2} — ABNORMAL LOW (ref >59)

## 2023-06-12 LAB — LIPID PANEL
Chol/HDL Ratio: 2.4 ratio (ref 0.0–4.4)
Cholesterol, Total: 123 mg/dL (ref 100–199)
HDL: 51 mg/dL (ref >39)
LDL Chol Calc (NIH): 44 mg/dL (ref 0–99)
Triglycerides: 171 mg/dL — ABNORMAL HIGH (ref 0–149)
VLDL Cholesterol Cal: 28 mg/dL (ref 5–40)

## 2023-06-12 LAB — HEMOGLOBIN A1C
Est. average glucose Bld gHb Est-mCnc: 120 mg/dL
Hgb A1c MFr Bld: 5.8 % — ABNORMAL HIGH (ref 4.8–5.6)

## 2023-06-12 LAB — MICROALBUMIN / CREATININE URINE RATIO
Creatinine, Urine: 184.3 mg/dL
Microalb/Creat Ratio: 27 mg/g{creat} (ref 0–29)
Microalbumin, Urine: 48.9 ug/mL

## 2023-06-12 LAB — TSH: TSH: 2.24 u[IU]/mL (ref 0.450–4.500)

## 2023-08-05 ENCOUNTER — Other Ambulatory Visit: Payer: Self-pay | Admitting: Internal Medicine

## 2023-08-05 DIAGNOSIS — I10 Essential (primary) hypertension: Secondary | ICD-10-CM

## 2023-08-05 NOTE — Telephone Encounter (Signed)
Requested Prescriptions  Pending Prescriptions Disp Refills   olmesartan-hydrochlorothiazide (BENICAR HCT) 20-12.5 MG tablet [Pharmacy Med Name: OLMESARTAN MEDOX/HCTZ 20-12.5MG  TAB] 90 tablet 0    Sig: TAKE 1 TABLET BY MOUTH DAILY     Cardiovascular: ARB + Diuretic Combos Failed - 08/05/2023  6:15 AM      Failed - Cr in normal range and within 180 days    Creatinine, Ser  Date Value Ref Range Status  06/11/2023 1.08 (H) 0.57 - 1.00 mg/dL Final         Passed - K in normal range and within 180 days    Potassium  Date Value Ref Range Status  06/11/2023 4.4 3.5 - 5.2 mmol/L Final         Passed - Na in normal range and within 180 days    Sodium  Date Value Ref Range Status  06/11/2023 141 134 - 144 mmol/L Final         Passed - eGFR is 10 or above and within 180 days    GFR calc Af Amer  Date Value Ref Range Status  09/13/2020 66 >59 mL/min/1.73 Final    Comment:    **In accordance with recommendations from the NKF-ASN Task force,**   [facility] is in the process of updating its eGFR calculation to the   2021 CKD-EPI creatinine equation that estimates kidney function   without a race variable.    GFR calc non Af Amer  Date Value Ref Range Status  09/13/2020 57 (L) >59 mL/min/1.73 Final   eGFR  Date Value Ref Range Status  06/11/2023 53 (L) >59 mL/min/1.73 Final         Passed - Patient is not pregnant      Passed - Last BP in normal range    BP Readings from Last 1 Encounters:  06/11/23 104/62         Passed - Valid encounter within last 6 months    Recent Outpatient Visits           1 month ago Type II diabetes mellitus with complication [facility])   [place] [facility] Medicine at [facility], Nyoka Cowden, MD   5 months ago Type II diabetes mellitus with complication [facility])   [place] [facility] Medicine at [facility], Nyoka Cowden, MD   9 months ago Type II diabetes mellitus with complication [facility])   [place]  Primary Care & Sports Medicine at [facility], Nyoka Cowden, MD   1 year ago Essential (primary) hypertension   [place] [facility] Medicine at [facility], Nyoka Cowden, MD   1 year ago Type II diabetes mellitus with complication [facility])   [place] Primary Care & Sports Medicine at [facility], Nyoka Cowden, MD       Future Appointments             In 2 months Judithann Graves, Nyoka Cowden, MD [place] [facility] at [facility], [facility]

## 2023-08-14 ENCOUNTER — Other Ambulatory Visit: Payer: Self-pay | Admitting: Internal Medicine

## 2023-08-14 NOTE — Telephone Encounter (Signed)
Requested Prescriptions  Pending Prescriptions Disp Refills   omega-3 acid ethyl esters (LOVAZA) 1 g capsule [Pharmacy Med Name: OMEGA-3-ACID 1GM CAPSULES (RX)] 360 capsule 2    Sig: TAKE 4 CAPSULES BY MOUTH EVERY DAY     Endocrinology:  Nutritional Agents - omega-3 acid ethyl esters Failed - 08/14/2023  2:52 PM      Failed - Lipid Panel in normal range within the last 12 months    Cholesterol, Total  Date Value Ref Range Status  06/11/2023 123 100 - 199 mg/dL Final   LDL Chol Calc (NIH)  Date Value Ref Range Status  06/11/2023 44 0 - 99 mg/dL Final   HDL  Date Value Ref Range Status  06/11/2023 51 >39 mg/dL Final   Triglycerides  Date Value Ref Range Status  06/11/2023 171 (H) 0 - 149 mg/dL Final         Passed - Valid encounter within last 12 months    Recent Outpatient Visits           2 months ago Type II diabetes mellitus with complication (HCC)   [place] [facility] at [facility], Nyoka Cowden, MD   6 months ago Type II diabetes mellitus with complication [facility])   [place] Primary Care & Sports Medicine at [facility], Nyoka Cowden, MD   10 months ago Type II diabetes mellitus with complication [facility])   [place] Primary Care & Sports Medicine at [facility], Nyoka Cowden, MD   1 year ago Essential (primary) hypertension   [place] Primary Care & Sports Medicine at [facility], Nyoka Cowden, MD   1 year ago Type II diabetes mellitus with complication [facility])   [place] Primary Care & Sports Medicine at [facility], Nyoka Cowden, MD       Future Appointments             In 1 month Judithann Graves, Nyoka Cowden, MD [facility] Primary Care & Sports Medicine at [facility], [facility]

## 2023-09-08 ENCOUNTER — Other Ambulatory Visit: Payer: Self-pay | Admitting: Internal Medicine

## 2023-09-09 NOTE — Telephone Encounter (Signed)
Discontinued None Reubin Milan, MD 03/31/23 1632         Requested Prescriptions  Refused Prescriptions Disp Refills   OZEMPIC, 0.25 OR 0.5 MG/DOSE, 2 MG/3ML SOPN [Pharmacy Med Name: OZEMPIC 0.25 OR 0.5MG  DOS(2MG /3ML)] 3 mL 1    Sig: INJECT 0.5MG  SUBCUTANEOUS ONE DAY A WEEK     Endocrinology:  Diabetes - GLP-1 Receptor Agonists - semaglutide Failed - 09/08/2023 10:26 AM      Failed - HBA1C in normal range and within 180 days    Hgb A1c MFr Bld  Date Value Ref Range Status  06/11/2023 5.8 (H) 4.8 - 5.6 % Final    Comment:             Prediabetes: 5.7 - 6.4          Diabetes: >6.4          Glycemic control for adults with diabetes: <7.0          Failed - Cr in normal range and within 360 days    Creatinine, Ser  Date Value Ref Range Status  06/11/2023 1.08 (H) 0.57 - 1.00 mg/dL Final         Passed - Valid encounter within last 6 months    Recent Outpatient Visits           3 months ago Type II diabetes mellitus with complication (HCC)   [place] [facility] at [facility], Nyoka Cowden, MD   7 months ago Type II diabetes mellitus with complication [facility])   [place] Primary Care & Sports Medicine at [facility], Nyoka Cowden, MD   11 months ago Type II diabetes mellitus with complication [facility])   [place] Primary Care & Sports Medicine at [facility], Nyoka Cowden, MD   1 year ago Essential (primary) hypertension   [place] Primary Care & Sports Medicine at [facility], Nyoka Cowden, MD   1 year ago Type II diabetes mellitus with complication [facility])   [place] Primary Care & Sports Medicine at [facility], Nyoka Cowden, MD       Future Appointments             In 1 month Judithann Graves, Nyoka Cowden, MD [facility] Primary Care & Sports Medicine at [facility], [facility]

## 2023-09-14 ENCOUNTER — Ambulatory Visit
Admission: RE | Admit: 2023-09-14 | Discharge: 2023-09-14 | Disposition: A | Discharge status: Home / Self Care | Payer: Medicare Other | Source: Ambulatory Visit | Attending: Internal Medicine | Admitting: Internal Medicine

## 2023-09-14 DIAGNOSIS — Z1231 Encounter for screening mammogram for malignant neoplasm of breast: Secondary | ICD-10-CM | POA: Diagnosis present

## 2023-10-12 ENCOUNTER — Ambulatory Visit (INDEPENDENT_AMBULATORY_CARE_PROVIDER_SITE_OTHER): Payer: Medicare Other | Admitting: Internal Medicine

## 2023-10-12 ENCOUNTER — Encounter: Payer: Self-pay | Admitting: Internal Medicine

## 2023-10-12 VITALS — BP 126/70 | HR 72 | Ht 68.0 in | Wt 178.0 lb

## 2023-10-12 DIAGNOSIS — I10 Essential (primary) hypertension: Secondary | ICD-10-CM

## 2023-10-12 DIAGNOSIS — E118 Type 2 diabetes mellitus with unspecified complications: Secondary | ICD-10-CM

## 2023-10-12 DIAGNOSIS — Z23 Encounter for immunization: Secondary | ICD-10-CM

## 2023-10-12 DIAGNOSIS — Z7984 Long term (current) use of oral hypoglycemic drugs: Secondary | ICD-10-CM

## 2023-10-12 MED ORDER — OZEMPIC (0.25 OR 0.5 MG/DOSE) 2 MG/3ML ~~LOC~~ SOPN: 0.5000 mg | PEN_INJECTOR | SUBCUTANEOUS | 5 refills | Status: DC

## 2023-10-12 NOTE — Assessment & Plan Note (Signed)
Controlled BP with normal exam. Current regimen is metoprolol and Benicar hct. Will continue same medications; encourage continued reduced sodium diet.

## 2023-10-12 NOTE — Assessment & Plan Note (Signed)
Blood sugars stable without hypoglycemic symptoms or events. Currently managed with glimepiride and MTF.  Out of ozempic since October. Changes made last visit are reducing dose of ozempic. Lab Results  Component Value Date   HGBA1C 5.8 (H) 06/11/2023

## 2023-10-12 NOTE — Progress Notes (Signed)
Date:  10/12/2023   Name:  Kimberly Salazar   DOB:  10-28-1945   MRN:  [id number]   Chief Complaint: Diabetes and Hypertension  Diabetes She presents for her follow-up diabetic visit. She has type 2 diabetes mellitus. Pertinent negatives for hypoglycemia include no headaches or tremors. Pertinent negatives for diabetes include no chest pain, no fatigue, no polydipsia and no polyuria. Current diabetic treatments: glimepiride, metformin and ozempic. An ACE inhibitor/angiotensin II receptor blocker is being taken.  Hypertension This is a chronic problem. The problem is controlled. Pertinent negatives include no chest pain, headaches, palpitations or shortness of breath. Past treatments include angiotensin blockers, beta blockers and diuretics. The current treatment provides significant improvement.  Hypercalcemia - noted on last labs.  Will repeat today and add PTH if still elevated.   Review of Systems  Constitutional:  Negative for appetite change, fatigue, fever and unexpected weight change.  HENT:  Negative for tinnitus and trouble swallowing.   Eyes:  Negative for visual disturbance.  Respiratory:  Negative for cough, chest tightness and shortness of breath.   Cardiovascular:  Negative for chest pain, palpitations and leg swelling.  Gastrointestinal:  Negative for abdominal pain.  Endocrine: Negative for polydipsia and polyuria.  Genitourinary:  Negative for dysuria and hematuria.  Musculoskeletal:  Negative for arthralgias.  Neurological:  Negative for tremors, numbness and headaches.  Psychiatric/Behavioral:  Negative for dysphoric mood.      Lab Results  Component Value Date   NA 141 06/11/2023   K 4.4 06/11/2023   CO2 22 06/11/2023   GLUCOSE 192 (H) 06/11/2023   BUN 16 06/11/2023   CREATININE 1.08 (H) 06/11/2023   CALCIUM 10.5 (H) 06/11/2023   EGFR 53 (L) 06/11/2023   GFRNONAA 57 (L) 09/13/2020   Lab Results  Component Value Date   CHOL 123 06/11/2023   HDL 51  06/11/2023   LDLCALC 44 06/11/2023   TRIG 171 (H) 06/11/2023   CHOLHDL 2.4 06/11/2023   Lab Results  Component Value Date   TSH 2.240 06/11/2023   Lab Results  Component Value Date   HGBA1C 5.8 (H) 06/11/2023   Lab Results  Component Value Date   WBC 7.4 06/11/2023   HGB 13.7 06/11/2023   HCT 40.5 06/11/2023   MCV 91 06/11/2023   PLT 181 06/11/2023   Lab Results  Component Value Date   ALT 25 06/11/2023   AST 25 06/11/2023   ALKPHOS 54 06/11/2023   BILITOT 0.6 06/11/2023   No results found for: "25OHVITD2", "25OHVITD3", "VD25OH"   Patient Active Problem List   Diagnosis Date Noted   Sick sinus syndrome (HCC) 02/09/2023   Ovarian failure 04/09/2017   Basal cell carcinoma, leg 07/15/2016   Abnormal finding on thallium stress test 05/17/2015   Hyperlipidemia associated with type 2 diabetes mellitus (HCC) 04/12/2015   Essential (primary) hypertension 04/12/2015   History of uterine cancer 04/12/2015   Type II diabetes mellitus with complication (HCC) 04/12/2015   Cardiac pacemaker in situ 10/11/2009    No Known Allergies  Past Surgical History:  Procedure Laterality Date   ABDOMINAL HYSTERECTOMY  10/27/1992   Uterine CA   APPENDECTOMY     cataract Right 04/2023   CATARACT EXTRACTION Left 10/27/2009   LITHOTRIPSY  10/28/2003   PACEMAKER INSERTION  10/27/2008    Social History   Tobacco Use   Smoking status: Never   Smokeless tobacco: Never  Vaping Use   Vaping status: Never Used  Substance Use Topics   Alcohol  use: No    Alcohol/week: 0.0 standard drinks of alcohol   Drug use: No     Medication list has been reviewed and updated.  Current Meds  Medication Sig   aspirin 81 MG chewable tablet Chew 1 tablet by mouth daily.   cholecalciferol (VITAMIN D3) 25 MCG (1000 UT) tablet Take 1,000 Units by mouth daily.   glimepiride (AMARYL) 2 MG tablet TAKE 1 TABLET BY MOUTH TWICE DAILY WITH FOOD   glucose blood (ONE TOUCH ULTRA TEST) test strip 1 each by  Other route 2 (two) times daily.   metFORMIN (GLUCOPHAGE-XR) 500 MG 24 hr tablet TAKE 2 TABLETS(1000 MG) BY MOUTH IN THE MORNING AND AT BEDTIME   metoprolol succinate (TOPROL-XL) 25 MG 24 hr tablet TAKE 1 TABLET BY MOUTH EVERY DAY   Multiple Vitamins-Minerals (MULTIVITAL) tablet Take 1 tablet by mouth daily.   Multiple Vitamins-Minerals (ZINC PO) Take by mouth daily.   olmesartan-hydrochlorothiazide (BENICAR HCT) 20-12.5 MG tablet TAKE 1 TABLET BY MOUTH DAILY   omega-3 acid ethyl esters (LOVAZA) 1 g capsule TAKE 4 CAPSULES BY MOUTH EVERY DAY   Semaglutide,0.25 or 0.5MG /DOS, (OZEMPIC, 0.25 OR 0.5 MG/DOSE,) 2 MG/3ML SOPN Inject 0.5 mg into the skin once a week.   simvastatin (ZOCOR) 80 MG tablet TAKE 1 TABLET(80 MG) BY MOUTH DAILY       10/12/2023   11:04 AM 06/11/2023    9:31 AM 02/09/2023   10:01 AM 10/10/2022    9:55 AM  GAD 7 : Generalized Anxiety Score  Nervous, Anxious, on Edge 0 0 0 0  Control/stop worrying 0 0 0 0  Worry too much - different things 0 0 0 0  Trouble relaxing 0 0 0 0  Restless 0 0 0 0  Easily annoyed or irritable 0 0 0 0  Afraid - awful might happen 0 0 0 0  Total GAD 7 Score 0 0 0 0  Anxiety Difficulty Not difficult at all Not difficult at all Not difficult at all Not difficult at all       10/12/2023   11:04 AM 06/11/2023    9:31 AM 02/09/2023   10:01 AM  Depression screen PHQ 2/9  Decreased Interest 0 0 0  Down, Depressed, Hopeless 0 0 0  PHQ - 2 Score 0 0 0  Altered sleeping 0 0 0  Tired, decreased energy 0 0 1  Change in appetite 0 0 0  Feeling bad or failure about yourself  0 0 0  Trouble concentrating 0 0 0  Moving slowly or fidgety/restless 0 0 0  Suicidal thoughts 0 0 0  PHQ-9 Score 0 0 1  Difficult doing work/chores Not difficult at all Not difficult at all Not difficult at all    BP Readings from Last 3 Encounters:  10/12/23 126/70  06/11/23 104/62  02/09/23 128/66    Physical Exam Vitals and nursing note reviewed.  Constitutional:       General: She is not in acute distress.    Appearance: Normal appearance. She is well-developed.  HENT:     Head: Normocephalic and atraumatic.  Neck:     Vascular: No carotid bruit.  Cardiovascular:     Rate and Rhythm: Normal rate and regular rhythm.     Heart sounds: No murmur heard. Pulmonary:     Effort: Pulmonary effort is normal. No respiratory distress.     Breath sounds: No wheezing or rhonchi.  Musculoskeletal:     Cervical back: Normal range of motion.  Right lower leg: No edema.     Left lower leg: No edema.  Lymphadenopathy:     Cervical: No cervical adenopathy.  Skin:    General: Skin is warm and dry.     Capillary Refill: Capillary refill takes less than 2 seconds.     Findings: No rash.  Neurological:     General: No focal deficit present.     Mental Status: She is alert and oriented to person, place, and time.  Psychiatric:        Mood and Affect: Mood normal.        Behavior: Behavior normal.     Wt Readings from Last 3 Encounters:  10/12/23 178 lb (80.7 kg)  06/11/23 171 lb 12.8 oz (77.9 kg)  02/09/23 188 lb (85.3 kg)    BP 126/70   Pulse 72   Ht 5\' 8"  (1.727 m)   Wt 178 lb (80.7 kg)   SpO2 98%   BMI 27.06 kg/m   Assessment and Plan:  Problem List Items Addressed This Visit       Unprioritized   Essential (primary) hypertension (Chronic)   Controlled BP with normal exam. Current regimen is metoprolol and Benicar hct. Will continue same medications; encourage continued reduced sodium diet.       Type II diabetes mellitus with complication (HCC) - Primary (Chronic)   Blood sugars stable without hypoglycemic symptoms or events. Currently managed with glimepiride and MTF.  Out of ozempic since October. Changes made last visit are reducing dose of ozempic. Lab Results  Component Value Date   HGBA1C 5.8 (H) 06/11/2023         Relevant Medications   Semaglutide,0.25 or 0.5MG /DOS, (OZEMPIC, 0.25 OR 0.5 MG/DOSE,) 2 MG/3ML SOPN    Other Relevant Orders   Basic metabolic panel   Hemoglobin A1c   Other Visit Diagnoses       Hypercalcemia       Relevant Orders   Basic metabolic panel     Long term current use of oral hypoglycemic drug         Need for influenza vaccination       Relevant Orders   Flu Vaccine Trivalent High Dose (Fluad) (Completed)       Return in about 4 months (around 02/10/2024) for DM, HTN.    Reubin Milan, MD [facility] Primary Care and Sports Medicine [place]

## 2023-10-13 LAB — BASIC METABOLIC PANEL
BUN/Creatinine Ratio: 20 (ref 12–28)
BUN: 19 mg/dL (ref 8–27)
CO2: 20 mmol/L (ref 20–29)
Calcium: 10 mg/dL (ref 8.7–10.3)
Chloride: 104 mmol/L (ref 96–106)
Creatinine, Ser: 0.95 mg/dL (ref 0.57–1.00)
Glucose: 198 mg/dL — ABNORMAL HIGH (ref 70–99)
Potassium: 5 mmol/L (ref 3.5–5.2)
Sodium: 141 mmol/L (ref 134–144)
eGFR: 61 mL/min/{1.73_m2} (ref >59)

## 2023-10-13 LAB — HEMOGLOBIN A1C
Est. average glucose Bld gHb Est-mCnc: 143 mg/dL
Hgb A1c MFr Bld: 6.6 % — ABNORMAL HIGH (ref 4.8–5.6)

## 2023-11-05 ENCOUNTER — Other Ambulatory Visit: Payer: Self-pay | Admitting: Internal Medicine

## 2023-11-05 DIAGNOSIS — E1169 Type 2 diabetes mellitus with other specified complication: Secondary | ICD-10-CM

## 2023-11-05 DIAGNOSIS — I10 Essential (primary) hypertension: Secondary | ICD-10-CM

## 2023-11-09 NOTE — Telephone Encounter (Signed)
 Requested Prescriptions  Pending Prescriptions Disp Refills   simvastatin  (ZOCOR ) 80 MG tablet [Pharmacy Med Name: SIMVASTATIN  80MG  TABLETS] 90 tablet 0    Sig: TAKE 1 TABLET(80 MG) BY MOUTH DAILY     Cardiovascular:  Antilipid - Statins Failed - 11/09/2023 10:18 AM      Failed - Lipid Panel in normal range within the last 12 months    Cholesterol, Total  Date Value Ref Range Status  06/11/2023 123 100 - 199 mg/dL Final   LDL Chol Calc (NIH)  Date Value Ref Range Status  06/11/2023 44 0 - 99 mg/dL Final   HDL  Date Value Ref Range Status  06/11/2023 51 >39 mg/dL Final   Triglycerides  Date Value Ref Range Status  06/11/2023 171 (H) 0 - 149 mg/dL Final         Passed - Patient is not pregnant      Passed - Valid encounter within last 12 months    Recent Outpatient Visits           4 weeks ago Type II diabetes mellitus with complication (HCC)   [place] [facility] at [facility], Leita DEL, MD   5 months ago Type II diabetes mellitus with complication [facility])   [place] Primary Care & Sports Medicine at [facility], Leita DEL, MD   9 months ago Type II diabetes mellitus with complication [facility])   [place] [facility] Care & Sports Medicine at [facility], Leita DEL, MD   1 year ago Type II diabetes mellitus with complication [facility])   [place] [facility] Medicine at [facility], Leita DEL, MD   1 year ago Essential (primary) hypertension   [place] [facility] Sports Medicine at [facility], Leita DEL, MD       Future Appointments             In 3 months Justus Leita DEL, MD [facility] Primary Care & Sports Medicine at [facility], [facility]             metoprolol  succinate (TOPROL -XL) 25 MG 24 hr tablet [Pharmacy Med Name: METOPROLOL  ER SUCCINATE 25MG  TABS] 90 tablet 0    Sig: TAKE 1 TABLET BY MOUTH EVERY DAY     Cardiovascular:  Beta Blockers Passed - 11/09/2023  10:18 AM      Passed - Last BP in normal range    BP Readings from Last 1 Encounters:  10/12/23 126/70         Passed - Last Heart Rate in normal range    Pulse Readings from Last 1 Encounters:  10/12/23 72         Passed - Valid encounter within last 6 months    Recent Outpatient Visits           4 weeks ago Type II diabetes mellitus with complication [facility])   [place] [facility] Care & Sports Medicine at [facility], Leita DEL, MD   5 months ago Type II diabetes mellitus with complication [facility])   [place] [facility] Care & Sports Medicine at [facility], Leita DEL, MD   9 months ago Type II diabetes mellitus with complication [facility])    Primary Care & Sports Medicine at [facility], Leita DEL, MD   1 year ago Type II diabetes mellitus with complication [facility])    Primary Care & Sports Medicine at [facility], Leita  H, MD   1 year ago Essential (primary) hypertension   [place] Primary Care & Sports Medicine at [facility], Leita DEL, MD       Future Appointments             In 3 months Justus Leita DEL, MD [facility] Primary Care & Sports Medicine at [facility], [facility]             olmesartan -hydrochlorothiazide (BENICAR  HCT) 20-12.5 MG tablet [Pharmacy Med Name: OLMESARTAN  MEDOX/HCTZ 20-12.5MG  TAB] 90 tablet 0    Sig: TAKE 1 TABLET BY MOUTH DAILY     Cardiovascular: ARB + Diuretic Combos Passed - 11/09/2023 10:18 AM      Passed - K in normal range and within 180 days    Potassium  Date Value Ref Range Status  10/12/2023 5.0 3.5 - 5.2 mmol/L Final         Passed - Na in normal range and within 180 days    Sodium  Date Value Ref Range Status  10/12/2023 141 134 - 144 mmol/L Final         Passed - Cr in normal range and within 180 days    Creatinine, Ser  Date Value Ref Range Status  10/12/2023 0.95 0.57 - 1.00 mg/dL Final         Passed - eGFR is 10 or above and within  180 days    GFR calc Af Amer  Date Value Ref Range Status  09/13/2020 66 >59 mL/min/1.73 Final    Comment:    **In accordance with recommendations from the NKF-ASN Task force,**   [facility] is in the process of updating its eGFR calculation to the   2021 CKD-EPI creatinine equation that estimates kidney function   without a race variable.    GFR calc non Af Amer  Date Value Ref Range Status  09/13/2020 57 (L) >59 mL/min/1.73 Final   eGFR  Date Value Ref Range Status  10/12/2023 61 >59 mL/min/1.73 Final         Passed - Patient is not pregnant      Passed - Last BP in normal range    BP Readings from Last 1 Encounters:  10/12/23 126/70         Passed - Valid encounter within last 6 months    Recent Outpatient Visits           4 weeks ago Type II diabetes mellitus with complication [facility])   [place] [facility] Medicine at [facility], Leita DEL, MD   5 months ago Type II diabetes mellitus with complication [facility])   [place] [facility] Care & Sports Medicine at [facility], Leita DEL, MD   9 months ago Type II diabetes mellitus with complication [facility])   [place] [facility] Care & Sports Medicine at [facility], Leita DEL, MD   1 year ago Type II diabetes mellitus with complication [facility])   [place] Primary Care & Sports Medicine at [facility], Leita DEL, MD   1 year ago Essential (primary) hypertension    Primary Care & Sports Medicine at [facility], Leita DEL, MD       Future Appointments             In 3 months Justus, Leita DEL, MD [facility] Primary Care & Sports Medicine at [facility], [facility]

## 2023-11-24 ENCOUNTER — Other Ambulatory Visit: Payer: Self-pay | Admitting: Internal Medicine

## 2023-11-24 DIAGNOSIS — I10 Essential (primary) hypertension: Secondary | ICD-10-CM

## 2023-11-24 NOTE — Telephone Encounter (Signed)
Request is too soon for refill, last refill 11/09/23.  Requested Prescriptions  Pending Prescriptions Disp Refills   olmesartan-hydrochlorothiazide (BENICAR HCT) 20-12.5 MG tablet [Pharmacy Med Name: OLMESARTAN MEDOX/HCTZ 20-12.5MG  TAB] 90 tablet 0    Sig: TAKE 1 TABLET BY MOUTH DAILY     Cardiovascular: ARB + Diuretic Combos Passed - 11/24/2023  3:49 PM      Passed - K in normal range and within 180 days    Potassium  Date Value Ref Range Status  10/12/2023 5.0 3.5 - 5.2 mmol/L Final         Passed - Na in normal range and within 180 days    Sodium  Date Value Ref Range Status  10/12/2023 141 134 - 144 mmol/L Final         Passed - Cr in normal range and within 180 days    Creatinine, Ser  Date Value Ref Range Status  10/12/2023 0.95 0.57 - 1.00 mg/dL Final         Passed - eGFR is 10 or above and within 180 days    GFR calc Af Amer  Date Value Ref Range Status  09/13/2020 66 >59 mL/min/1.73 Final    Comment:    **In accordance with recommendations from the NKF-ASN Task force,**   [facility] is in the process of updating its eGFR calculation to the   2021 CKD-EPI creatinine equation that estimates kidney function   without a race variable.    GFR calc non Af Amer  Date Value Ref Range Status  09/13/2020 57 (L) >59 mL/min/1.73 Final   eGFR  Date Value Ref Range Status  10/12/2023 61 >59 mL/min/1.73 Final         Passed - Patient is not pregnant      Passed - Last BP in normal range    BP Readings from Last 1 Encounters:  10/12/23 126/70         Passed - Valid encounter within last 6 months    Recent Outpatient Visits           1 month ago Type II diabetes mellitus with complication [facility])   [place] [facility] Care [facility] Sports Medicine at [facility], Nyoka Cowden, MD   5 months ago Type II diabetes mellitus with complication [facility])   [place] Primary Care & Sports Medicine at [facility], Nyoka Cowden, MD   9 months ago Type II diabetes  mellitus with complication [facility])   [place] Primary Care & Sports Medicine at [facility], Nyoka Cowden, MD   1 year ago Type II diabetes mellitus with complication [facility])   [place] Primary Care & Sports Medicine at [facility], Nyoka Cowden, MD   1 year ago Essential (primary) hypertension   [place] Primary Care & Sports Medicine at [facility], Nyoka Cowden, MD       Future Appointments             In 2 months Judithann Graves, Nyoka Cowden, MD [facility] Primary Care & Sports Medicine at [facility], [facility]

## 2023-11-26 ENCOUNTER — Other Ambulatory Visit: Payer: Self-pay | Admitting: Internal Medicine

## 2023-11-26 DIAGNOSIS — I10 Essential (primary) hypertension: Secondary | ICD-10-CM

## 2023-11-27 NOTE — Telephone Encounter (Signed)
Requested Prescriptions  Refused Prescriptions Disp Refills   olmesartan-hydrochlorothiazide (BENICAR HCT) 20-12.5 MG tablet [Pharmacy Med Name: OLMESARTAN MEDOX/HCTZ 20-12.5MG  TAB] 90 tablet 0    Sig: TAKE 1 TABLET BY MOUTH DAILY     Cardiovascular: ARB + Diuretic Combos Passed - 11/27/2023 11:51 AM      Passed - K in normal range and within 180 days    Potassium  Date Value Ref Range Status  10/12/2023 5.0 3.5 - 5.2 mmol/L Final         Passed - Na in normal range and within 180 days    Sodium  Date Value Ref Range Status  10/12/2023 141 134 - 144 mmol/L Final         Passed - Cr in normal range and within 180 days    Creatinine, Ser  Date Value Ref Range Status  10/12/2023 0.95 0.57 - 1.00 mg/dL Final         Passed - eGFR is 10 or above and within 180 days    GFR calc Af Amer  Date Value Ref Range Status  09/13/2020 66 >59 mL/min/1.73 Final    Comment:    **In accordance with recommendations from the NKF-ASN Task force,**   [facility] is in the process of updating its eGFR calculation to the   2021 CKD-EPI creatinine equation that estimates kidney function   without a race variable.    GFR calc non Af Amer  Date Value Ref Range Status  09/13/2020 57 (L) >59 mL/min/1.73 Final   eGFR  Date Value Ref Range Status  10/12/2023 61 >59 mL/min/1.73 Final         Passed - Patient is not pregnant      Passed - Last BP in normal range    BP Readings from Last 1 Encounters:  10/12/23 126/70         Passed - Valid encounter within last 6 months    Recent Outpatient Visits           1 month ago Type II diabetes mellitus with complication [facility])   [place] [facility] Care [facility] Sports Medicine at [facility], Nyoka Cowden, MD   5 months ago Type II diabetes mellitus with complication [facility])   [place] [facility] Care [facility] Sports Medicine at [facility], Nyoka Cowden, MD   9 months ago Type II diabetes mellitus with complication [facility])   [place] Primary Care &  Sports Medicine at [facility], Nyoka Cowden, MD   1 year ago Type II diabetes mellitus with complication [facility])   [place] Primary Care & Sports Medicine at [facility], Nyoka Cowden, MD   1 year ago Essential (primary) hypertension   [place] Primary Care & Sports Medicine at [facility], Nyoka Cowden, MD       Future Appointments             In 2 months Judithann Graves, Nyoka Cowden, MD [facility] Primary Care & Sports Medicine at [facility], [facility]

## 2023-11-30 ENCOUNTER — Other Ambulatory Visit: Payer: Self-pay | Admitting: Internal Medicine

## 2023-11-30 DIAGNOSIS — I10 Essential (primary) hypertension: Secondary | ICD-10-CM

## 2023-11-30 MED ORDER — OLMESARTAN MEDOXOMIL-HCTZ 20-12.5 MG PO TABS: 1.0000 | ORAL_TABLET | Freq: Every day | ORAL | 0 refills | Status: DC

## 2023-11-30 NOTE — Telephone Encounter (Signed)
[facility] pharmacy, staff state that they did not receive a refill request for the following medication: Requested Prescriptions   Pending Prescriptions Disp Refills   olmesartan-hydrochlorothiazide (BENICAR HCT) 20-12.5 MG tablet 90 tablet 0    Sig: Take 1 tablet by mouth daily.   Will send in a refill.

## 2023-11-30 NOTE — Telephone Encounter (Signed)
   Caller states pharmacy does not have prescription olmesartan-hydrochlorothiazide (BENICAR HCT) 20-12.5 MG tablet. Request was sent in on 1/9, 1/28 and 1/30.   Patient chart reflects the below  E-Prescribing Status: Receipt confirmed by pharmacy (11/09/2023 10:18 AM EST)     The pharmacy confirmed no prescription on file and requesting a new Rx.   [id number] - [place], [place] - [id number] AT [facility] [address] Phone: [phone]  Fax: [phone]

## 2023-11-30 NOTE — Telephone Encounter (Signed)
Requested Prescriptions  Pending Prescriptions Disp Refills   olmesartan-hydrochlorothiazide (BENICAR HCT) 20-12.5 MG tablet 90 tablet 0    Sig: Take 1 tablet by mouth daily.     Cardiovascular: ARB + Diuretic Combos Passed - 11/30/2023 12:32 PM      Passed - K in normal range and within 180 days    Potassium  Date Value Ref Range Status  10/12/2023 5.0 3.5 - 5.2 mmol/L Final         Passed - Na in normal range and within 180 days    Sodium  Date Value Ref Range Status  10/12/2023 141 134 - 144 mmol/L Final         Passed - Cr in normal range and within 180 days    Creatinine, Ser  Date Value Ref Range Status  10/12/2023 0.95 0.57 - 1.00 mg/dL Final         Passed - eGFR is 10 or above and within 180 days    GFR calc Af Amer  Date Value Ref Range Status  09/13/2020 66 >59 mL/min/1.73 Final    Comment:    **In accordance with recommendations from the NKF-ASN Task force,**   [facility] is in the process of updating its eGFR calculation to the   2021 CKD-EPI creatinine equation that estimates kidney function   without a race variable.    GFR calc non Af Amer  Date Value Ref Range Status  09/13/2020 57 (L) >59 mL/min/1.73 Final   eGFR  Date Value Ref Range Status  10/12/2023 61 >59 mL/min/1.73 Final         Passed - Patient is not pregnant      Passed - Last BP in normal range    BP Readings from Last 1 Encounters:  10/12/23 126/70         Passed - Valid encounter within last 6 months    Recent Outpatient Visits           1 month ago Type II diabetes mellitus with complication [facility])   [place] Primary Care & Sports Medicine at [facility], Nyoka Cowden, MD   5 months ago Type II diabetes mellitus with complication [facility])   [place] Primary Care & Sports Medicine at [facility], Nyoka Cowden, MD   9 months ago Type II diabetes mellitus with complication [facility])   [place] Primary Care & Sports Medicine at [facility], Nyoka Cowden,  MD   1 year ago Type II diabetes mellitus with complication [facility])   [place] Primary Care [facility] Sports Medicine at [facility], Nyoka Cowden, MD   1 year ago Essential (primary) hypertension   [place] [facility] & Sports Medicine at [facility], Nyoka Cowden, MD       Future Appointments             In 2 months Judithann Graves, Nyoka Cowden, MD [facility] Primary Care & Sports Medicine at [facility], [facility]

## 2023-12-10 ENCOUNTER — Other Ambulatory Visit: Payer: Self-pay | Admitting: Internal Medicine

## 2023-12-10 DIAGNOSIS — E1169 Type 2 diabetes mellitus with other specified complication: Secondary | ICD-10-CM

## 2023-12-11 NOTE — Telephone Encounter (Signed)
Metoprolol reordered 11/09/23 #90    simvastatin 11/09/23 #90  Requested Prescriptions  Refused Prescriptions Disp Refills   simvastatin (ZOCOR) 80 MG tablet [Pharmacy Med Name: SIMVASTATIN 80MG  TABLETS] 90 tablet 0    Sig: TAKE 1 TABLET(80 MG) BY MOUTH DAILY     Cardiovascular:  Antilipid - Statins Failed - 12/11/2023  2:45 PM      Failed - Lipid Panel in normal range within the last 12 months    Cholesterol, Total  Date Value Ref Range Status  06/11/2023 123 100 - 199 mg/dL Final   LDL Chol Calc (NIH)  Date Value Ref Range Status  06/11/2023 44 0 - 99 mg/dL Final   HDL  Date Value Ref Range Status  06/11/2023 51 >39 mg/dL Final   Triglycerides  Date Value Ref Range Status  06/11/2023 171 (H) 0 - 149 mg/dL Final         Passed - Patient is not pregnant      Passed - Valid encounter within last 12 months    Recent Outpatient Visits           2 months ago Type II diabetes mellitus with complication (HCC)   [place] [facility] at [facility], Nyoka Cowden, MD   6 months ago Type II diabetes mellitus with complication [facility])   [place] [facility] at [facility], Nyoka Cowden, MD   10 months ago Type II diabetes mellitus with complication [facility])   [place] [facility] at [facility], Nyoka Cowden, MD   1 year ago Type II diabetes mellitus with complication [facility])   [place] [facility] at [facility], Nyoka Cowden, MD   1 year ago Essential (primary) hypertension   [place] [facility] at [facility], Nyoka Cowden, MD       Future Appointments             In 2 months Judithann Graves, Nyoka Cowden, MD [facility] at [facility], [facility]             metoprolol succinate (TOPROL-XL) 25 MG 24 hr tablet [Pharmacy Med Name: METOPROLOL ER SUCCINATE 25MG  TABS] 90 tablet 0    Sig: TAKE 1 TABLET BY MOUTH  EVERY DAY     Cardiovascular:  Beta Blockers Passed - 12/11/2023  2:45 PM      Passed - Last BP in normal range    BP Readings from Last 1 Encounters:  10/12/23 126/70         Passed - Last Heart Rate in normal range    Pulse Readings from Last 1 Encounters:  10/12/23 72         Passed - Valid encounter within last 6 months    Recent Outpatient Visits           2 months ago Type II diabetes mellitus with complication [facility])   [place] [facility] at [facility], Nyoka Cowden, MD   6 months ago Type II diabetes mellitus with complication [facility])   [place] [facility] at [facility], Nyoka Cowden, MD   10 months ago Type II diabetes mellitus with complication [facility])   [place] [facility] at [facility], Nyoka Cowden, MD   1 year ago Type II diabetes mellitus with complication (HCC)  [facility] at [facility], Nyoka Cowden, MD   1 year ago Essential (primary) hypertension   [facility] at [facility], Nyoka Cowden, MD       Future Appointments             In 2 months Judithann Graves, Nyoka Cowden, MD [facility] at [facility], [facility]

## 2023-12-15 ENCOUNTER — Other Ambulatory Visit: Payer: Self-pay | Admitting: Internal Medicine

## 2023-12-15 DIAGNOSIS — E118 Type 2 diabetes mellitus with unspecified complications: Secondary | ICD-10-CM

## 2023-12-15 DIAGNOSIS — E1169 Type 2 diabetes mellitus with other specified complication: Secondary | ICD-10-CM

## 2023-12-16 NOTE — Telephone Encounter (Signed)
[facility] called and spoke to [place], [employer] about the refill(s) metoprolol and simvastatin requested. Advised it was sent on 11/09/23 #90/0 refill(s). She states that rxs weren't received and pt picked up #90 on 11/30/23. They are requesting next refill to place on file.

## 2023-12-16 NOTE — Telephone Encounter (Signed)
Requested Prescriptions  Pending Prescriptions Disp Refills   simvastatin (ZOCOR) 80 MG tablet [Pharmacy Med Name: SIMVASTATIN 80MG  TABLETS] 90 tablet 0    Sig: TAKE 1 TABLET(80 MG) BY MOUTH DAILY     Cardiovascular:  Antilipid - Statins Failed - 12/16/2023 12:28 PM      Failed - Lipid Panel in normal range within the last 12 months    Cholesterol, Total  Date Value Ref Range Status  06/11/2023 123 100 - 199 mg/dL Final   LDL Chol Calc (NIH)  Date Value Ref Range Status  06/11/2023 44 0 - 99 mg/dL Final   HDL  Date Value Ref Range Status  06/11/2023 51 >39 mg/dL Final   Triglycerides  Date Value Ref Range Status  06/11/2023 171 (H) 0 - 149 mg/dL Final         Passed - Patient is not pregnant      Passed - Valid encounter within last 12 months    Recent Outpatient Visits           2 months ago Type II diabetes mellitus with complication (HCC)   [place] [facility] at [facility], Nyoka Cowden, MD   6 months ago Type II diabetes mellitus with complication [facility])   [place] [facility] at [facility], Nyoka Cowden, MD   10 months ago Type II diabetes mellitus with complication [facility])   [place] [facility] at [facility], Nyoka Cowden, MD   1 year ago Type II diabetes mellitus with complication [facility])   [place] [facility] at [facility], Nyoka Cowden, MD   1 year ago Essential (primary) hypertension   [place] [facility] at [facility], Nyoka Cowden, MD       Future Appointments             In 1 month Judithann Graves, Nyoka Cowden, MD [facility] at [facility]             metFORMIN (GLUCOPHAGE-XR) 500 MG 24 hr tablet [Pharmacy Med Name: METFORMIN ER 500MG  24HR TABS] 360 tablet 1    Sig: TAKE 2 TABLETS(1000 MG) BY MOUTH IN THE MORNING AND AT BEDTIME     Endocrinology:  Diabetes -  Biguanides Failed - 12/16/2023 12:28 PM      Failed - B12 Level in normal range and within 720 days    No results found for: "VITAMINB12"       Passed - Cr in normal range and within 360 days    Creatinine, Ser  Date Value Ref Range Status  10/12/2023 0.95 0.57 - 1.00 mg/dL Final         Passed - HBA1C is between 0 and 7.9 and within 180 days    Hgb A1c MFr Bld  Date Value Ref Range Status  10/12/2023 6.6 (H) 4.8 - 5.6 % Final    Comment:             Prediabetes: 5.7 - 6.4          Diabetes: >6.4          Glycemic control for adults with diabetes: <7.0          Passed - eGFR in normal range and within 360 days    GFR calc Af Amer  Date Value Ref Range Status  09/13/2020 66 >59 mL/min/1.73 Final  Comment:    **In accordance with recommendations from the NKF-ASN Task force,**   [facility] is in the process of updating its eGFR calculation to the   2021 CKD-EPI creatinine equation that estimates kidney function   without a race variable.    GFR calc non Af Amer  Date Value Ref Range Status  09/13/2020 57 (L) >59 mL/min/1.73 Final   eGFR  Date Value Ref Range Status  10/12/2023 61 >59 mL/min/1.73 Final         Passed - Valid encounter within last 6 months    Recent Outpatient Visits           2 months ago Type II diabetes mellitus with complication (HCC)   [place] [facility] at [facility], Nyoka Cowden, MD   6 months ago Type II diabetes mellitus with complication [facility])   [place] [facility] at [facility], Nyoka Cowden, MD   10 months ago Type II diabetes mellitus with complication [facility])   [place] [facility] at [facility], Nyoka Cowden, MD   1 year ago Type II diabetes mellitus with complication [facility])   [place] [facility] at [facility], Nyoka Cowden, MD   1 year ago Essential (primary) hypertension   [place] [facility] at [facility], Nyoka Cowden, MD       Future Appointments             In 1 month Judithann Graves Nyoka Cowden, MD [facility] at [facility]            Passed - CBC within normal limits and completed in the last 12 months    WBC  Date Value Ref Range Status  06/11/2023 7.4 3.4 - 10.8 x10E3/uL Final  05/04/2020 7.7 4.0 - 10.5 K/uL Final   RBC  Date Value Ref Range Status  06/11/2023 4.43 3.77 - 5.28 x10E6/uL Final  05/04/2020 4.56 3.87 - 5.11 MIL/uL Final   Hemoglobin  Date Value Ref Range Status  06/11/2023 13.7 11.1 - 15.9 g/dL Final   Hematocrit  Date Value Ref Range Status  06/11/2023 40.5 34.0 - 46.6 % Final   MCHC  Date Value Ref Range Status  06/11/2023 33.8 31.5 - 35.7 g/dL Final  28/41/3244 01.0 30.0 - 36.0 g/dL Final   [facility]  Date Value Ref Range Status  06/11/2023 30.9 26.6 - 33.0 pg Final  05/04/2020 30.9 26.0 - 34.0 pg Final   MCV  Date Value Ref Range Status  06/11/2023 91 79 - 97 fL Final   No results found for: "PLTCOUNTKUC", "LABPLAT", "POCPLA" RDW  Date Value Ref Range Status  06/11/2023 12.5 11.7 - 15.4 % Final          metoprolol succinate (TOPROL-XL) 25 MG 24 hr tablet [Pharmacy Med Name: METOPROLOL ER SUCCINATE 25MG  TABS] 90 tablet 0    Sig: TAKE 1 TABLET BY MOUTH EVERY DAY     Cardiovascular:  Beta Blockers Passed - 12/16/2023 12:28 PM      Passed - Last BP in normal range    BP Readings from Last 1 Encounters:  10/12/23 126/70         Passed - Last Heart Rate in normal range    Pulse Readings from Last 1 Encounters:  10/12/23 72         Passed - Valid encounter  within last 6 months    Recent Outpatient Visits           2 months ago Type II diabetes mellitus with complication [facility])   [place] [facility] at [facility], Nyoka Cowden, MD   6 months ago Type II diabetes mellitus with complication [facility])   [place] [facility]  at [facility], Nyoka Cowden, MD   10 months ago Type II diabetes mellitus with complication [facility])   [place] [facility] at [facility], Nyoka Cowden, MD   1 year ago Type II diabetes mellitus with complication [facility])   [place] [facility] at [facility], Nyoka Cowden, MD   1 year ago Essential (primary) hypertension   [place] [facility] at [facility], Nyoka Cowden, MD       Future Appointments             In 1 month Judithann Graves, Nyoka Cowden, MD [facility] Primary Care [facility] Medicine at [facility], [facility]

## 2024-02-10 ENCOUNTER — Encounter: Payer: Self-pay | Admitting: Internal Medicine

## 2024-02-10 ENCOUNTER — Ambulatory Visit (INDEPENDENT_AMBULATORY_CARE_PROVIDER_SITE_OTHER): Payer: Self-pay | Admitting: Internal Medicine

## 2024-02-10 VITALS — BP 114/68 | HR 65 | Ht 68.0 in | Wt 176.4 lb

## 2024-02-10 DIAGNOSIS — I495 Sick sinus syndrome: Secondary | ICD-10-CM

## 2024-02-10 DIAGNOSIS — E1169 Type 2 diabetes mellitus with other specified complication: Secondary | ICD-10-CM

## 2024-02-10 DIAGNOSIS — I10 Essential (primary) hypertension: Secondary | ICD-10-CM

## 2024-02-10 DIAGNOSIS — E785 Hyperlipidemia, unspecified: Secondary | ICD-10-CM

## 2024-02-10 DIAGNOSIS — E118 Type 2 diabetes mellitus with unspecified complications: Secondary | ICD-10-CM

## 2024-02-10 DIAGNOSIS — Z7985 Long-term (current) use of injectable non-insulin antidiabetic drugs: Secondary | ICD-10-CM

## 2024-02-10 LAB — POCT GLYCOSYLATED HEMOGLOBIN (HGB A1C): Hemoglobin A1C: 6 % — AB (ref 4.0–5.6)

## 2024-02-10 NOTE — Progress Notes (Signed)
 Date:  02/10/2024   Name:  Kimberly Salazar   DOB:  1945/12/13   MRN:  [id number]   Chief Complaint: Diabetes and Hypertension  Diabetes She presents for her follow-up diabetic visit. She has type 2 diabetes mellitus. Her disease course has been stable. Pertinent negatives for hypoglycemia include no headaches or tremors. Pertinent negatives for diabetes include no chest pain, no fatigue, no polydipsia and no polyuria. Pertinent negatives for diabetic complications include no CVA. Current diabetic treatments: MTF, glimepiride, Ozempic. An ACE inhibitor/angiotensin II receptor blocker is being taken.  Hypertension This is a chronic problem. The problem is controlled. Pertinent negatives include no chest pain, headaches, palpitations or shortness of breath. Past treatments include angiotensin blockers, diuretics and beta blockers. There is no history of kidney disease, CAD/MI or CVA.    Review of Systems  Constitutional:  Negative for appetite change, fatigue, fever and unexpected weight change.  HENT:  Negative for tinnitus and trouble swallowing.   Eyes:  Negative for visual disturbance.  Respiratory:  Negative for cough, chest tightness and shortness of breath.   Cardiovascular:  Negative for chest pain, palpitations and leg swelling.  Gastrointestinal:  Negative for abdominal pain.  Endocrine: Negative for polydipsia and polyuria.  Genitourinary:  Negative for dysuria and hematuria.  Musculoskeletal:  Negative for arthralgias.  Neurological:  Negative for tremors, numbness and headaches.  Psychiatric/Behavioral:  Negative for dysphoric mood.      Lab Results  Component Value Date   NA 141 10/12/2023   K 5.0 10/12/2023   CO2 20 10/12/2023   GLUCOSE 198 (H) 10/12/2023   BUN 19 10/12/2023   CREATININE 0.95 10/12/2023   CALCIUM 10.0 10/12/2023   EGFR 61 10/12/2023   GFRNONAA 57 (L) 09/13/2020   Lab Results  Component Value Date   CHOL 123 06/11/2023   HDL 51 06/11/2023    LDLCALC 44 06/11/2023   TRIG 171 (H) 06/11/2023   CHOLHDL 2.4 06/11/2023   Lab Results  Component Value Date   TSH 2.240 06/11/2023   Lab Results  Component Value Date   HGBA1C 6.0 (A) 02/10/2024   Lab Results  Component Value Date   WBC 7.4 06/11/2023   HGB 13.7 06/11/2023   HCT 40.5 06/11/2023   MCV 91 06/11/2023   PLT 181 06/11/2023   Lab Results  Component Value Date   ALT 25 06/11/2023   AST 25 06/11/2023   ALKPHOS 54 06/11/2023   BILITOT 0.6 06/11/2023   No results found for: "25OHVITD2", "25OHVITD3", "VD25OH"   Patient Active Problem List   Diagnosis Date Noted   Sick sinus syndrome (HCC) 02/09/2023   Ovarian failure 04/09/2017   Basal cell carcinoma, leg 07/15/2016   Abnormal finding on thallium stress test 05/17/2015   Hyperlipidemia associated with type 2 diabetes mellitus (HCC) 04/12/2015   Essential (primary) hypertension 04/12/2015   History of uterine cancer 04/12/2015   Type II diabetes mellitus with complication (HCC) 04/12/2015   Cardiac pacemaker in situ 10/11/2009    No Known Allergies  Past Surgical History:  Procedure Laterality Date   ABDOMINAL HYSTERECTOMY  10/27/1992   Uterine CA   APPENDECTOMY     cataract Right 04/2023   CATARACT EXTRACTION Left 10/27/2009   LITHOTRIPSY  10/28/2003   PACEMAKER INSERTION  10/27/2008    Social History   Tobacco Use   Smoking status: Never   Smokeless tobacco: Never  Vaping Use   Vaping status: Never Used  Substance Use Topics   Alcohol use: No  Alcohol/week: 0.0 standard drinks of alcohol   Drug use: No     Medication list has been reviewed and updated.  Current Meds  Medication Sig   aspirin 81 MG chewable tablet Chew 1 tablet by mouth daily.   cholecalciferol (VITAMIN D3) 25 MCG (1000 UT) tablet Take 1,000 Units by mouth daily.   glimepiride (AMARYL) 2 MG tablet TAKE 1 TABLET BY MOUTH TWICE DAILY WITH FOOD   glucose blood (ONE TOUCH ULTRA TEST) test strip 1 each by Other route 2  (two) times daily.   metFORMIN (GLUCOPHAGE-XR) 500 MG 24 hr tablet TAKE 2 TABLETS(1000 MG) BY MOUTH IN THE MORNING AND AT BEDTIME   metoprolol succinate (TOPROL-XL) 25 MG 24 hr tablet TAKE 1 TABLET BY MOUTH EVERY DAY   Multiple Vitamins-Minerals (MULTIVITAL) tablet Take 1 tablet by mouth daily.   olmesartan-hydrochlorothiazide (BENICAR HCT) 20-12.5 MG tablet Take 1 tablet by mouth daily.   omega-3 acid ethyl esters (LOVAZA) 1 g capsule TAKE 4 CAPSULES BY MOUTH EVERY DAY   Semaglutide,0.25 or 0.5MG /DOS, (OZEMPIC, 0.25 OR 0.5 MG/DOSE,) 2 MG/3ML SOPN Inject 0.5 mg into the skin once a week.   simvastatin (ZOCOR) 80 MG tablet TAKE 1 TABLET(80 MG) BY MOUTH DAILY       02/10/2024    9:49 AM 10/12/2023   11:04 AM 06/11/2023    9:31 AM 02/09/2023   10:01 AM  GAD 7 : Generalized Anxiety Score  Nervous, Anxious, on Edge 0 0 0 0  Control/stop worrying 0 0 0 0  Worry too much - different things 0 0 0 0  Trouble relaxing 0 0 0 0  Restless 0 0 0 0  Easily annoyed or irritable 0 0 0 0  Afraid - awful might happen 0 0 0 0  Total GAD 7 Score 0 0 0 0  Anxiety Difficulty Not difficult at all Not difficult at all Not difficult at all Not difficult at all       02/10/2024    9:48 AM 10/12/2023   11:04 AM 06/11/2023    9:31 AM  Depression screen PHQ 2/9  Decreased Interest 0 0 0  Down, Depressed, Hopeless 0 0 0  PHQ - 2 Score 0 0 0  Altered sleeping 0 0 0  Tired, decreased energy 0 0 0  Change in appetite 0 0 0  Feeling bad or failure about yourself  0 0 0  Trouble concentrating 0 0 0  Moving slowly or fidgety/restless 0 0 0  Suicidal thoughts 0 0 0  PHQ-9 Score 0 0 0  Difficult doing work/chores Not difficult at all Not difficult at all Not difficult at all    BP Readings from Last 3 Encounters:  02/10/24 114/68  10/12/23 126/70  06/11/23 104/62    Physical Exam Vitals and nursing note reviewed.  Constitutional:      General: She is not in acute distress.    Appearance: Normal  appearance. She is well-developed.  HENT:     Head: Normocephalic and atraumatic.  Neck:     Vascular: No carotid bruit.  Cardiovascular:     Rate and Rhythm: Normal rate and regular rhythm.     Heart sounds: No murmur heard. Pulmonary:     Effort: Pulmonary effort is normal. No respiratory distress.     Breath sounds: No wheezing or rhonchi.  Musculoskeletal:     Cervical back: Normal range of motion.     Right lower leg: No edema.     Left lower leg:  No edema.  Lymphadenopathy:     Cervical: No cervical adenopathy.  Skin:    General: Skin is warm and dry.     Capillary Refill: Capillary refill takes less than 2 seconds.     Findings: No rash.  Neurological:     General: No focal deficit present.     Mental Status: She is alert and oriented to person, place, and time.  Psychiatric:        Mood and Affect: Mood normal.        Behavior: Behavior normal.     Wt Readings from Last 3 Encounters:  02/10/24 176 lb 6 oz (80 kg)  10/12/23 178 lb (80.7 kg)  06/11/23 171 lb 12.8 oz (77.9 kg)    BP 114/68   Pulse 65   Ht 5\' 8"  (1.727 m)   Wt 176 lb 6 oz (80 kg)   SpO2 99%   BMI 26.82 kg/m   Assessment and Plan:  Problem List Items Addressed This Visit       Unprioritized   Hyperlipidemia associated with type 2 diabetes mellitus (HCC) (Chronic)   LDL is  Lab Results  Component Value Date   LDLCALC 44 06/11/2023   Current regimen is simvastatin and Lovaza.  No medication side effects noted. Goal LDL is <55.       Essential (primary) hypertension (Chronic)   Blood pressure is well controlled.  Current medications Benicar hct and metoprolol.  Will continue same regimen along with efforts to limit dietary sodium.       Type II diabetes mellitus with complication (HCC) - Primary (Chronic)   Blood sugars have been stable.  No recent hypoglycemic events requiring assistance. Currently medications are MTF, glimepiride and Ozempic. Lab Results  Component Value Date    HGBA1C 6.6 (H) 10/12/2023   Last visit no changes were made. A1C today is 6.0.      Relevant Orders   POCT glycosylated hemoglobin (Hb A1C) (Completed)   Sick sinus syndrome [facility])   S/p pacemaker - doing well without chest pain, tachycardia or fatigue Sees Cardiology regularly for [facility] interrogation.      Other Visit Diagnoses       Long-term current use of injectable noninsulin antidiabetic medication           Return in about 4 months (around 06/11/2024) for Medicare annual.    Sheron Dixons, MD [facility] [place]

## 2024-02-10 NOTE — Assessment & Plan Note (Signed)
 LDL is  Lab Results  Component Value Date   LDLCALC 44 06/11/2023   Current regimen is simvastatin and Lovaza.  No medication side effects noted. Goal LDL is <55.

## 2024-02-10 NOTE — Assessment & Plan Note (Signed)
 S/p pacemaker - doing well without chest pain, tachycardia or fatigue Sees Cardiology regularly for [facility] interrogation.

## 2024-02-10 NOTE — Assessment & Plan Note (Signed)
 Blood pressure is well controlled.  Current medications Benicar hct and metoprolol.  Will continue same regimen along with efforts to limit dietary sodium.

## 2024-02-10 NOTE — Assessment & Plan Note (Addendum)
 Blood sugars have been stable.  No recent hypoglycemic events requiring assistance. Currently medications are MTF, glimepiride and Ozempic. Lab Results  Component Value Date   HGBA1C 6.6 (H) 10/12/2023   Last visit no changes were made. A1C today is 6.0.

## 2024-02-11 ENCOUNTER — Ambulatory Visit (INDEPENDENT_AMBULATORY_CARE_PROVIDER_SITE_OTHER): Admitting: Emergency Medicine

## 2024-02-11 VITALS — Ht 68.0 in | Wt 176.0 lb

## 2024-02-11 DIAGNOSIS — Z0000 Encounter for general adult medical examination without abnormal findings: Secondary | ICD-10-CM | POA: Diagnosis not present

## 2024-02-11 DIAGNOSIS — Z1231 Encounter for screening mammogram for malignant neoplasm of breast: Secondary | ICD-10-CM

## 2024-02-11 NOTE — Patient Instructions (Addendum)
 Kimberly Salazar , Thank you for taking time to come for your Medicare Wellness Visit. I appreciate your ongoing commitment to your health goals. Please review the following plan we discussed and let me know if I can assist you in the future.   Referrals/Orders/Follow-Ups/Clinician Recommendations: I have placed an order for a mammogram (due 09/13/24). Call [facility] @ [phone] to schedule. Check with your local pharmacy about getting a tetanus and shingles vaccines.  This is a list of the screening recommended for you and due dates:  Health Maintenance  Topic Date Due   Zoster (Shingles) Vaccine (1 of 2) 05/17/1965   DTaP/Tdap/Td vaccine (2 - Td or Tdap) 06/01/2021   Eye exam for diabetics  01/22/2022   Flu Shot  05/27/2024   Yearly kidney health urinalysis for diabetes  06/10/2024   Complete foot exam   06/10/2024   Hemoglobin A1C  08/11/2024   Mammogram  09/13/2024   Yearly kidney function blood test for diabetes  10/11/2024   Medicare Annual Wellness Visit  02/10/2025   DEXA scan (bone density measurement)  05/15/2026   Colon Cancer Screening  08/30/2028   Pneumonia Vaccine  Completed   Hepatitis C Screening  Completed   HPV Vaccine  Aged Out   Meningitis B Vaccine  Aged Out   COVID-19 Vaccine  Discontinued    Advanced directives: (Declined) Advance directive discussed with you today. Even though you declined this today, please call our office should you change your mind, and we can give you the proper paperwork for you to fill out.  Next Medicare Annual Wellness Visit scheduled for next year: Yes, 02/23/25 @ 2:30pm (phone visit)

## 2024-02-11 NOTE — Progress Notes (Signed)
 Subjective:   Kimberly Salazar is a 78 y.o. who presents for a Medicare Wellness preventive visit.  Visit Complete: Virtual I connected with  Kimberly Salazar on 02/11/24 by a audio enabled telemedicine application and verified that I am speaking with the correct person using two identifiers.  Patient Location: Home  Provider Location: Home Office  I discussed the limitations of evaluation and management by telemedicine. The patient expressed understanding and agreed to proceed.  Vital Signs: Because this visit was a virtual/telehealth visit, some criteria may be missing or patient reported. Any vitals not documented were not able to be obtained and vitals that have been documented are patient reported.  VideoDeclined- This patient declined Librarian, academic. Therefore the visit was completed with audio only.  Persons Participating in Visit: Patient.  AWV Questionnaire: No: Patient Medicare AWV questionnaire was not completed prior to this visit.  Cardiac Risk Factors include: advanced age (>66men, >56 women);diabetes mellitus;hypertension;dyslipidemia     Objective:    Today's Vitals   02/11/24 1511  Weight: 176 lb (79.8 kg)  Height: 5\' 8"  (1.727 m)   Body mass index is 26.76 kg/m.     02/11/2024    3:22 PM 02/09/2023   10:12 AM 10/02/2021    2:49 PM 10/01/2020    2:47 PM 09/28/2019    3:34 PM 03/23/2018    8:26 AM 11/19/2015    8:36 AM  Advanced Directives  Does Patient Have a Medical Advance Directive? No No No No No No No  Would patient like information on creating a medical advance directive? No - Patient declined  No - Patient declined No - Patient declined Yes (MAU/Ambulatory/Procedural Areas - Information given) No - Patient declined No - patient declined information    Current Medications (verified) Outpatient Encounter Medications as of 02/11/2024  Medication Sig   aspirin 81 MG chewable tablet Chew 1 tablet by mouth daily.    cholecalciferol (VITAMIN D3) 25 MCG (1000 UT) tablet Take 1,000 Units by mouth daily.   glimepiride (AMARYL) 2 MG tablet TAKE 1 TABLET BY MOUTH TWICE DAILY WITH FOOD   metFORMIN (GLUCOPHAGE-XR) 500 MG 24 hr tablet TAKE 2 TABLETS(1000 MG) BY MOUTH IN THE MORNING AND AT BEDTIME   metoprolol succinate (TOPROL-XL) 25 MG 24 hr tablet TAKE 1 TABLET BY MOUTH EVERY DAY   Multiple Vitamins-Minerals (MULTIVITAL) tablet Take 1 tablet by mouth daily.   olmesartan-hydrochlorothiazide (BENICAR HCT) 20-12.5 MG tablet Take 1 tablet by mouth daily.   omega-3 acid ethyl esters (LOVAZA) 1 g capsule TAKE 4 CAPSULES BY MOUTH EVERY DAY   Semaglutide,0.25 or 0.5MG /DOS, (OZEMPIC, 0.25 OR 0.5 MG/DOSE,) 2 MG/3ML SOPN Inject 0.5 mg into the skin once a week.   simvastatin (ZOCOR) 80 MG tablet TAKE 1 TABLET(80 MG) BY MOUTH DAILY   glucose blood (ONE TOUCH ULTRA TEST) test strip 1 each by Other route 2 (two) times daily. (Patient not taking: Reported on 02/11/2024)   No facility-administered encounter medications on file as of 02/11/2024.    Allergies (verified) Patient has no known allergies.   History: Past Medical History:  Diagnosis Date   Diabetes mellitus without complication (HCC)    Hyperlipidemia    Hypertension    Past Surgical History:  Procedure Laterality Date   ABDOMINAL HYSTERECTOMY  10/27/1992   Uterine CA   APPENDECTOMY     cataract Right 04/2023   CATARACT EXTRACTION Left 10/27/2009   LITHOTRIPSY  10/28/2003   PACEMAKER INSERTION  10/27/2008   Family History  Problem Relation Age of Onset   Diabetes Mother    Heart failure Father    Breast cancer Neg Hx    Social History   Socioeconomic History   Marital status: Widowed    Spouse name: Not on file   Number of children: 2   Years of education: Not on file   Highest education level: Some college, no degree  Occupational History   Occupation: retired  Tobacco Use   Smoking status: Never    Passive exposure: Never   Smokeless  tobacco: Never  Vaping Use   Vaping status: Never Used  Substance and Sexual Activity   Alcohol use: No    Alcohol/week: 0.0 standard drinks of alcohol   Drug use: No   Sexual activity: Not on file  Other Topics Concern   Not on file  Social History Narrative   Pt's daughter lives with her   Social Drivers of Health   Financial Resource Strain: Low Risk  (02/11/2024)   Overall Financial Resource Strain (CARDIA)    Difficulty of Paying Living Expenses: Not hard at all  Food Insecurity: No Food Insecurity (02/11/2024)   Hunger Vital Sign    Worried About Running Out of Food in the Last Year: Never true    Ran Out of Food in the Last Year: Never true  Transportation Needs: No Transportation Needs (02/11/2024)   PRAPARE - Administrator, Civil Service (Medical): No    Lack of Transportation (Non-Medical): No  Physical Activity: Insufficiently Active (02/11/2024)   Exercise Vital Sign    Days of Exercise per Week: 3 days    Minutes of Exercise per Session: 20 min  Stress: No Stress Concern Present (02/11/2024)   [employer] of Occupational Health - Occupational Stress Questionnaire    Feeling of Stress : Not at all  Social Connections: Socially Isolated (02/11/2024)   Social Connection and Isolation Panel [NHANES]    Frequency of Communication with Friends and Family: More than three times a week    Frequency of Social Gatherings with Friends and Family: More than three times a week    Attends Religious Services: Never    Database administrator or Organizations: No    Attends Banker Meetings: Never    Marital Status: Widowed    Tobacco Counseling Counseling given: Not Answered    Clinical Intake:  Pre-visit preparation completed: Yes  Pain : No/denies pain     BMI - recorded: 26.76 Nutritional Status: BMI 25 -29 Overweight Nutritional Risks: None Diabetes: Yes CBG done?: No Did pt. bring in CBG monitor from home?: No  Lab Results   Component Value Date   HGBA1C 6.0 (A) 02/10/2024   HGBA1C 6.6 (H) 10/12/2023   HGBA1C 5.8 (H) 06/11/2023     How often do you need to have someone help you when you read instructions, pamphlets, or other written materials from your doctor or pharmacy?: 1 - Never  Interpreter Needed?: No  Information entered by :: Kimberly Salazar, CMA   Activities of Daily Living     02/11/2024    3:13 PM  In your present state of health, do you have any difficulty performing the following activities:  Hearing? 0  Vision? 0  Difficulty concentrating or making decisions? 0  Walking or climbing stairs? 0  Dressing or bathing? 0  Doing errands, shopping? 0  Preparing Food and eating ? N  Using the Toilet? N  In the past six months, have you accidently  leaked urine? N  Do you have problems with loss of bowel control? N  Managing your Medications? N  Housekeeping or managing your Housekeeping? N    Patient Care Team: Reubin Milan, MD as PCP - General (Internal Medicine) Etta Quill, MD as Referring Physician (Cardiology) Dellia Cloud Judeth Cornfield, MD as Referring Physician (Dermatology) Fransisco Hertz (Ophthalmology)  Indicate any recent Medical Services you may have received from other than [facility] providers in the past year (date may be approximate).     Assessment:   This is a routine wellness examination for Shubuta.  Hearing/Vision screen Hearing Screening - Comments:: Denies hearing loss Vision Screening - Comments:: Gets DM eye exams, Dr Fransisco Hertz, [facility]. Will request most recent eye exam from 2024   Goals Addressed             This Visit's Progress    Patient Stated       Purge things in her home that she doesn't need and renovate home in [place]       Depression Screen     02/11/2024    3:19 PM 02/10/2024    9:48 AM 10/12/2023   11:04 AM 06/11/2023    9:31 AM 02/09/2023   10:01 AM 10/10/2022    9:55 AM 06/10/2022   10:41 AM  PHQ 2/9 Scores  PHQ - 2 Score  0 0 0 0 0 0 0  PHQ- 9 Score 0 0 0 0 1 0 0    Fall Risk     02/11/2024    3:28 PM 02/10/2024    9:48 AM 10/12/2023   11:04 AM 06/11/2023    9:31 AM 02/09/2023   10:01 AM  Fall Risk   Falls in the past year? 0 0 0 0 0  Number falls in past yr: 0 0 0 0 0  Injury with Fall? 0 0 0 0 0  Risk for fall due to : No Fall Risks No Fall Risks No Fall Risks No Fall Risks No Fall Risks  Follow up Falls prevention discussed;Falls evaluation completed Falls evaluation completed Falls evaluation completed Falls evaluation completed Falls evaluation completed    MEDICARE RISK AT HOME:  Medicare Risk at Home Any stairs in or around the home?: Yes (4 steps) If so, are there any without handrails?: Yes Home free of loose throw rugs in walkways, pet beds, electrical cords, etc?: Yes Adequate lighting in your home to reduce risk of falls?: Yes Life alert?: No Use of a cane, walker or w/c?: No Grab bars in the bathroom?: No Shower chair or bench in shower?: No Elevated toilet seat or a handicapped toilet?: Yes  TIMED UP AND GO:  Was the test performed?  No  Cognitive Function: 6CIT completed        02/11/2024    3:30 PM 02/09/2023   10:12 AM 09/28/2019    3:37 PM  6CIT Screen  What Year? 0 points 0 points 0 points  What month? 0 points 0 points 0 points  What time? 0 points 0 points 0 points  Count back from 20 0 points 0 points 0 points  Months in reverse 0 points 0 points 0 points  Repeat phrase 0 points 0 points 0 points  Total Score 0 points 0 points 0 points    Immunizations Immunization History  Administered Date(s) Administered   Fluad Quad(high Dose 65+) 09/08/2019, 09/13/2020, 09/10/2021, 10/10/2022   Fluad Trivalent(High Dose 65+) 10/12/2023   Influenza, High Dose Seasonal PF 09/30/2018  Influenza,inj,Quad PF,6+ Mos 11/19/2015, 07/15/2016   Influenza-Unspecified 06/27/2017   Moderna Sars-Covid-2 Vaccination 12/22/2019, 01/19/2020   PNEUMOCOCCAL CONJUGATE-20 06/11/2023    Pneumococcal Conjugate-13 09/15/2014   Pneumococcal Polysaccharide-23 06/02/2011   Tdap 06/02/2011   Zoster, Live 10/27/2006    Screening Tests Health Maintenance  Topic Date Due   Zoster Vaccines- Shingrix (1 of 2) 05/17/1964   DTaP/Tdap/Td (2 - Td or Tdap) 06/01/2021   OPHTHALMOLOGY EXAM  01/22/2022   INFLUENZA VACCINE  05/27/2024   Diabetic kidney evaluation - Urine ACR  06/10/2024   FOOT EXAM  06/10/2024   HEMOGLOBIN A1C  08/11/2024   MAMMOGRAM  09/13/2024   Diabetic kidney evaluation - eGFR measurement  10/11/2024   Medicare Annual Wellness (AWV)  02/10/2025   DEXA SCAN  05/15/2026   Colonoscopy  08/30/2028   Pneumonia Vaccine 50+ Years old  Completed   Hepatitis C Screening  Completed   HPV VACCINES  Aged Out   Meningococcal B Vaccine  Aged Out   COVID-19 Vaccine  Discontinued    Health Maintenance  Health Maintenance Due  Topic Date Due   Zoster Vaccines- Shingrix (1 of 2) 05/17/1964   DTaP/Tdap/Td (2 - Td or Tdap) 06/01/2021   OPHTHALMOLOGY EXAM  01/22/2022   Health Maintenance Items Addressed: Mammogram scheduled, See Nurse Notes  Additional Screening:  Vision Screening: Recommended annual ophthalmology exams for early detection of glaucoma and other disorders of the eye.  Dental Screening: Recommended annual dental exams for proper oral hygiene  Community Resource Referral / Chronic Care Management: CRR required this visit?  No   CCM required this visit?  No     Plan:     I have personally reviewed and noted the following in the patient's chart:   Medical and social history Use of alcohol, tobacco or illicit drugs  Current medications and supplements including opioid prescriptions. Patient is not currently taking opioid prescriptions. Functional ability and status Nutritional status Physical activity Advanced directives List of other physicians Hospitalizations, surgeries, and ER visits in previous 12 months Vitals Screenings to include  cognitive, depression, and falls Referrals and appointments  In addition, I have reviewed and discussed with patient certain preventive protocols, quality metrics, and best practice recommendations. A written personalized care plan for preventive services as well as general preventive health recommendations were provided to patient.     Kimberly Salazar, CMA   02/11/2024   After Visit Summary: (Mail) Due to this being a telephonic visit, the after visit summary with patients personalized plan was offered to patient via mail   Notes:  Placed order for [facility] (due ~09/13/24) Requested most recent results of DM eye exam Last colonoscopy 08/31/23 no repeat due to age per GI Needs Tdap and shingles vaccines (pharmacy) Declined DM & nutrition education Declined covid vaccines

## 2024-02-18 ENCOUNTER — Other Ambulatory Visit: Payer: Self-pay | Admitting: Internal Medicine

## 2024-02-18 DIAGNOSIS — I10 Essential (primary) hypertension: Secondary | ICD-10-CM

## 2024-02-19 NOTE — Telephone Encounter (Signed)
 Requested Prescriptions  Pending Prescriptions Disp Refills   glimepiride (AMARYL) 2 MG tablet [Pharmacy Med Name: GLIMEPIRIDE 2MG  TABLETS] 180 tablet 1    Sig: TAKE 1 TABLET BY MOUTH TWICE DAILY WITH FOOD     Endocrinology:  Diabetes - Sulfonylureas Passed - 02/19/2024  8:38 AM      Passed - HBA1C is between 0 and 7.9 and within 180 days    Hemoglobin A1C  Date Value Ref Range Status  02/10/2024 6.0 (A) 4.0 - 5.6 % Final   Hgb A1c MFr Bld  Date Value Ref Range Status  10/12/2023 6.6 (H) 4.8 - 5.6 % Final    Comment:             Prediabetes: 5.7 - 6.4          Diabetes: >6.4          Glycemic control for adults with diabetes: <7.0          Passed - Cr in normal range and within 360 days    Creatinine, Ser  Date Value Ref Range Status  10/12/2023 0.95 0.57 - 1.00 mg/dL Final         Passed - Valid encounter within last 6 months    Recent Outpatient Visits           1 week ago Type II diabetes mellitus with complication [facility])   [place] [facility] at [facility], Chales [place], MD       Future Appointments             In 3 months Gala Jubilee [place], MD [facility] Health Primary Care & Sports Medicine at [facility], [facility]             olmesartan -hydrochlorothiazide (BENICAR  HCT) 20-12.5 MG tablet [Pharmacy Med Name: OLMESARTAN  MEDOX/HCTZ 20-12.5MG  TAB] 90 tablet 0    Sig: TAKE 1 TABLET BY MOUTH DAILY     Cardiovascular: ARB + Diuretic Combos Passed - 02/19/2024  8:38 AM      Passed - K in normal range and within 180 days    Potassium  Date Value Ref Range Status  10/12/2023 5.0 3.5 - 5.2 mmol/L Final         Passed - Na in normal range and within 180 days    Sodium  Date Value Ref Range Status  10/12/2023 141 134 - 144 mmol/L Final         Passed - Cr in normal range and within 180 days    Creatinine, Ser  Date Value Ref Range Status  10/12/2023 0.95 0.57 - 1.00 mg/dL Final         Passed - eGFR is 10 or above and within 180  days    GFR calc Af Amer  Date Value Ref Range Status  09/13/2020 66 >59 mL/min/1.73 Final    Comment:    **In accordance with recommendations from the NKF-ASN Task force,**   [facility] is in the process of updating its eGFR calculation to the   2021 CKD-EPI creatinine equation that estimates kidney function   without a race variable.    GFR calc non Af Amer  Date Value Ref Range Status  09/13/2020 57 (L) >59 mL/min/1.73 Final   eGFR  Date Value Ref Range Status  10/12/2023 61 >59 mL/min/1.73 Final         Passed - Patient is not pregnant      Passed - Last BP in normal range    BP  Readings from Last 1 Encounters:  02/10/24 114/68         Passed - Valid encounter within last 6 months    Recent Outpatient Visits           1 week ago Type II diabetes mellitus with complication [facility])   [place] [facility] at [facility], Chales [place], MD       Future Appointments             In 3 months Gala Jubilee, [place], MD [facility] Health Primary Care & Sports Medicine at [facility], [facility]

## 2024-03-18 ENCOUNTER — Other Ambulatory Visit: Payer: Self-pay | Admitting: Internal Medicine

## 2024-03-18 DIAGNOSIS — E1169 Type 2 diabetes mellitus with other specified complication: Secondary | ICD-10-CM

## 2024-03-22 NOTE — Telephone Encounter (Signed)
 Requested Prescriptions  Pending Prescriptions Disp Refills   simvastatin  (ZOCOR ) 80 MG tablet [Pharmacy Med Name: SIMVASTATIN  80MG  TABLETS] 90 tablet 0    Sig: TAKE 1 TABLET(80 MG) BY MOUTH DAILY     Cardiovascular:  Antilipid - Statins Failed - 03/22/2024 12:09 PM      Failed - Lipid Panel in normal range within the last 12 months    Cholesterol, Total  Date Value Ref Range Status  06/11/2023 123 100 - 199 mg/dL Final   LDL Chol Calc (NIH)  Date Value Ref Range Status  06/11/2023 44 0 - 99 mg/dL Final   HDL  Date Value Ref Range Status  06/11/2023 51 >39 mg/dL Final   Triglycerides  Date Value Ref Range Status  06/11/2023 171 (H) 0 - 149 mg/dL Final         Passed - Patient is not pregnant      Passed - Valid encounter within last 12 months    Recent Outpatient Visits           1 month ago Type II diabetes mellitus with complication [facility])   [place] [facility] Medicine at [facility], Ch[place], MD       Future Appointments             In 2 months Sheron Dixons, MD [facility] Health Primary Care & Sports Medicine at [facility], [facility]             metoprolol succinate (TOPROL-XL) 25 MG 24 hr tablet [Pharmacy Med Name: METOPROLOL ER SUCCINATE 25MG  TABS] 90 tablet 0    Sig: TAKE 1 TABLET BY MOUTH EVERY DAY     Cardiovascular:  Beta Blockers Passed - 03/22/2024 12:09 PM      Passed - Last BP in normal range    BP Readings from Last 1 Encounters:  02/10/24 114/68         Passed - Last Heart Rate in normal range    Pulse Readings from Last 1 Encounters:  02/10/24 65         Passed - Valid encounter within last 6 months    Recent Outpatient Visits           1 month ago Type II diabetes mellitus with complication [facility])   [place] [facility] Medicine at [facility], Chales [place], MD       Future Appointments             In 2 months Gala Jubilee, [place], MD [facility] Health Primary Care & Sports Medicine at  [facility], [facility]

## 2024-05-03 ENCOUNTER — Other Ambulatory Visit: Payer: Self-pay | Admitting: Internal Medicine

## 2024-05-03 DIAGNOSIS — E118 Type 2 diabetes mellitus with unspecified complications: Secondary | ICD-10-CM

## 2024-05-05 NOTE — Telephone Encounter (Signed)
 Requested Prescriptions  Pending Prescriptions Disp Refills   Semaglutide ,0.25 or 0.5MG /DOS, (OZEMPIC , 0.25 OR 0.5 MG/DOSE,) 2 MG/3ML SOPN [Pharmacy Med Name: OZEMPIC  0.25 OR 0.5MG  DOS(2MG /3ML)] 3 mL 1    Sig: INJECT 0.5 MG INTO THE SKIN ONE DAY A WEEK     Endocrinology:  Diabetes - GLP-1 Receptor Agonists - semaglutide  Failed - 05/05/2024  1:57 PM      Failed - HBA1C in normal range and within 180 days    Hemoglobin A1C  Date Value Ref Range Status  02/10/2024 6.0 (A) 4.0 - 5.6 % Final   Hgb A1c MFr Bld  Date Value Ref Range Status  10/12/2023 6.6 (H) 4.8 - 5.6 % Final    Comment:             Prediabetes: 5.7 - 6.4          Diabetes: >6.4          Glycemic control for adults with diabetes: <7.0          Passed - Cr in normal range and within 360 days    Creatinine, Ser  Date Value Ref Range Status  10/12/2023 0.95 0.57 - 1.00 mg/dL Final         Passed - Valid encounter within last 6 months    Recent Outpatient Visits           2 months ago Type II diabetes mellitus with complication [facility])   [place] [facility] Care & Sports Medicine at [facility], Leita DEL, MD       Future Appointments             In 1 month Justus Leita DEL, MD [facility] Health Primary Care & Sports Medicine at [facility], [facility]

## 2024-05-18 ENCOUNTER — Other Ambulatory Visit: Payer: Self-pay | Admitting: Internal Medicine

## 2024-05-18 DIAGNOSIS — E1169 Type 2 diabetes mellitus with other specified complication: Secondary | ICD-10-CM

## 2024-05-19 NOTE — Telephone Encounter (Signed)
 Rx 03/22/24 #90 for both- too soon Requested Prescriptions  Pending Prescriptions Disp Refills   simvastatin  (ZOCOR ) 80 MG tablet [Pharmacy Med Name: SIMVASTATIN  80MG  TABLETS] 90 tablet 0    Sig: TAKE 1 TABLET(80 MG) BY MOUTH DAILY     Cardiovascular:  Antilipid - Statins Failed - 05/19/2024  4:19 PM      Failed - Lipid Panel in normal range within the last 12 months    Cholesterol, Total  Date Value Ref Range Status  06/11/2023 123 100 - 199 mg/dL Final   LDL Chol Calc (NIH)  Date Value Ref Range Status  06/11/2023 44 0 - 99 mg/dL Final   HDL  Date Value Ref Range Status  06/11/2023 51 >39 mg/dL Final   Triglycerides  Date Value Ref Range Status  06/11/2023 171 (H) 0 - 149 mg/dL Final         Passed - Patient is not pregnant      Passed - Valid encounter within last 12 months    Recent Outpatient Visits           3 months ago Type II diabetes mellitus with complication [facility])   [place] [facility] Medicine at [facility], Leita DEL, MD       Future Appointments             In 4 weeks Justus Leita DEL, MD [facility] Health Primary Care & Sports Medicine at [facility], [facility]             metoprolol succinate (TOPROL-XL) 25 MG 24 hr tablet [Pharmacy Med Name: METOPROLOL ER SUCCINATE 25MG  TABS] 90 tablet 0    Sig: TAKE 1 TABLET BY MOUTH EVERY DAY     Cardiovascular:  Beta Blockers Passed - 05/19/2024  4:19 PM      Passed - Last BP in normal range    BP Readings from Last 1 Encounters:  02/10/24 114/68         Passed - Last Heart Rate in normal range    Pulse Readings from Last 1 Encounters:  02/10/24 65         Passed - Valid encounter within last 6 months    Recent Outpatient Visits           3 months ago Type II diabetes mellitus with complication [facility])   [place] [facility] Care & Sports Medicine at [facility], Leita DEL, MD       Future Appointments             In 4 weeks Justus Leita DEL, MD [facility] Health  Primary Care & Sports Medicine at [facility], [facility]

## 2024-05-23 ENCOUNTER — Other Ambulatory Visit: Payer: Self-pay | Admitting: Internal Medicine

## 2024-05-23 DIAGNOSIS — I10 Essential (primary) hypertension: Secondary | ICD-10-CM

## 2024-05-24 NOTE — Telephone Encounter (Signed)
 Requested medication (s) are due for refill today: yes  Requested medication (s) are on the active medication list: yes  Last refill:  02/19/24 #90   Future visit scheduled: yes  Notes to clinic:  overdue lab work    Requested Prescriptions  Pending Prescriptions Disp Refills   olmesartan -hydrochlorothiazide (BENICAR  HCT) 20-12.5 MG tablet [Pharmacy Med Name: OLMESARTAN  MEDOX/HCTZ 20-12.5MG  TAB] 90 tablet     Sig: TAKE 1 TABLET BY MOUTH DAILY     Cardiovascular: ARB + Diuretic Combos Failed - 05/24/2024 12:09 PM      Failed - K in normal range and within 180 days    Potassium  Date Value Ref Range Status  10/12/2023 5.0 3.5 - 5.2 mmol/L Final         Failed - Na in normal range and within 180 days    Sodium  Date Value Ref Range Status  10/12/2023 141 134 - 144 mmol/L Final         Failed - Cr in normal range and within 180 days    Creatinine, Ser  Date Value Ref Range Status  10/12/2023 0.95 0.57 - 1.00 mg/dL Final         Failed - eGFR is 10 or above and within 180 days    GFR calc Af Amer  Date Value Ref Range Status  09/13/2020 66 >59 mL/min/1.73 Final    Comment:    **In accordance with recommendations from the NKF-ASN Task force,**   [facility] is in the process of updating its eGFR calculation to the   2021 CKD-EPI creatinine equation that estimates kidney function   without a race variable.    GFR calc non Af Amer  Date Value Ref Range Status  09/13/2020 57 (L) >59 mL/min/1.73 Final   eGFR  Date Value Ref Range Status  10/12/2023 61 >59 mL/min/1.73 Final         Passed - Patient is not pregnant      Passed - Last BP in normal range    BP Readings from Last 1 Encounters:  02/10/24 114/68         Passed - Valid encounter within last 6 months    Recent Outpatient Visits           3 months ago Type II diabetes mellitus with complication [facility])   [place] [facility] at [facility], Leita DEL, MD       Future  Appointments             In 3 weeks Justus Leita DEL, MD [facility] at [facility], [facility]            Signed Prescriptions Disp Refills   omega-3 acid ethyl esters (LOVAZA) 1 g capsule 360 capsule 0    Sig: TAKE 4 CAPSULES BY MOUTH EVERY DAY     Endocrinology:  Nutritional Agents - omega-3 acid ethyl esters Failed - 05/24/2024 12:09 PM      Failed - Lipid Panel in normal range within the last 12 months    Cholesterol, Total  Date Value Ref Range Status  06/11/2023 123 100 - 199 mg/dL Final   LDL Chol Calc (NIH)  Date Value Ref Range Status  06/11/2023 44 0 - 99 mg/dL Final   HDL  Date Value Ref Range Status  06/11/2023 51 >39 mg/dL Final   Triglycerides  Date Value Ref Range Status  06/11/2023 171 (H) 0 - 149 mg/dL Final  Passed - Valid encounter within last 12 months    Recent Outpatient Visits           3 months ago Type II diabetes mellitus with complication [facility])   [place] [facility] at [facility], Leita DEL, MD       Future Appointments             In 3 weeks Justus Leita DEL, MD [facility] at [facility], [facility]

## 2024-05-24 NOTE — Telephone Encounter (Signed)
 Requested Prescriptions  Pending Prescriptions Disp Refills   olmesartan -hydrochlorothiazide (BENICAR  HCT) 20-12.5 MG tablet [Pharmacy Med Name: OLMESARTAN  MEDOX/HCTZ 20-12.5MG  TAB] 90 tablet     Sig: TAKE 1 TABLET BY MOUTH DAILY     Cardiovascular: ARB + Diuretic Combos Failed - 05/24/2024 12:09 PM      Failed - K in normal range and within 180 days    Potassium  Date Value Ref Range Status  10/12/2023 5.0 3.5 - 5.2 mmol/L Final         Failed - Na in normal range and within 180 days    Sodium  Date Value Ref Range Status  10/12/2023 141 134 - 144 mmol/L Final         Failed - Cr in normal range and within 180 days    Creatinine, Ser  Date Value Ref Range Status  10/12/2023 0.95 0.57 - 1.00 mg/dL Final         Failed - eGFR is 10 or above and within 180 days    GFR calc Af Amer  Date Value Ref Range Status  09/13/2020 66 >59 mL/min/1.73 Final    Comment:    **In accordance with recommendations from the NKF-ASN Task force,**   [facility] is in the process of updating its eGFR calculation to the   2021 CKD-EPI creatinine equation that estimates kidney function   without a race variable.    GFR calc non Af Amer  Date Value Ref Range Status  09/13/2020 57 (L) >59 mL/min/1.73 Final   eGFR  Date Value Ref Range Status  10/12/2023 61 >59 mL/min/1.73 Final         Passed - Patient is not pregnant      Passed - Last BP in normal range    BP Readings from Last 1 Encounters:  02/10/24 114/68         Passed - Valid encounter within last 6 months    Recent Outpatient Visits           3 months ago Type II diabetes mellitus with complication [facility])   [place] [facility] at [facility], Leita DEL, MD       Future Appointments             In 3 weeks Justus Leita DEL, MD [facility] at [facility], [facility]             omega-3 acid ethyl esters (LOVAZA) 1 g capsule [Pharmacy Med Name: OMEGA-3-ACID 1GM  CAPSULES (RX)] 360 capsule 0    Sig: TAKE 4 CAPSULES BY MOUTH EVERY DAY     Endocrinology:  Nutritional Agents - omega-3 acid ethyl esters Failed - 05/24/2024 12:09 PM      Failed - Lipid Panel in normal range within the last 12 months    Cholesterol, Total  Date Value Ref Range Status  06/11/2023 123 100 - 199 mg/dL Final   LDL Chol Calc (NIH)  Date Value Ref Range Status  06/11/2023 44 0 - 99 mg/dL Final   HDL  Date Value Ref Range Status  06/11/2023 51 >39 mg/dL Final   Triglycerides  Date Value Ref Range Status  06/11/2023 171 (H) 0 - 149 mg/dL Final         Passed - Valid encounter within last 12 months    Recent Outpatient Visits           3 months ago Type II diabetes mellitus with complication (HCC)  [facility] at [facility], Leita DEL, MD       Future Appointments             In 3 weeks Justus Leita DEL, MD [facility] Medicine at [facility], [facility]

## 2024-06-16 ENCOUNTER — Encounter: Payer: Self-pay | Admitting: Internal Medicine

## 2024-06-16 ENCOUNTER — Ambulatory Visit (INDEPENDENT_AMBULATORY_CARE_PROVIDER_SITE_OTHER): Admitting: Internal Medicine

## 2024-06-16 ENCOUNTER — Other Ambulatory Visit: Payer: Self-pay | Admitting: Internal Medicine

## 2024-06-16 VITALS — BP 122/66 | HR 79 | Ht 68.0 in | Wt 176.0 lb

## 2024-06-16 DIAGNOSIS — I10 Essential (primary) hypertension: Secondary | ICD-10-CM

## 2024-06-16 DIAGNOSIS — E1169 Type 2 diabetes mellitus with other specified complication: Secondary | ICD-10-CM | POA: Diagnosis not present

## 2024-06-16 DIAGNOSIS — Z1231 Encounter for screening mammogram for malignant neoplasm of breast: Secondary | ICD-10-CM

## 2024-06-16 DIAGNOSIS — E118 Type 2 diabetes mellitus with unspecified complications: Secondary | ICD-10-CM | POA: Diagnosis not present

## 2024-06-16 DIAGNOSIS — Z8542 Personal history of malignant neoplasm of other parts of uterus: Secondary | ICD-10-CM

## 2024-06-16 DIAGNOSIS — I495 Sick sinus syndrome: Secondary | ICD-10-CM

## 2024-06-16 DIAGNOSIS — Z7984 Long term (current) use of oral hypoglycemic drugs: Secondary | ICD-10-CM

## 2024-06-16 DIAGNOSIS — E785 Hyperlipidemia, unspecified: Secondary | ICD-10-CM

## 2024-06-16 MED ORDER — SIMVASTATIN 80 MG PO TABS: ORAL_TABLET | ORAL | 1 refills | Status: DC

## 2024-06-16 MED ORDER — METFORMIN HCL ER 500 MG PO TB24: 1000.0000 mg | ORAL_TABLET | Freq: Two times a day (BID) | ORAL | 1 refills | Status: AC

## 2024-06-16 MED ORDER — METOPROLOL SUCCINATE ER 25 MG PO TB24: 25.0000 mg | ORAL_TABLET | Freq: Every day | ORAL | 1 refills | Status: DC

## 2024-06-16 NOTE — Assessment & Plan Note (Signed)
 Doing well, rate controlled on BB; pacemaker in place No symptoms noted - good exercise tolerance

## 2024-06-16 NOTE — Assessment & Plan Note (Signed)
 Blood sugars have been stable.  No hypoglycemic events since last visit.  She would like to reduce the dose of glimepiride if A1C is good. Currently medications are MTF, glimepiride and Ozempic . Last visit medical regimen changes were none. Lab Results  Component Value Date   HGBA1C 6.0 (A) 02/10/2024

## 2024-06-16 NOTE — Progress Notes (Signed)
 Date:  06/16/2024   Name:  Kimberly Salazar   DOB:  [birth year]   MRN:  [id number]   Chief Complaint: Annual Exam Kimberly Salazar is a 78 y.o. female who presents today for her Complete Annual Exam. She feels well. She reports exercising- none. She reports she is sleeping well. Breast complaints - none.  Health Maintenance  Topic Date Due   Zoster (Shingles) Vaccine (1 of 2) 05[birth year]17/1965   DTaP/Tdap/Td vaccine (2 - Td or Tdap) 06/01/2021   Eye exam for diabetics  01/22/2022   Yearly kidney health urinalysis for diabetes  06/10/2024   Flu Shot  01/24/2025*   Hemoglobin A1C  08/11/2024   Mammogram  09/13/2024   Yearly kidney function blood test for diabetes  10/11/2024   Medicare Annual Wellness Visit  02/10/2025   Complete foot exam   06/16/2025   DEXA scan (bone density measurement)  05/15/2026   Colon Cancer Screening  08/30/2028   Pneumococcal Vaccine for age over 61  Completed   Hepatitis C Screening  Completed   HPV Vaccine  Aged Out   Meningitis B Vaccine  Aged Out   COVID-19 Vaccine  Discontinued  *Topic was postponed. The date shown is not the original due date.    Hypertension This is a chronic problem. The problem is controlled. Pertinent negatives include no chest pain, headaches, palpitations or shortness of breath. Past treatments include beta blockers, angiotensin blockers and diuretics. The current treatment provides significant improvement. Hypertensive end-organ damage includes CAD/MI. There is no history of kidney disease or CVA.  Diabetes She presents for her follow-up diabetic visit. She has type 2 diabetes mellitus. Her disease course has been stable. Pertinent negatives for hypoglycemia include no dizziness or headaches. Pertinent negatives for diabetes include no chest pain, no fatigue and no weakness. Pertinent negatives for diabetic complications include no CVA. Current diabetic treatments: MTF, Ozempic  and glimepiride. She is compliant with treatment all of the  time. There is no change in her home blood glucose trend. An ACE inhibitor/angiotensin II receptor blocker is being taken.  Hyperlipidemia This is a chronic problem. The problem is controlled. Pertinent negatives include no chest pain, myalgias or shortness of breath. Current antihyperlipidemic treatment includes statins and fibric acid derivatives.    Review of Systems  Constitutional:  Negative for fatigue and unexpected weight change.  HENT:  Negative for trouble swallowing.   Eyes:  Negative for visual disturbance.  Respiratory:  Negative for cough, chest tightness, shortness of breath and wheezing.   Cardiovascular:  Negative for chest pain, palpitations and leg swelling.  Gastrointestinal:  Negative for abdominal pain, constipation and diarrhea.  Musculoskeletal:  Negative for arthralgias and myalgias.  Neurological:  Negative for dizziness, weakness, light-headedness and headaches.     Lab Results  Component Value Date   NA 141 10/12/2023   K 5.0 10/12/2023   CO2 20 10/12/2023   GLUCOSE 198 (H) 10/12/2023   BUN 19 10/12/2023   CREATININE 0.95 10/12/2023   CALCIUM 10.0 10/12/2023   EGFR 61 10/12/2023   GFRNONAA 57 (L) 09/13/2020   Lab Results  Component Value Date   CHOL 123 06/11/2023   HDL 51 06/11/2023   LDLCALC 44 06/11/2023   TRIG 171 (H) 06/11/2023   CHOLHDL 2.4 06/11/2023   Lab Results  Component Value Date   TSH 2.240 06/11/2023   Lab Results  Component Value Date   HGBA1C 6.0 (A) 02/10/2024   Lab Results  Component Value Date   WBC 7.4  06/11/2023   HGB 13.7 06/11/2023   HCT 40.5 06/11/2023   MCV 91 06/11/2023   PLT 181 06/11/2023   Lab Results  Component Value Date   ALT 25 06/11/2023   AST 25 06/11/2023   ALKPHOS 54 06/11/2023   BILITOT 0.6 06/11/2023   No results found for: MARIEN BOLLS, VD25OH   Patient Active Problem List   Diagnosis Date Noted   Sick sinus syndrome (HCC) 02/09/2023   Ovarian failure 04/09/2017   Basal  cell carcinoma, leg 07/15/2016   Abnormal finding on thallium stress test 05/17/2015   Hyperlipidemia associated with type 2 diabetes mellitus (HCC) 04/12/2015   Essential (primary) hypertension 04/12/2015   History of uterine cancer 04/12/2015   Type II diabetes mellitus with complication (HCC) 04/12/2015   Cardiac pacemaker in situ 10/11/2009    No Known Allergies  Past Surgical History:  Procedure Laterality Date   ABDOMINAL HYSTERECTOMY  10/27/1992   Uterine CA   APPENDECTOMY     cataract Right 04/2023   CATARACT EXTRACTION Left 10/27/2009   LITHOTRIPSY  10/28/2003   PACEMAKER INSERTION  10/27/2008    Social History   Tobacco Use   Smoking status: Never    Passive exposure: Never   Smokeless tobacco: Never  Vaping Use   Vaping status: Never Used  Substance Use Topics   Alcohol use: No    Alcohol/week: 0.0 standard drinks of alcohol   Drug use: No     Medication list has been reviewed and updated.  Current Meds  Medication Sig   aspirin 81 MG chewable tablet Chew 1 tablet by mouth daily.   cholecalciferol (VITAMIN D3) 25 MCG (1000 UT) tablet Take 1,000 Units by mouth daily.   glimepiride (AMARYL) 2 MG tablet TAKE 1 TABLET BY MOUTH TWICE DAILY WITH FOOD   glucose blood (ONE TOUCH ULTRA TEST) test strip 1 each by Other route 2 (two) times daily.   Multiple Vitamins-Minerals (MULTIVITAL) tablet Take 1 tablet by mouth daily.   olmesartan -hydrochlorothiazide (BENICAR  HCT) 20-12.5 MG tablet TAKE 1 TABLET BY MOUTH DAILY   omega-3 acid ethyl esters (LOVAZA) 1 g capsule TAKE 4 CAPSULES BY MOUTH EVERY DAY   Semaglutide ,0.25 or 0.5MG /DOS, (OZEMPIC , 0.25 OR 0.5 MG/DOSE,) 2 MG/3ML SOPN INJECT 0.5 MG INTO THE SKIN ONE DAY A WEEK   [DISCONTINUED] metFORMIN  (GLUCOPHAGE -XR) 500 MG 24 hr tablet TAKE 2 TABLETS(1000 MG) BY MOUTH IN THE MORNING AND AT BEDTIME   [DISCONTINUED] metoprolol  succinate (TOPROL -XL) 25 MG 24 hr tablet TAKE 1 TABLET BY MOUTH EVERY DAY   [DISCONTINUED]  simvastatin  (ZOCOR ) 80 MG tablet TAKE 1 TABLET(80 MG) BY MOUTH DAILY       06/16/2024    8:36 AM 02/10/2024    9:49 AM 10/12/2023   11:04 AM 06/11/2023    9:31 AM  GAD 7 : Generalized Anxiety Score  Nervous, Anxious, on Edge 0 0 0 0  Control/stop worrying 0 0 0 0  Worry too much - different things 0 0 0 0  Trouble relaxing 0 0 0 0  Restless 0 0 0 0  Easily annoyed or irritable 0 0 0 0  Afraid - awful might happen 0 0 0 0  Total GAD 7 Score 0 0 0 0  Anxiety Difficulty Not difficult at all Not difficult at all Not difficult at all Not difficult at all       06/16/2024    8:36 AM 02/11/2024    3:19 PM 02/10/2024    9:48 AM  Depression screen  PHQ 2/9  Decreased Interest 0 0 0  Down, Depressed, Hopeless 0 0 0  PHQ - 2 Score 0 0 0  Altered sleeping 0 0 0  Tired, decreased energy 0 0 0  Change in appetite 0 0 0  Feeling bad or failure about yourself  0 0 0  Trouble concentrating 0 0 0  Moving slowly or fidgety/restless 0 0 0  Suicidal thoughts 0 0 0  PHQ-9 Score 0 0 0  Difficult doing work/chores Not difficult at all Not difficult at all Not difficult at all    BP Readings from Last 3 Encounters:  06/16/24 122/66  02/10/24 114/68  10/12/23 126/70    Physical Exam Vitals and nursing note reviewed.  Constitutional:      General: She is not in acute distress.    Appearance: She is well-developed.  HENT:     Head: Normocephalic and atraumatic.     Right Ear: Tympanic membrane and ear canal normal.     Left Ear: Tympanic membrane and ear canal normal.     Nose:     Right Sinus: No maxillary sinus tenderness.     Left Sinus: No maxillary sinus tenderness.  Eyes:     General: No scleral icterus.       Right eye: No discharge.        Left eye: No discharge.     Conjunctiva/sclera: Conjunctivae normal.  Neck:     Thyroid : No thyromegaly.     Vascular: No carotid bruit.  Cardiovascular:     Rate and Rhythm: Normal rate and regular rhythm.     Pulses: Normal pulses.      Heart sounds: Normal heart sounds.  Pulmonary:     Effort: Pulmonary effort is normal. No respiratory distress.     Breath sounds: No wheezing.  Chest:    Abdominal:     General: Bowel sounds are normal.     Palpations: Abdomen is soft.     Tenderness: There is no abdominal tenderness.  Musculoskeletal:     Cervical back: Normal range of motion. No erythema.     Right lower leg: No edema.     Left lower leg: No edema.  Lymphadenopathy:     Cervical: No cervical adenopathy.  Skin:    General: Skin is warm and dry.     Findings: Lesion present. No rash.      Neurological:     Mental Status: She is alert and oriented to person, place, and time.     Cranial Nerves: No cranial nerve deficit.     Sensory: No sensory deficit.     Deep Tendon Reflexes: Reflexes are normal and symmetric.  Psychiatric:        Attention and Perception: Attention normal.        Mood and Affect: Mood normal.    Diabetic Foot Exam - Simple   Simple Foot Form Diabetic Foot exam was performed with the following findings: Yes 06/16/2024  9:00 AM  Visual Inspection No deformities, no ulcerations, no other skin breakdown bilaterally: Yes Sensation Testing Intact to touch and monofilament testing bilaterally: Yes Pulse Check Posterior Tibialis and Dorsalis pulse intact bilaterally: Yes Comments      Wt Readings from Last 3 Encounters:  06/16/24 176 lb (79.8 kg)  02/11/24 176 lb (79.8 kg)  02/10/24 176 lb 6 oz (80 kg)    BP 122/66   Pulse 79   Ht 5' 8 (1.727 m)   Wt 176 lb (79.8 kg)  SpO2 97%   BMI 26.76 kg/m   Assessment and Plan:  Problem List Items Addressed This Visit       Unprioritized   Hyperlipidemia associated with type 2 diabetes mellitus (HCC) (Chronic)   LDL is  Lab Results  Component Value Date   LDLCALC 44 06/11/2023   Currently taking simvastatin .  No medication side effects or other concerns. Recommended LDL goal is < 70.       Relevant Medications   metoprolol   succinate (TOPROL -XL) 25 MG 24 hr tablet   simvastatin  (ZOCOR ) 80 MG tablet   metFORMIN  (GLUCOPHAGE -XR) 500 MG 24 hr tablet   Other Relevant Orders   Comprehensive metabolic panel with GFR   Lipid panel   Essential (primary) hypertension - Primary (Chronic)   Blood pressure is well controlled on metoprolol , olmesartan  and hydrochlorothiazide. No medication side effects noted. Plan to continue current medications. She sees Cardiology regularly.       Relevant Medications   metoprolol  succinate (TOPROL -XL) 25 MG 24 hr tablet   simvastatin  (ZOCOR ) 80 MG tablet   Other Relevant Orders   CBC with Differential/Platelet   Comprehensive metabolic panel with GFR   TSH   History of uterine cancer   Type II diabetes mellitus with complication (HCC) (Chronic)   Blood sugars have been stable.  No hypoglycemic events since last visit.  She would like to reduce the dose of glimepiride if A1C is good. Currently medications are MTF, glimepiride and Ozempic . Last visit medical regimen changes were none. Lab Results  Component Value Date   HGBA1C 6.0 (A) 02/10/2024          Relevant Medications   simvastatin  (ZOCOR ) 80 MG tablet   metFORMIN  (GLUCOPHAGE -XR) 500 MG 24 hr tablet   Other Relevant Orders   Hemoglobin A1c   Microalbumin / creatinine urine ratio   Sick sinus syndrome (HCC)   Doing well, rate controlled on BB; pacemaker in place No symptoms noted - good exercise tolerance      Relevant Medications   metoprolol  succinate (TOPROL -XL) 25 MG 24 hr tablet   simvastatin  (ZOCOR ) 80 MG tablet   Other Relevant Orders   TSH   Other Visit Diagnoses       Long term current use of oral hypoglycemic drug         Encounter for screening mammogram for breast cancer       due this November at [facility]       Return in about 4 months (around 10/16/2024) for HTN, DM.    Leita HILARIO Adie, MD [facility] [place]

## 2024-06-16 NOTE — Patient Instructions (Signed)
 Call [facility] to schedule your mammogram at [phone].

## 2024-06-16 NOTE — Assessment & Plan Note (Signed)
 Blood pressure is well controlled on metoprolol , olmesartan  and hydrochlorothiazide. No medication side effects noted. Plan to continue current medications. She sees Cardiology regularly.

## 2024-06-16 NOTE — Assessment & Plan Note (Signed)
 LDL is  Lab Results  Component Value Date   LDLCALC 44 06/11/2023   Currently taking simvastatin .  No medication side effects or other concerns. Recommended LDL goal is < 70.

## 2024-06-18 LAB — COMPREHENSIVE METABOLIC PANEL WITH GFR
ALT: 17 IU/L (ref 0–32)
AST: 26 IU/L (ref 0–40)
Albumin: 4.6 g/dL (ref 3.8–4.8)
Alkaline Phosphatase: 61 IU/L (ref 44–121)
BUN/Creatinine Ratio: 23 (ref 12–28)
BUN: 25 mg/dL (ref 8–27)
Bilirubin Total: 0.6 mg/dL (ref 0.0–1.2)
CO2: 19 mmol/L — ABNORMAL LOW (ref 20–29)
Calcium: 10.3 mg/dL (ref 8.7–10.3)
Chloride: 101 mmol/L (ref 96–106)
Creatinine, Ser: 1.09 mg/dL — ABNORMAL HIGH (ref 0.57–1.00)
Globulin, Total: 2.4 g/dL (ref 1.5–4.5)
Glucose: 184 mg/dL — ABNORMAL HIGH (ref 70–99)
Potassium: 4.7 mmol/L (ref 3.5–5.2)
Sodium: 139 mmol/L (ref 134–144)
Total Protein: 7 g/dL (ref 6.0–8.5)
eGFR: 52 mL/min/1.73 — ABNORMAL LOW (ref >59)

## 2024-06-18 LAB — LIPID PANEL
Chol/HDL Ratio: 2.3 ratio (ref 0.0–4.4)
Cholesterol, Total: 131 mg/dL (ref 100–199)
HDL: 56 mg/dL (ref >39)
LDL Chol Calc (NIH): 49 mg/dL (ref 0–99)
Triglycerides: 156 mg/dL — ABNORMAL HIGH (ref 0–149)
VLDL Cholesterol Cal: 26 mg/dL (ref 5–40)

## 2024-06-18 LAB — CBC WITH DIFFERENTIAL/PLATELET
Basophils Absolute: 0.1 x10E3/uL (ref 0.0–0.2)
Basos: 1 %
EOS (ABSOLUTE): 0.1 x10E3/uL (ref 0.0–0.4)
Eos: 1 %
Hematocrit: 42 % (ref 34.0–46.6)
Hemoglobin: 13.7 g/dL (ref 11.1–15.9)
Immature Grans (Abs): 0 x10E3/uL (ref 0.0–0.1)
Immature Granulocytes: 0 %
Lymphocytes Absolute: 1.3 x10E3/uL (ref 0.7–3.1)
Lymphs: 20 %
MCH: 30.7 pg (ref 26.6–33.0)
MCHC: 32.6 g/dL (ref 31.5–35.7)
MCV: 94 fL (ref 79–97)
Monocytes Absolute: 0.3 x10E3/uL (ref 0.1–0.9)
Monocytes: 5 %
Neutrophils Absolute: 4.9 x10E3/uL (ref 1.4–7.0)
Neutrophils: 73 %
Platelets: 173 x10E3/uL (ref 150–450)
RBC: 4.46 x10E6/uL (ref 3.77–5.28)
RDW: 12.2 % (ref 11.7–15.4)
WBC: 6.7 x10E3/uL (ref 3.4–10.8)

## 2024-06-18 LAB — HEMOGLOBIN A1C
Est. average glucose Bld gHb Est-mCnc: 123 mg/dL
Hgb A1c MFr Bld: 5.9 % — ABNORMAL HIGH (ref 4.8–5.6)

## 2024-06-18 LAB — TSH: TSH: 3.39 u[IU]/mL (ref 0.450–4.500)

## 2024-06-18 LAB — MICROALBUMIN / CREATININE URINE RATIO
Creatinine, Urine: 184.6 mg/dL
Microalb/Creat Ratio: 10 mg/g{creat} (ref 0–29)
Microalbumin, Urine: 19.1 ug/mL

## 2024-06-20 ENCOUNTER — Ambulatory Visit: Payer: Self-pay | Admitting: Internal Medicine

## 2024-07-01 ENCOUNTER — Other Ambulatory Visit: Payer: Self-pay | Admitting: Internal Medicine

## 2024-07-01 DIAGNOSIS — E118 Type 2 diabetes mellitus with unspecified complications: Secondary | ICD-10-CM

## 2024-07-01 NOTE — Telephone Encounter (Signed)
 Requested Prescriptions  Pending Prescriptions Disp Refills   Semaglutide ,0.25 or 0.5MG /DOS, (OZEMPIC , 0.25 OR 0.5 MG/DOSE,) 2 MG/3ML SOPN [Pharmacy Med Name: OZEMPIC  0.25 OR 0.5MG  DOS(2MG /3ML)] 3 mL 2    Sig: INJECT 0.5 MG INTO THE SKIN ONE DAY A WEEK     Endocrinology:  Diabetes - GLP-1 Receptor Agonists - semaglutide  Failed - 07/01/2024  5:52 PM      Failed - HBA1C in normal range and within 180 days    Hgb A1c MFr Bld  Date Value Ref Range Status  06/16/2024 5.9 (H) 4.8 - 5.6 % Final    Comment:             Prediabetes: 5.7 - 6.4          Diabetes: >6.4          Glycemic control for adults with diabetes: <7.0          Failed - Cr in normal range and within 360 days    Creatinine, Ser  Date Value Ref Range Status  06/16/2024 1.09 (H) 0.57 - 1.00 mg/dL Final         Passed - Valid encounter within last 6 months    Recent Outpatient Visits           2 weeks ago Essential (primary) hypertension   [place] [facility] at [facility], Leita DEL, MD   4 months ago Type II diabetes mellitus with complication [facility])   [place] [facility] at [facility], Leita DEL, MD

## 2024-08-25 ENCOUNTER — Other Ambulatory Visit: Payer: Self-pay | Admitting: Internal Medicine

## 2024-08-25 DIAGNOSIS — I10 Essential (primary) hypertension: Secondary | ICD-10-CM

## 2024-08-26 NOTE — Telephone Encounter (Signed)
 Requested Prescriptions  Pending Prescriptions Disp Refills   glimepiride (AMARYL) 2 MG tablet [Pharmacy Med Name: GLIMEPIRIDE 2MG  TABLETS] 180 tablet 0    Sig: TAKE 1 TABLET BY MOUTH TWICE DAILY WITH FOOD     Endocrinology:  Diabetes - Sulfonylureas Failed - 08/26/2024  3:44 PM      Failed - Cr in normal range and within 360 days    Creatinine, Ser  Date Value Ref Range Status  06/16/2024 1.09 (H) 0.57 - 1.00 mg/dL Final         Passed - HBA1C is between 0 and 7.9 and within 180 days    Hgb A1c MFr Bld  Date Value Ref Range Status  06/16/2024 5.9 (H) 4.8 - 5.6 % Final    Comment:             Prediabetes: 5.7 - 6.4          Diabetes: >6.4          Glycemic control for adults with diabetes: <7.0          Passed - Valid encounter within last 6 months    Recent Outpatient Visits           2 months ago Essential (primary) hypertension   [place] [facility] at [facility], Leita DEL, MD   6 months ago Type II diabetes mellitus with complication [facility])   [place] [facility] at [facility], Leita DEL, MD               olmesartan -hydrochlorothiazide (BENICAR  HCT) 20-12.5 MG tablet [Pharmacy Med Name: OLMESARTAN  MEDOX/HCTZ 20-12.5MG  TAB] 90 tablet 0    Sig: TAKE 1 TABLET BY MOUTH DAILY     Cardiovascular: ARB + Diuretic Combos Failed - 08/26/2024  3:44 PM      Failed - Cr in normal range and within 180 days    Creatinine, Ser  Date Value Ref Range Status  06/16/2024 1.09 (H) 0.57 - 1.00 mg/dL Final         Passed - K in normal range and within 180 days    Potassium  Date Value Ref Range Status  06/16/2024 4.7 3.5 - 5.2 mmol/L Final         Passed - Na in normal range and within 180 days    Sodium  Date Value Ref Range Status  06/16/2024 139 134 - 144 mmol/L Final         Passed - eGFR is 10 or above and within 180 days    GFR calc Af Amer  Date Value Ref Range Status  09/13/2020 66 >59  mL/min/1.73 Final    Comment:    **In accordance with recommendations from the NKF-ASN Task force,**   [facility] is in the process of updating its eGFR calculation to the   2021 CKD-EPI creatinine equation that estimates kidney function   without a race variable.    GFR calc non Af Amer  Date Value Ref Range Status  09/13/2020 57 (L) >59 mL/min/1.73 Final   eGFR  Date Value Ref Range Status  06/16/2024 52 (L) >59 mL/min/1.73 Final         Passed - Patient is not pregnant      Passed - Last BP in normal range    BP Readings from Last 1 Encounters:  06/16/24 122/66         Passed - Valid encounter within last 6 months    Recent  Outpatient Visits           2 months ago Essential (primary) hypertension   [place] [facility] at [facility], Leita DEL, MD   6 months ago Type II diabetes mellitus with complication [facility])   [place] [facility] at [facility], Leita DEL, MD               omega-3 acid ethyl esters (LOVAZA) 1 g capsule [Pharmacy Med Name: OMEGA-3-ACID 1GM CAPSULES (RX)] 360 capsule 0    Sig: TAKE 4 CAPSULES BY MOUTH EVERY DAY     Endocrinology:  Nutritional Agents - omega-3 acid ethyl esters Failed - 08/26/2024  3:44 PM      Failed - Lipid Panel in normal range within the last 12 months    Cholesterol, Total  Date Value Ref Range Status  06/16/2024 131 100 - 199 mg/dL Final   LDL Chol Calc (NIH)  Date Value Ref Range Status  06/16/2024 49 0 - 99 mg/dL Final   HDL  Date Value Ref Range Status  06/16/2024 56 >39 mg/dL Final   Triglycerides  Date Value Ref Range Status  06/16/2024 156 (H) 0 - 149 mg/dL Final         Passed - Valid encounter within last 12 months    Recent Outpatient Visits           2 months ago Essential (primary) hypertension   [place] [facility] at [facility], Leita DEL, MD   6 months ago Type II diabetes mellitus with  complication [facility])   [place] [facility] at [facility], Leita DEL, MD

## 2024-09-30 ENCOUNTER — Other Ambulatory Visit: Payer: Self-pay | Admitting: Internal Medicine

## 2024-09-30 DIAGNOSIS — E118 Type 2 diabetes mellitus with unspecified complications: Secondary | ICD-10-CM

## 2024-10-03 NOTE — Telephone Encounter (Signed)
 Requested Prescriptions  Pending Prescriptions Disp Refills   OZEMPIC , 0.25 OR 0.5 MG/DOSE, 2 MG/3ML SOPN [Pharmacy Med Name: OZEMPIC  0.25 OR 0.5MG  DOS(2MG /3ML)] 3 mL 2    Sig: INJECT 0.5 MG INTO THE SKIN ONE DAY A WEEK     Endocrinology:  Diabetes - GLP-1 Receptor Agonists - semaglutide  Failed - 10/03/2024  4:31 PM      Failed - HBA1C in normal range and within 180 days    Hgb A1c MFr Bld  Date Value Ref Range Status  06/16/2024 5.9 (H) 4.8 - 5.6 % Final    Comment:             Prediabetes: 5.7 - 6.4          Diabetes: >6.4          Glycemic control for adults with diabetes: <7.0          Failed - Cr in normal range and within 360 days    Creatinine, Ser  Date Value Ref Range Status  06/16/2024 1.09 (H) 0.57 - 1.00 mg/dL Final         Passed - Valid encounter within last 6 months    Recent Outpatient Visits           3 months ago Essential (primary) hypertension   [place] [facility] at [facility], Leita DEL, MD   7 months ago Type II diabetes mellitus with complication [facility])   [place] [facility] at [facility], Leita DEL, MD

## 2024-10-17 ENCOUNTER — Encounter: Payer: Self-pay | Admitting: Internal Medicine

## 2024-10-17 ENCOUNTER — Ambulatory Visit: Admitting: Internal Medicine

## 2024-10-17 VITALS — BP 128/68 | HR 82 | Ht 68.0 in | Wt 178.0 lb

## 2024-10-17 DIAGNOSIS — I10 Essential (primary) hypertension: Secondary | ICD-10-CM | POA: Diagnosis not present

## 2024-10-17 DIAGNOSIS — Z23 Encounter for immunization: Secondary | ICD-10-CM | POA: Diagnosis not present

## 2024-10-17 DIAGNOSIS — E118 Type 2 diabetes mellitus with unspecified complications: Secondary | ICD-10-CM

## 2024-10-17 DIAGNOSIS — Z7985 Long-term (current) use of injectable non-insulin antidiabetic drugs: Secondary | ICD-10-CM

## 2024-10-17 LAB — POCT GLYCOSYLATED HEMOGLOBIN (HGB A1C): Hemoglobin A1C: 6.8 % — AB (ref 4.0–5.6)

## 2024-10-17 NOTE — Patient Instructions (Signed)
 Call [facility] to schedule your mammogram at [phone].

## 2024-10-17 NOTE — Assessment & Plan Note (Addendum)
 Currently medications are MTF and Ozempic .  No hypoglycemic episodes noted. Home blood sugars are not being measured. Last visit medical regimen changes were to stop glimepiride. Lab Results  Component Value Date   HGBA1C 6.8 (A) 10/17/2024  A1C today is good.  Continue same regimen.

## 2024-10-17 NOTE — Progress Notes (Signed)
 "   Date:  10/17/2024   Name:  Kimberly Salazar   DOB:  [birth year]   MRN:  [id number]   Chief Complaint: Follow-up (HTN, DM )  Diabetes She presents for her follow-up diabetic visit. She has type 2 diabetes mellitus. Her disease course has been stable. Pertinent negatives for hypoglycemia include no dizziness, headaches or nervousness/anxiousness. Pertinent negatives for diabetes include no chest pain, no fatigue and no weakness. Pertinent negatives for diabetic complications include no CVA. Current diabetic treatments: MTF and ozempic .  Hypertension This is a chronic problem. The problem is controlled. Pertinent negatives include no chest pain, headaches, palpitations or shortness of breath. Past treatments include angiotensin blockers, beta blockers and diuretics. There are no compliance problems.  There is no history of kidney disease, CAD/MI or CVA.    Review of Systems  Constitutional:  Negative for fatigue and unexpected weight change.  HENT:  Negative for trouble swallowing.   Eyes:  Negative for visual disturbance.  Respiratory:  Negative for cough, chest tightness, shortness of breath and wheezing.   Cardiovascular:  Negative for chest pain, palpitations and leg swelling.  Gastrointestinal:  Negative for abdominal pain, constipation and diarrhea.  Musculoskeletal:  Negative for arthralgias and myalgias.  Neurological:  Negative for dizziness, weakness, light-headedness and headaches.  Psychiatric/Behavioral:  Negative for dysphoric mood and sleep disturbance. The patient is not nervous/anxious.      Lab Results  Component Value Date   NA 139 06/16/2024   K 4.7 06/16/2024   CO2 19 (L) 06/16/2024   GLUCOSE 184 (H) 06/16/2024   BUN 25 06/16/2024   CREATININE 1.09 (H) 06/16/2024   CALCIUM 10.3 06/16/2024   EGFR 52 (L) 06/16/2024   GFRNONAA 57 (L) 09/13/2020   Lab Results  Component Value Date   CHOL 131 06/16/2024   HDL 56 06/16/2024   LDLCALC 49 06/16/2024   TRIG 156 (H)  06/16/2024   CHOLHDL 2.3 06/16/2024   Lab Results  Component Value Date   TSH 3.390 06/16/2024   Lab Results  Component Value Date   HGBA1C 6.8 (A) 10/17/2024   Lab Results  Component Value Date   WBC 6.7 06/16/2024   HGB 13.7 06/16/2024   HCT 42.0 06/16/2024   MCV 94 06/16/2024   PLT 173 06/16/2024   Lab Results  Component Value Date   ALT 17 06/16/2024   AST 26 06/16/2024   ALKPHOS 61 06/16/2024   BILITOT 0.6 06/16/2024   No results found for: MARIEN BOLLS, VD25OH   Patient Active Problem List   Diagnosis Date Noted   Sick sinus syndrome (HCC) 02/09/2023   Ovarian failure 04/09/2017   Basal cell carcinoma, leg 07/15/2016   Abnormal finding on thallium stress test 05/17/2015   Hyperlipidemia associated with type 2 diabetes mellitus (HCC) 04/12/2015   Essential (primary) hypertension 04/12/2015   History of uterine cancer 04/12/2015   Type II diabetes mellitus with complication (HCC) 04/12/2015   Cardiac pacemaker in situ 10/11/2009    Allergies[1]  Past Surgical History:  Procedure Laterality Date   ABDOMINAL HYSTERECTOMY  10/27/1992   Uterine CA   APPENDECTOMY     cataract Right 04/2023   CATARACT EXTRACTION Left 10/27/2009   LITHOTRIPSY  10/28/2003   PACEMAKER INSERTION  10/27/2008    Social History[2]   Medication list has been reviewed and updated.  Active Medications[3]     10/17/2024    3:10 PM 06/16/2024    8:36 AM 02/10/2024    9:49 AM 10/12/2023   11:04  AM  GAD 7 : Generalized Anxiety Score  Nervous, Anxious, on Edge 0 0 0 0  Control/stop worrying 0 0 0 0  Worry too much - different things 0 0 0 0  Trouble relaxing 0 0 0 0  Restless 0 0 0 0  Easily annoyed or irritable 0 0 0 0  Afraid - awful might happen 0 0 0 0  Total GAD 7 Score 0 0 0 0  Anxiety Difficulty Not difficult at all Not difficult at all Not difficult at all Not difficult at all       10/17/2024    3:10 PM 06/16/2024    8:36 AM 02/11/2024    3:19 PM   Depression screen PHQ 2/9  Decreased Interest 0 0 0  Down, Depressed, Hopeless 0 0 0  PHQ - 2 Score 0 0 0  Altered sleeping 0 0 0  Tired, decreased energy 1 0 0  Change in appetite 0 0 0  Feeling bad or failure about yourself  0 0 0  Trouble concentrating 0 0 0  Moving slowly or fidgety/restless 0 0 0  Suicidal thoughts 0 0 0  PHQ-9 Score 1 0  0   Difficult doing work/chores Not difficult at all Not difficult at all Not difficult at all     Data saved with a previous flowsheet row definition    BP Readings from Last 3 Encounters:  10/17/24 128/68  06/16/24 122/66  02/10/24 114/68    Physical Exam Vitals and nursing note reviewed.  Constitutional:      General: She is not in acute distress.    Appearance: She is well-developed.  HENT:     Head: Normocephalic and atraumatic.  Cardiovascular:     Rate and Rhythm: Normal rate and regular rhythm.  Pulmonary:     Effort: Pulmonary effort is normal. No respiratory distress.     Breath sounds: No wheezing or rhonchi.  Musculoskeletal:     Right lower leg: No edema.     Left lower leg: No edema.  Skin:    General: Skin is warm and dry.     Findings: No rash.  Neurological:     Mental Status: She is alert and oriented to person, place, and time.  Psychiatric:        Mood and Affect: Mood normal.        Behavior: Behavior normal.     Wt Readings from Last 3 Encounters:  10/17/24 178 lb (80.7 kg)  06/16/24 176 lb (79.8 kg)  02/11/24 176 lb (79.8 kg)    BP 128/68   Pulse 82   Ht 5' 8 (1.727 m)   Wt 178 lb (80.7 kg)   SpO2 97%   BMI 27.06 kg/m   Assessment and Plan:  Problem List Items Addressed This Visit       Unprioritized   Essential (primary) hypertension (Chronic)   BP controlled. Continue Olmesartan , hydrochlorothiazide and metoprolol .       Type II diabetes mellitus with complication (HCC) - Primary (Chronic)   Currently medications are MTF and Ozempic .  No hypoglycemic episodes noted. Home  blood sugars are not being measured. Last visit medical regimen changes were to stop glimepiride. Lab Results  Component Value Date   HGBA1C 6.8 (A) 10/17/2024  A1C today is good.  Continue same regimen.       Relevant Orders   POCT HgB A1C (Completed)   Other Visit Diagnoses       Needs flu shot  Relevant Orders   Flu vaccine HIGH DOSE PF(Fluzone Trivalent) (Completed)     Long-term current use of injectable noninsulin antidiabetic medication           Return in about 4 months (around 02/15/2025) for DM, HTN.    Leita HILARIO Adie, MD [facility] [place]           [1] No Known Allergies [2]  Social History Tobacco Use   Smoking status: Never    Passive exposure: Never   Smokeless tobacco: Never  Vaping Use   Vaping status: Never Used  Substance Use Topics   Alcohol use: No    Alcohol/week: 0.0 standard drinks of alcohol   Drug use: No  [3]  Current Meds  Medication Sig   aspirin 81 MG chewable tablet Chew 1 tablet by mouth daily.   cholecalciferol (VITAMIN D3) 25 MCG (1000 UT) tablet Take 1,000 Units by mouth daily.   glucose blood (ONE TOUCH ULTRA TEST) test strip 1 each by Other route 2 (two) times daily.   metFORMIN  (GLUCOPHAGE -XR) 500 MG 24 hr tablet Take 2 tablets (1,000 mg total) by mouth 2 (two) times daily with a meal.   metoprolol  succinate (TOPROL -XL) 25 MG 24 hr tablet Take 1 tablet (25 mg total) by mouth daily.   Multiple Vitamins-Minerals (MULTIVITAL) tablet Take 1 tablet by mouth daily.   olmesartan -hydrochlorothiazide (BENICAR  HCT) 20-12.5 MG tablet TAKE 1 TABLET BY MOUTH DAILY   omega-3 acid ethyl esters (LOVAZA) 1 g capsule TAKE 4 CAPSULES BY MOUTH EVERY DAY   OZEMPIC , 0.25 OR 0.5 MG/DOSE, 2 MG/3ML SOPN INJECT 0.5 MG INTO THE SKIN ONE DAY A WEEK   simvastatin  (ZOCOR ) 80 MG tablet TAKE 1 TABLET(80 MG) BY MOUTH DAILY   "

## 2024-10-17 NOTE — Assessment & Plan Note (Addendum)
 BP controlled. Continue Olmesartan , hydrochlorothiazide and metoprolol .

## 2024-11-15 ENCOUNTER — Other Ambulatory Visit: Payer: Self-pay

## 2024-11-15 DIAGNOSIS — I10 Essential (primary) hypertension: Secondary | ICD-10-CM

## 2024-11-15 DIAGNOSIS — E1169 Type 2 diabetes mellitus with other specified complication: Secondary | ICD-10-CM

## 2024-11-15 MED ORDER — METOPROLOL SUCCINATE ER 25 MG PO TB24: 25.0000 mg | ORAL_TABLET | Freq: Every day | ORAL | 1 refills | Status: AC

## 2024-11-15 MED ORDER — SIMVASTATIN 80 MG PO TABS: ORAL_TABLET | ORAL | 1 refills | Status: AC

## 2025-02-15 ENCOUNTER — Ambulatory Visit: Admitting: Student

## 2025-02-23 ENCOUNTER — Ambulatory Visit
